# Patient Record
Sex: Female | Born: 1935
Health system: Southern US, Community
[De-identification: ages and names within clinical notes are randomized; demographics above are authoritative.]

## PROBLEM LIST (undated history)

## (undated) DIAGNOSIS — R011 Cardiac murmur, unspecified: Secondary | ICD-10-CM

## (undated) DIAGNOSIS — Z9981 Dependence on supplemental oxygen: Secondary | ICD-10-CM

## (undated) DIAGNOSIS — E119 Type 2 diabetes mellitus without complications: Secondary | ICD-10-CM

## (undated) DIAGNOSIS — C4490 Unspecified malignant neoplasm of skin, unspecified: Secondary | ICD-10-CM

## (undated) DIAGNOSIS — M109 Gout, unspecified: Secondary | ICD-10-CM

## (undated) DIAGNOSIS — M1990 Unspecified osteoarthritis, unspecified site: Secondary | ICD-10-CM

## (undated) DIAGNOSIS — E7800 Pure hypercholesterolemia, unspecified: Secondary | ICD-10-CM

## (undated) DIAGNOSIS — N184 Chronic kidney disease, stage 4 (severe): Secondary | ICD-10-CM

## (undated) DIAGNOSIS — D649 Anemia, unspecified: Secondary | ICD-10-CM

## (undated) DIAGNOSIS — E079 Disorder of thyroid, unspecified: Secondary | ICD-10-CM

## (undated) DIAGNOSIS — I509 Heart failure, unspecified: Secondary | ICD-10-CM

## (undated) DIAGNOSIS — I4891 Unspecified atrial fibrillation: Secondary | ICD-10-CM

## (undated) DIAGNOSIS — I38 Endocarditis, valve unspecified: Secondary | ICD-10-CM

## (undated) DIAGNOSIS — I10 Essential (primary) hypertension: Secondary | ICD-10-CM

## (undated) DIAGNOSIS — J189 Pneumonia, unspecified organism: Secondary | ICD-10-CM

## (undated) DIAGNOSIS — E039 Hypothyroidism, unspecified: Secondary | ICD-10-CM

## (undated) HISTORY — PX: ABDOMINAL HYSTERECTOMY: SHX81

## (undated) HISTORY — PX: TOTAL THYROIDECTOMY: SHX2547

---

## 2011-05-19 DIAGNOSIS — L570 Actinic keratosis: Secondary | ICD-10-CM | POA: Diagnosis not present

## 2011-05-19 DIAGNOSIS — I2510 Atherosclerotic heart disease of native coronary artery without angina pectoris: Secondary | ICD-10-CM | POA: Diagnosis not present

## 2011-06-30 DIAGNOSIS — I369 Nonrheumatic tricuspid valve disorder, unspecified: Secondary | ICD-10-CM | POA: Diagnosis not present

## 2011-06-30 DIAGNOSIS — J984 Other disorders of lung: Secondary | ICD-10-CM | POA: Diagnosis not present

## 2011-06-30 DIAGNOSIS — I059 Rheumatic mitral valve disease, unspecified: Secondary | ICD-10-CM | POA: Diagnosis not present

## 2011-06-30 DIAGNOSIS — I2510 Atherosclerotic heart disease of native coronary artery without angina pectoris: Secondary | ICD-10-CM | POA: Diagnosis not present

## 2011-07-01 DIAGNOSIS — E785 Hyperlipidemia, unspecified: Secondary | ICD-10-CM | POA: Diagnosis not present

## 2011-07-01 DIAGNOSIS — I2510 Atherosclerotic heart disease of native coronary artery without angina pectoris: Secondary | ICD-10-CM | POA: Diagnosis not present

## 2011-07-01 DIAGNOSIS — R0602 Shortness of breath: Secondary | ICD-10-CM | POA: Diagnosis not present

## 2011-07-01 DIAGNOSIS — M259 Joint disorder, unspecified: Secondary | ICD-10-CM | POA: Diagnosis not present

## 2011-07-01 DIAGNOSIS — R079 Chest pain, unspecified: Secondary | ICD-10-CM | POA: Diagnosis not present

## 2011-07-02 DIAGNOSIS — I4949 Other premature depolarization: Secondary | ICD-10-CM | POA: Diagnosis not present

## 2011-07-02 DIAGNOSIS — R002 Palpitations: Secondary | ICD-10-CM | POA: Diagnosis not present

## 2011-07-02 DIAGNOSIS — I491 Atrial premature depolarization: Secondary | ICD-10-CM | POA: Diagnosis not present

## 2011-07-06 DIAGNOSIS — E785 Hyperlipidemia, unspecified: Secondary | ICD-10-CM | POA: Diagnosis not present

## 2011-07-06 DIAGNOSIS — Z8249 Family history of ischemic heart disease and other diseases of the circulatory system: Secondary | ICD-10-CM | POA: Diagnosis not present

## 2011-07-06 DIAGNOSIS — I10 Essential (primary) hypertension: Secondary | ICD-10-CM | POA: Diagnosis not present

## 2011-07-06 DIAGNOSIS — I2510 Atherosclerotic heart disease of native coronary artery without angina pectoris: Secondary | ICD-10-CM | POA: Diagnosis not present

## 2011-07-06 DIAGNOSIS — J9819 Other pulmonary collapse: Secondary | ICD-10-CM | POA: Diagnosis not present

## 2011-07-06 DIAGNOSIS — I517 Cardiomegaly: Secondary | ICD-10-CM | POA: Diagnosis not present

## 2011-07-06 DIAGNOSIS — R079 Chest pain, unspecified: Secondary | ICD-10-CM | POA: Diagnosis not present

## 2011-07-11 DIAGNOSIS — N189 Chronic kidney disease, unspecified: Secondary | ICD-10-CM | POA: Diagnosis not present

## 2011-07-11 DIAGNOSIS — E785 Hyperlipidemia, unspecified: Secondary | ICD-10-CM | POA: Diagnosis not present

## 2011-07-11 DIAGNOSIS — E119 Type 2 diabetes mellitus without complications: Secondary | ICD-10-CM | POA: Diagnosis not present

## 2011-07-11 DIAGNOSIS — I129 Hypertensive chronic kidney disease with stage 1 through stage 4 chronic kidney disease, or unspecified chronic kidney disease: Secondary | ICD-10-CM | POA: Diagnosis not present

## 2011-07-18 DIAGNOSIS — E782 Mixed hyperlipidemia: Secondary | ICD-10-CM | POA: Diagnosis not present

## 2011-07-18 DIAGNOSIS — E119 Type 2 diabetes mellitus without complications: Secondary | ICD-10-CM | POA: Diagnosis not present

## 2011-07-18 DIAGNOSIS — E039 Hypothyroidism, unspecified: Secondary | ICD-10-CM | POA: Diagnosis not present

## 2011-07-18 DIAGNOSIS — I10 Essential (primary) hypertension: Secondary | ICD-10-CM | POA: Diagnosis not present

## 2011-08-01 DIAGNOSIS — I2510 Atherosclerotic heart disease of native coronary artery without angina pectoris: Secondary | ICD-10-CM | POA: Diagnosis not present

## 2011-08-01 DIAGNOSIS — E119 Type 2 diabetes mellitus without complications: Secondary | ICD-10-CM | POA: Diagnosis not present

## 2011-08-01 DIAGNOSIS — I10 Essential (primary) hypertension: Secondary | ICD-10-CM | POA: Diagnosis not present

## 2011-08-04 DIAGNOSIS — R079 Chest pain, unspecified: Secondary | ICD-10-CM | POA: Diagnosis not present

## 2011-08-22 DIAGNOSIS — J449 Chronic obstructive pulmonary disease, unspecified: Secondary | ICD-10-CM | POA: Diagnosis not present

## 2011-08-22 DIAGNOSIS — J189 Pneumonia, unspecified organism: Secondary | ICD-10-CM | POA: Diagnosis present

## 2011-08-22 DIAGNOSIS — J441 Chronic obstructive pulmonary disease with (acute) exacerbation: Secondary | ICD-10-CM | POA: Diagnosis not present

## 2011-08-22 DIAGNOSIS — I10 Essential (primary) hypertension: Secondary | ICD-10-CM | POA: Diagnosis not present

## 2011-08-22 DIAGNOSIS — E119 Type 2 diabetes mellitus without complications: Secondary | ICD-10-CM | POA: Diagnosis not present

## 2011-08-22 DIAGNOSIS — M1990 Unspecified osteoarthritis, unspecified site: Secondary | ICD-10-CM | POA: Diagnosis present

## 2011-08-22 DIAGNOSIS — K5900 Constipation, unspecified: Secondary | ICD-10-CM | POA: Diagnosis not present

## 2011-08-22 DIAGNOSIS — E039 Hypothyroidism, unspecified: Secondary | ICD-10-CM | POA: Diagnosis present

## 2011-08-27 DIAGNOSIS — N189 Chronic kidney disease, unspecified: Secondary | ICD-10-CM | POA: Diagnosis present

## 2011-08-27 DIAGNOSIS — I509 Heart failure, unspecified: Secondary | ICD-10-CM | POA: Diagnosis not present

## 2011-08-27 DIAGNOSIS — J441 Chronic obstructive pulmonary disease with (acute) exacerbation: Secondary | ICD-10-CM | POA: Diagnosis not present

## 2011-08-27 DIAGNOSIS — R05 Cough: Secondary | ICD-10-CM | POA: Diagnosis not present

## 2011-08-27 DIAGNOSIS — I519 Heart disease, unspecified: Secondary | ICD-10-CM | POA: Diagnosis not present

## 2011-08-27 DIAGNOSIS — J449 Chronic obstructive pulmonary disease, unspecified: Secondary | ICD-10-CM | POA: Diagnosis not present

## 2011-08-27 DIAGNOSIS — I2510 Atherosclerotic heart disease of native coronary artery without angina pectoris: Secondary | ICD-10-CM | POA: Diagnosis present

## 2011-08-27 DIAGNOSIS — I4891 Unspecified atrial fibrillation: Secondary | ICD-10-CM | POA: Diagnosis not present

## 2011-08-27 DIAGNOSIS — M1990 Unspecified osteoarthritis, unspecified site: Secondary | ICD-10-CM | POA: Diagnosis present

## 2011-08-27 DIAGNOSIS — I499 Cardiac arrhythmia, unspecified: Secondary | ICD-10-CM | POA: Diagnosis not present

## 2011-08-27 DIAGNOSIS — N289 Disorder of kidney and ureter, unspecified: Secondary | ICD-10-CM | POA: Diagnosis not present

## 2011-08-27 DIAGNOSIS — R9431 Abnormal electrocardiogram [ECG] [EKG]: Secondary | ICD-10-CM | POA: Diagnosis not present

## 2011-08-27 DIAGNOSIS — K5900 Constipation, unspecified: Secondary | ICD-10-CM | POA: Diagnosis not present

## 2011-08-27 DIAGNOSIS — R0609 Other forms of dyspnea: Secondary | ICD-10-CM | POA: Diagnosis not present

## 2011-08-27 DIAGNOSIS — R0602 Shortness of breath: Secondary | ICD-10-CM | POA: Diagnosis not present

## 2011-08-27 DIAGNOSIS — E119 Type 2 diabetes mellitus without complications: Secondary | ICD-10-CM | POA: Diagnosis present

## 2011-08-27 DIAGNOSIS — J4489 Other specified chronic obstructive pulmonary disease: Secondary | ICD-10-CM | POA: Diagnosis not present

## 2011-08-27 DIAGNOSIS — E785 Hyperlipidemia, unspecified: Secondary | ICD-10-CM | POA: Diagnosis present

## 2011-08-27 DIAGNOSIS — R5381 Other malaise: Secondary | ICD-10-CM | POA: Diagnosis not present

## 2011-08-27 DIAGNOSIS — K5909 Other constipation: Secondary | ICD-10-CM | POA: Diagnosis present

## 2011-08-27 DIAGNOSIS — E860 Dehydration: Secondary | ICD-10-CM | POA: Diagnosis present

## 2011-08-27 DIAGNOSIS — I129 Hypertensive chronic kidney disease with stage 1 through stage 4 chronic kidney disease, or unspecified chronic kidney disease: Secondary | ICD-10-CM | POA: Diagnosis present

## 2011-08-27 DIAGNOSIS — I498 Other specified cardiac arrhythmias: Secondary | ICD-10-CM | POA: Diagnosis not present

## 2011-08-27 DIAGNOSIS — Z87891 Personal history of nicotine dependence: Secondary | ICD-10-CM | POA: Diagnosis not present

## 2011-08-31 DIAGNOSIS — R5381 Other malaise: Secondary | ICD-10-CM | POA: Diagnosis not present

## 2011-08-31 DIAGNOSIS — I509 Heart failure, unspecified: Secondary | ICD-10-CM | POA: Diagnosis not present

## 2011-08-31 DIAGNOSIS — M1990 Unspecified osteoarthritis, unspecified site: Secondary | ICD-10-CM | POA: Diagnosis not present

## 2011-08-31 DIAGNOSIS — E119 Type 2 diabetes mellitus without complications: Secondary | ICD-10-CM | POA: Diagnosis not present

## 2011-08-31 DIAGNOSIS — R5383 Other fatigue: Secondary | ICD-10-CM | POA: Diagnosis not present

## 2011-08-31 DIAGNOSIS — E7800 Pure hypercholesterolemia, unspecified: Secondary | ICD-10-CM | POA: Diagnosis not present

## 2011-08-31 DIAGNOSIS — J441 Chronic obstructive pulmonary disease with (acute) exacerbation: Secondary | ICD-10-CM | POA: Diagnosis not present

## 2011-09-02 DIAGNOSIS — I4891 Unspecified atrial fibrillation: Secondary | ICD-10-CM | POA: Diagnosis not present

## 2011-09-02 DIAGNOSIS — Z7901 Long term (current) use of anticoagulants: Secondary | ICD-10-CM | POA: Diagnosis not present

## 2011-09-02 DIAGNOSIS — Z5181 Encounter for therapeutic drug level monitoring: Secondary | ICD-10-CM | POA: Diagnosis not present

## 2011-09-07 DIAGNOSIS — I498 Other specified cardiac arrhythmias: Secondary | ICD-10-CM | POA: Diagnosis not present

## 2011-09-07 DIAGNOSIS — I10 Essential (primary) hypertension: Secondary | ICD-10-CM | POA: Diagnosis not present

## 2011-09-07 DIAGNOSIS — I059 Rheumatic mitral valve disease, unspecified: Secondary | ICD-10-CM | POA: Diagnosis not present

## 2011-09-07 DIAGNOSIS — I4891 Unspecified atrial fibrillation: Secondary | ICD-10-CM | POA: Diagnosis not present

## 2011-09-07 DIAGNOSIS — I509 Heart failure, unspecified: Secondary | ICD-10-CM | POA: Diagnosis not present

## 2011-09-07 DIAGNOSIS — R609 Edema, unspecified: Secondary | ICD-10-CM | POA: Diagnosis not present

## 2011-09-09 DIAGNOSIS — R5383 Other fatigue: Secondary | ICD-10-CM | POA: Diagnosis not present

## 2011-09-09 DIAGNOSIS — Z23 Encounter for immunization: Secondary | ICD-10-CM | POA: Diagnosis not present

## 2011-09-09 DIAGNOSIS — N189 Chronic kidney disease, unspecified: Secondary | ICD-10-CM | POA: Diagnosis not present

## 2011-09-09 DIAGNOSIS — I10 Essential (primary) hypertension: Secondary | ICD-10-CM | POA: Diagnosis not present

## 2011-09-09 DIAGNOSIS — K219 Gastro-esophageal reflux disease without esophagitis: Secondary | ICD-10-CM | POA: Diagnosis not present

## 2011-09-09 DIAGNOSIS — D6489 Other specified anemias: Secondary | ICD-10-CM | POA: Diagnosis not present

## 2011-09-09 DIAGNOSIS — E039 Hypothyroidism, unspecified: Secondary | ICD-10-CM | POA: Diagnosis not present

## 2011-09-09 DIAGNOSIS — E119 Type 2 diabetes mellitus without complications: Secondary | ICD-10-CM | POA: Diagnosis not present

## 2011-09-09 DIAGNOSIS — E669 Obesity, unspecified: Secondary | ICD-10-CM | POA: Diagnosis not present

## 2011-09-09 DIAGNOSIS — Z7901 Long term (current) use of anticoagulants: Secondary | ICD-10-CM | POA: Diagnosis not present

## 2011-09-09 DIAGNOSIS — R5381 Other malaise: Secondary | ICD-10-CM | POA: Diagnosis not present

## 2011-09-09 DIAGNOSIS — Z79899 Other long term (current) drug therapy: Secondary | ICD-10-CM | POA: Diagnosis not present

## 2011-09-14 DIAGNOSIS — I498 Other specified cardiac arrhythmias: Secondary | ICD-10-CM | POA: Diagnosis not present

## 2011-09-14 DIAGNOSIS — I509 Heart failure, unspecified: Secondary | ICD-10-CM | POA: Diagnosis not present

## 2011-09-14 DIAGNOSIS — J449 Chronic obstructive pulmonary disease, unspecified: Secondary | ICD-10-CM | POA: Diagnosis not present

## 2011-09-14 DIAGNOSIS — I4891 Unspecified atrial fibrillation: Secondary | ICD-10-CM | POA: Diagnosis not present

## 2011-09-15 DIAGNOSIS — I4891 Unspecified atrial fibrillation: Secondary | ICD-10-CM | POA: Diagnosis not present

## 2011-09-16 DIAGNOSIS — H2589 Other age-related cataract: Secondary | ICD-10-CM | POA: Diagnosis not present

## 2011-09-16 DIAGNOSIS — E119 Type 2 diabetes mellitus without complications: Secondary | ICD-10-CM | POA: Diagnosis not present

## 2011-09-16 DIAGNOSIS — H04129 Dry eye syndrome of unspecified lacrimal gland: Secondary | ICD-10-CM | POA: Diagnosis not present

## 2011-09-22 DIAGNOSIS — I4891 Unspecified atrial fibrillation: Secondary | ICD-10-CM | POA: Diagnosis not present

## 2011-09-28 DIAGNOSIS — I509 Heart failure, unspecified: Secondary | ICD-10-CM | POA: Diagnosis not present

## 2011-09-28 DIAGNOSIS — I10 Essential (primary) hypertension: Secondary | ICD-10-CM | POA: Diagnosis not present

## 2011-09-28 DIAGNOSIS — I4891 Unspecified atrial fibrillation: Secondary | ICD-10-CM | POA: Diagnosis not present

## 2011-09-28 DIAGNOSIS — I059 Rheumatic mitral valve disease, unspecified: Secondary | ICD-10-CM | POA: Diagnosis not present

## 2011-09-29 DIAGNOSIS — N184 Chronic kidney disease, stage 4 (severe): Secondary | ICD-10-CM | POA: Diagnosis not present

## 2011-10-11 DIAGNOSIS — I4891 Unspecified atrial fibrillation: Secondary | ICD-10-CM | POA: Diagnosis not present

## 2011-10-14 DIAGNOSIS — I10 Essential (primary) hypertension: Secondary | ICD-10-CM | POA: Diagnosis not present

## 2011-10-14 DIAGNOSIS — Z79899 Other long term (current) drug therapy: Secondary | ICD-10-CM | POA: Diagnosis not present

## 2011-10-14 DIAGNOSIS — Z1331 Encounter for screening for depression: Secondary | ICD-10-CM | POA: Diagnosis not present

## 2011-10-14 DIAGNOSIS — Z1339 Encounter for screening examination for other mental health and behavioral disorders: Secondary | ICD-10-CM | POA: Diagnosis not present

## 2011-10-14 DIAGNOSIS — E119 Type 2 diabetes mellitus without complications: Secondary | ICD-10-CM | POA: Diagnosis not present

## 2011-10-14 DIAGNOSIS — Z136 Encounter for screening for cardiovascular disorders: Secondary | ICD-10-CM | POA: Diagnosis not present

## 2011-10-14 DIAGNOSIS — E785 Hyperlipidemia, unspecified: Secondary | ICD-10-CM | POA: Diagnosis not present

## 2011-10-14 DIAGNOSIS — D508 Other iron deficiency anemias: Secondary | ICD-10-CM | POA: Diagnosis not present

## 2011-10-15 DIAGNOSIS — I509 Heart failure, unspecified: Secondary | ICD-10-CM | POA: Diagnosis not present

## 2011-10-15 DIAGNOSIS — M1990 Unspecified osteoarthritis, unspecified site: Secondary | ICD-10-CM | POA: Diagnosis not present

## 2011-10-15 DIAGNOSIS — J441 Chronic obstructive pulmonary disease with (acute) exacerbation: Secondary | ICD-10-CM | POA: Diagnosis not present

## 2011-10-15 DIAGNOSIS — E119 Type 2 diabetes mellitus without complications: Secondary | ICD-10-CM | POA: Diagnosis not present

## 2011-10-25 DIAGNOSIS — I4891 Unspecified atrial fibrillation: Secondary | ICD-10-CM | POA: Diagnosis not present

## 2011-11-14 DIAGNOSIS — I517 Cardiomegaly: Secondary | ICD-10-CM | POA: Diagnosis not present

## 2011-11-14 DIAGNOSIS — I4891 Unspecified atrial fibrillation: Secondary | ICD-10-CM | POA: Diagnosis not present

## 2011-11-14 DIAGNOSIS — I059 Rheumatic mitral valve disease, unspecified: Secondary | ICD-10-CM | POA: Diagnosis not present

## 2011-11-14 DIAGNOSIS — I080 Rheumatic disorders of both mitral and aortic valves: Secondary | ICD-10-CM | POA: Diagnosis not present

## 2011-11-14 DIAGNOSIS — I079 Rheumatic tricuspid valve disease, unspecified: Secondary | ICD-10-CM | POA: Diagnosis not present

## 2011-11-14 DIAGNOSIS — R002 Palpitations: Secondary | ICD-10-CM | POA: Diagnosis not present

## 2011-11-21 DIAGNOSIS — R002 Palpitations: Secondary | ICD-10-CM | POA: Diagnosis not present

## 2011-11-23 DIAGNOSIS — I509 Heart failure, unspecified: Secondary | ICD-10-CM | POA: Diagnosis not present

## 2011-11-23 DIAGNOSIS — I059 Rheumatic mitral valve disease, unspecified: Secondary | ICD-10-CM | POA: Diagnosis not present

## 2011-11-23 DIAGNOSIS — I4891 Unspecified atrial fibrillation: Secondary | ICD-10-CM | POA: Diagnosis not present

## 2011-11-23 DIAGNOSIS — I10 Essential (primary) hypertension: Secondary | ICD-10-CM | POA: Diagnosis not present

## 2011-11-23 DIAGNOSIS — I2789 Other specified pulmonary heart diseases: Secondary | ICD-10-CM | POA: Diagnosis not present

## 2011-12-15 DIAGNOSIS — I4891 Unspecified atrial fibrillation: Secondary | ICD-10-CM | POA: Diagnosis not present

## 2011-12-19 DIAGNOSIS — N184 Chronic kidney disease, stage 4 (severe): Secondary | ICD-10-CM | POA: Diagnosis not present

## 2011-12-26 DIAGNOSIS — N2581 Secondary hyperparathyroidism of renal origin: Secondary | ICD-10-CM | POA: Diagnosis not present

## 2011-12-26 DIAGNOSIS — N185 Chronic kidney disease, stage 5: Secondary | ICD-10-CM | POA: Diagnosis not present

## 2011-12-26 DIAGNOSIS — N184 Chronic kidney disease, stage 4 (severe): Secondary | ICD-10-CM | POA: Diagnosis not present

## 2011-12-26 DIAGNOSIS — D649 Anemia, unspecified: Secondary | ICD-10-CM | POA: Diagnosis not present

## 2012-01-05 DIAGNOSIS — N184 Chronic kidney disease, stage 4 (severe): Secondary | ICD-10-CM | POA: Diagnosis not present

## 2012-01-06 DIAGNOSIS — I4891 Unspecified atrial fibrillation: Secondary | ICD-10-CM | POA: Diagnosis not present

## 2012-01-11 ENCOUNTER — Other Ambulatory Visit: Payer: Self-pay | Admitting: Nephrology

## 2012-01-11 DIAGNOSIS — N1330 Unspecified hydronephrosis: Secondary | ICD-10-CM

## 2012-01-12 ENCOUNTER — Ambulatory Visit
Admission: RE | Admit: 2012-01-12 | Discharge: 2012-01-12 | Disposition: A | Discharge status: Home / Self Care | Payer: Medicare Other | Source: Ambulatory Visit | Attending: Nephrology | Admitting: Nephrology

## 2012-01-12 DIAGNOSIS — N1330 Unspecified hydronephrosis: Secondary | ICD-10-CM | POA: Diagnosis not present

## 2012-01-16 DIAGNOSIS — N1330 Unspecified hydronephrosis: Secondary | ICD-10-CM | POA: Diagnosis not present

## 2012-01-16 DIAGNOSIS — N281 Cyst of kidney, acquired: Secondary | ICD-10-CM | POA: Diagnosis not present

## 2012-02-02 DIAGNOSIS — I4891 Unspecified atrial fibrillation: Secondary | ICD-10-CM | POA: Diagnosis not present

## 2012-02-04 ENCOUNTER — Emergency Department (HOSPITAL_BASED_OUTPATIENT_CLINIC_OR_DEPARTMENT_OTHER): Payer: Medicare Other

## 2012-02-04 ENCOUNTER — Encounter (HOSPITAL_BASED_OUTPATIENT_CLINIC_OR_DEPARTMENT_OTHER): Payer: Self-pay | Admitting: *Deleted

## 2012-02-04 ENCOUNTER — Emergency Department (HOSPITAL_BASED_OUTPATIENT_CLINIC_OR_DEPARTMENT_OTHER)
Admission: EM | Admit: 2012-02-04 | Discharge: 2012-02-04 | Disposition: A | Discharge status: Home / Self Care | Payer: Medicare Other | Attending: Emergency Medicine | Admitting: Emergency Medicine

## 2012-02-04 DIAGNOSIS — Z7901 Long term (current) use of anticoagulants: Secondary | ICD-10-CM | POA: Diagnosis not present

## 2012-02-04 DIAGNOSIS — Y9301 Activity, walking, marching and hiking: Secondary | ICD-10-CM | POA: Insufficient documentation

## 2012-02-04 DIAGNOSIS — I509 Heart failure, unspecified: Secondary | ICD-10-CM | POA: Diagnosis not present

## 2012-02-04 DIAGNOSIS — S5010XA Contusion of unspecified forearm, initial encounter: Secondary | ICD-10-CM | POA: Diagnosis not present

## 2012-02-04 DIAGNOSIS — M79609 Pain in unspecified limb: Secondary | ICD-10-CM | POA: Insufficient documentation

## 2012-02-04 DIAGNOSIS — E079 Disorder of thyroid, unspecified: Secondary | ICD-10-CM | POA: Insufficient documentation

## 2012-02-04 DIAGNOSIS — S0993XA Unspecified injury of face, initial encounter: Secondary | ICD-10-CM | POA: Diagnosis not present

## 2012-02-04 DIAGNOSIS — I10 Essential (primary) hypertension: Secondary | ICD-10-CM | POA: Insufficient documentation

## 2012-02-04 DIAGNOSIS — T148XXA Other injury of unspecified body region, initial encounter: Secondary | ICD-10-CM

## 2012-02-04 DIAGNOSIS — E119 Type 2 diabetes mellitus without complications: Secondary | ICD-10-CM | POA: Insufficient documentation

## 2012-02-04 DIAGNOSIS — S0003XA Contusion of scalp, initial encounter: Secondary | ICD-10-CM | POA: Diagnosis not present

## 2012-02-04 DIAGNOSIS — S199XXA Unspecified injury of neck, initial encounter: Secondary | ICD-10-CM | POA: Diagnosis not present

## 2012-02-04 DIAGNOSIS — W19XXXA Unspecified fall, initial encounter: Secondary | ICD-10-CM | POA: Insufficient documentation

## 2012-02-04 DIAGNOSIS — S1093XA Contusion of unspecified part of neck, initial encounter: Secondary | ICD-10-CM | POA: Diagnosis not present

## 2012-02-04 HISTORY — DX: Disorder of thyroid, unspecified: E07.9

## 2012-02-04 HISTORY — DX: Heart failure, unspecified: I50.9

## 2012-02-04 HISTORY — DX: Essential (primary) hypertension: I10

## 2012-02-04 LAB — PROTIME-INR: INR: 1.9 — ABNORMAL HIGH (ref 0.00–1.49)

## 2012-02-04 MED ORDER — ACETAMINOPHEN 325 MG PO TABS
650.0000 mg | ORAL_TABLET | Freq: Once | ORAL | Status: AC
  Administered 2012-02-04: 650 mg via ORAL
  Filled 2012-02-04: qty 2

## 2012-02-04 NOTE — ED Notes (Signed)
Report recd from [facility]

## 2012-02-04 NOTE — ED Provider Notes (Addendum)
History  This chart was scribed for Madeline Dessert, MD by Roe Coombs. The patient was seen in room MH12/MH12. Patient's care was started at 1758.     CSN: [id number]  Arrival date & time 02/04/12  1731   First MD Initiated Contact with Patient 02/04/12 1758      Chief Complaint  Patient presents with  . Fall   Patient is a 78 y.o. female presenting with fall. The history is provided by the patient. No language interpreter was used.  Fall The accident occurred 1 to 2 hours ago. The fall occurred while walking. She fell from a height of 1 to 2 ft. The point of impact was the head (and right arm). The pain is moderate. She was ambulatory at the scene. Pertinent negatives include no loss of consciousness. She has tried nothing for the symptoms.    HPI Comments Madeline [name] is a 78 y.o. female who presents to the Emergency Department complaining of moderate, constant right forearm and moderate, constant, frontal HA pain resulting from a fall that occurred 1-2 hours ago. Patient states that she was trying to get up from her chair when she had a misstep and fell to the floor. Patient denies any pain in her legs, knees or hips and denies LOC. She states that she was ambulatory after the fall. Patient is on Coumadin.    Past Medical History  Diagnosis Date  . Hypertension   . Diabetes mellitus   . CHF (congestive heart failure)   . Thyroid disease     Past Surgical History  Procedure Date  . Abdominal hysterectomy   . Thyroid surgery     History reviewed. No pertinent family history.  History  Substance Use Topics  . Smoking status: Never Smoker   . Smokeless tobacco: Not on file  . Alcohol Use: No    OB History    Grav Para Term Preterm Abortions TAB SAB Ect Mult Living                  Review of Systems  Constitutional: Negative.   HENT: Negative.   Eyes: Negative.   Respiratory: Negative.   Cardiovascular: Negative.   Gastrointestinal: Negative.     Musculoskeletal: Positive for myalgias.  Skin: Positive for color change and wound.  Neurological: Negative.  Negative for loss of consciousness.  Psychiatric/Behavioral: Negative.     Allergies  Review of patient's allergies indicates no known allergies.  Home Medications  No current outpatient prescriptions on file.  BP 130/56  Pulse 52  Temp 98.1 F (36.7 C) (Oral)  Resp 20  Ht 5\' 5"  (1.651 m)  Wt 209 lb (94.802 kg)  BMI 34.78 kg/m2  SpO2 95%  Physical Exam  Constitutional: She is oriented to person, place, and time. She appears well-developed and well-nourished.  HENT:  Head: Normocephalic.       Hematoma over the mid forehead.  Eyes: Pupils are equal, round, and reactive to light.  Cardiovascular: Normal rate and regular rhythm.   No murmur heard. Pulmonary/Chest: Effort normal and breath sounds normal. No respiratory distress.  Musculoskeletal: Normal range of motion.       Right forearm: She exhibits tenderness. She exhibits no deformity.       Hematoma over the right forearm. No deformity. Mild tenderness. Neurovascularly intact distally without wrist or elbow tenderness.  Neurological: She is alert and oriented to person, place, and time.  Skin: Skin is warm and dry.  Psychiatric: She has a normal mood  and affect. Her behavior is normal.    ED Course  Procedures (including critical care time) DIAGNOSTIC STUDIES: Oxygen Saturation is 95% on room air, adequate by my interpretation.    COORDINATION OF CARE: 6:13pm- Patient informed of current plan for treatment and evaluation and agrees with plan at this time.      Labs Reviewed  PROTIME-INR - Abnormal; Notable for the following:    Prothrombin Time 21.1 (*)     INR 1.90 (*)     All other components within normal limits   Ct Head Wo Contrast  02/04/2012  *RADIOLOGY REPORT*  Clinical Data: [place], pain in the head.  Anticoagulated.  CT HEAD WITHOUT CONTRAST  Technique:  Contiguous axial images were  obtained from the base of the skull through the vertex without contrast.  Comparison: None.  Findings: There is no evidence for acute infarction, intracranial hemorrhage, mass lesion, hydrocephalus, or extra-axial fluid. Moderate atrophy.  Chronic microvascular ischemic change.  Right frontal scalp hematoma.  No skull fracture.  No contrecoup injury. Clear sinuses and mastoids.  Negative orbits.  IMPRESSION: Right frontal scalp hematoma.  No underlying skull fracture or intracranial hemorrhage.   Original Report Authenticated By: Staci Righter, M.D.      1. Fall   2. Hematoma       MDM   Patient with a mechanical fall today hitting her head. Patient is taking Coumadin. Coumadin level was last checked on Thursday was 1.8 and her level was increased. Patient denies any LOC but does have a significant hematoma on her forehead. No C-spine tenderness and a small hematoma to the right arm. No concern for fracture of the forearm. CT of the head and INR pending. Patient was able to ambulate without difficulty.  7:10 PM InR is 1.9.  CT neg.  Pt d/ced home.    I personally performed the services described in this documentation, which was scribed in my presence.  The recorded information has been reviewed and considered.    Madeline Dessert, MD 02/04/12 [id number]  Madeline Dessert, MD 02/04/12 [id number]

## 2012-02-04 NOTE — ED Notes (Signed)
Pt states she fell between the couch and a chair. Hematoma to right forearm and head. Ice applied. No LOC. Pt is on Warfarin.

## 2012-02-23 DIAGNOSIS — I2510 Atherosclerotic heart disease of native coronary artery without angina pectoris: Secondary | ICD-10-CM | POA: Diagnosis not present

## 2012-02-23 DIAGNOSIS — L80 Vitiligo: Secondary | ICD-10-CM | POA: Diagnosis not present

## 2012-02-23 DIAGNOSIS — IMO0001 Reserved for inherently not codable concepts without codable children: Secondary | ICD-10-CM | POA: Diagnosis not present

## 2012-02-23 DIAGNOSIS — E890 Postprocedural hypothyroidism: Secondary | ICD-10-CM | POA: Diagnosis not present

## 2012-02-23 DIAGNOSIS — I509 Heart failure, unspecified: Secondary | ICD-10-CM | POA: Diagnosis not present

## 2012-02-23 DIAGNOSIS — Z1331 Encounter for screening for depression: Secondary | ICD-10-CM | POA: Diagnosis not present

## 2012-02-23 DIAGNOSIS — I10 Essential (primary) hypertension: Secondary | ICD-10-CM | POA: Diagnosis not present

## 2012-02-28 DIAGNOSIS — Z23 Encounter for immunization: Secondary | ICD-10-CM | POA: Diagnosis not present

## 2012-03-02 DIAGNOSIS — H01009 Unspecified blepharitis unspecified eye, unspecified eyelid: Secondary | ICD-10-CM | POA: Diagnosis not present

## 2012-03-05 DIAGNOSIS — H00019 Hordeolum externum unspecified eye, unspecified eyelid: Secondary | ICD-10-CM | POA: Diagnosis not present

## 2012-03-15 DIAGNOSIS — I4891 Unspecified atrial fibrillation: Secondary | ICD-10-CM | POA: Diagnosis not present

## 2012-03-30 DIAGNOSIS — I509 Heart failure, unspecified: Secondary | ICD-10-CM | POA: Diagnosis not present

## 2012-03-30 DIAGNOSIS — I2510 Atherosclerotic heart disease of native coronary artery without angina pectoris: Secondary | ICD-10-CM | POA: Diagnosis not present

## 2012-03-30 DIAGNOSIS — I4891 Unspecified atrial fibrillation: Secondary | ICD-10-CM | POA: Diagnosis not present

## 2012-03-30 DIAGNOSIS — I5032 Chronic diastolic (congestive) heart failure: Secondary | ICD-10-CM | POA: Diagnosis not present

## 2012-03-30 DIAGNOSIS — Z7901 Long term (current) use of anticoagulants: Secondary | ICD-10-CM | POA: Diagnosis not present

## 2012-04-04 DIAGNOSIS — I509 Heart failure, unspecified: Secondary | ICD-10-CM | POA: Diagnosis not present

## 2012-04-04 DIAGNOSIS — D649 Anemia, unspecified: Secondary | ICD-10-CM | POA: Diagnosis not present

## 2012-04-19 DIAGNOSIS — L299 Pruritus, unspecified: Secondary | ICD-10-CM | POA: Diagnosis not present

## 2012-04-19 DIAGNOSIS — L538 Other specified erythematous conditions: Secondary | ICD-10-CM | POA: Diagnosis not present

## 2012-04-19 DIAGNOSIS — IMO0001 Reserved for inherently not codable concepts without codable children: Secondary | ICD-10-CM | POA: Diagnosis not present

## 2012-05-23 DIAGNOSIS — Z7901 Long term (current) use of anticoagulants: Secondary | ICD-10-CM | POA: Diagnosis not present

## 2012-05-23 DIAGNOSIS — I4891 Unspecified atrial fibrillation: Secondary | ICD-10-CM | POA: Diagnosis not present

## 2012-06-04 DIAGNOSIS — I5032 Chronic diastolic (congestive) heart failure: Secondary | ICD-10-CM | POA: Diagnosis not present

## 2012-06-04 DIAGNOSIS — I2510 Atherosclerotic heart disease of native coronary artery without angina pectoris: Secondary | ICD-10-CM | POA: Diagnosis not present

## 2012-06-04 DIAGNOSIS — Z7901 Long term (current) use of anticoagulants: Secondary | ICD-10-CM | POA: Diagnosis not present

## 2012-06-04 DIAGNOSIS — I4891 Unspecified atrial fibrillation: Secondary | ICD-10-CM | POA: Diagnosis not present

## 2012-07-18 DIAGNOSIS — I4891 Unspecified atrial fibrillation: Secondary | ICD-10-CM | POA: Diagnosis not present

## 2012-07-18 DIAGNOSIS — Z7901 Long term (current) use of anticoagulants: Secondary | ICD-10-CM | POA: Diagnosis not present

## 2012-07-19 DIAGNOSIS — I10 Essential (primary) hypertension: Secondary | ICD-10-CM | POA: Diagnosis not present

## 2012-07-19 DIAGNOSIS — IMO0001 Reserved for inherently not codable concepts without codable children: Secondary | ICD-10-CM | POA: Diagnosis not present

## 2012-07-19 DIAGNOSIS — E890 Postprocedural hypothyroidism: Secondary | ICD-10-CM | POA: Diagnosis not present

## 2012-07-19 DIAGNOSIS — M1710 Unilateral primary osteoarthritis, unspecified knee: Secondary | ICD-10-CM | POA: Diagnosis not present

## 2012-08-25 DIAGNOSIS — J209 Acute bronchitis, unspecified: Secondary | ICD-10-CM | POA: Diagnosis not present

## 2012-08-30 DIAGNOSIS — J209 Acute bronchitis, unspecified: Secondary | ICD-10-CM | POA: Diagnosis not present

## 2012-08-30 DIAGNOSIS — R609 Edema, unspecified: Secondary | ICD-10-CM | POA: Diagnosis not present

## 2012-09-03 DIAGNOSIS — I4891 Unspecified atrial fibrillation: Secondary | ICD-10-CM | POA: Diagnosis not present

## 2012-09-03 DIAGNOSIS — Z7901 Long term (current) use of anticoagulants: Secondary | ICD-10-CM | POA: Diagnosis not present

## 2012-10-03 ENCOUNTER — Inpatient Hospital Stay (HOSPITAL_BASED_OUTPATIENT_CLINIC_OR_DEPARTMENT_OTHER)
Admission: EM | Admit: 2012-10-03 | Discharge: 2012-10-05 | DRG: 683 | Disposition: A | Discharge status: Home Health | Payer: Medicare Other | Attending: Internal Medicine | Admitting: Internal Medicine

## 2012-10-03 ENCOUNTER — Emergency Department (HOSPITAL_BASED_OUTPATIENT_CLINIC_OR_DEPARTMENT_OTHER): Payer: Medicare Other

## 2012-10-03 ENCOUNTER — Encounter (HOSPITAL_BASED_OUTPATIENT_CLINIC_OR_DEPARTMENT_OTHER): Payer: Self-pay | Admitting: *Deleted

## 2012-10-03 DIAGNOSIS — R5381 Other malaise: Secondary | ICD-10-CM | POA: Diagnosis not present

## 2012-10-03 DIAGNOSIS — Z7901 Long term (current) use of anticoagulants: Secondary | ICD-10-CM | POA: Diagnosis not present

## 2012-10-03 DIAGNOSIS — I4891 Unspecified atrial fibrillation: Secondary | ICD-10-CM | POA: Diagnosis present

## 2012-10-03 DIAGNOSIS — D649 Anemia, unspecified: Secondary | ICD-10-CM | POA: Diagnosis present

## 2012-10-03 DIAGNOSIS — N189 Chronic kidney disease, unspecified: Secondary | ICD-10-CM | POA: Diagnosis present

## 2012-10-03 DIAGNOSIS — R002 Palpitations: Secondary | ICD-10-CM | POA: Diagnosis not present

## 2012-10-03 DIAGNOSIS — I129 Hypertensive chronic kidney disease with stage 1 through stage 4 chronic kidney disease, or unspecified chronic kidney disease: Secondary | ICD-10-CM | POA: Diagnosis present

## 2012-10-03 DIAGNOSIS — N179 Acute kidney failure, unspecified: Secondary | ICD-10-CM

## 2012-10-03 DIAGNOSIS — E119 Type 2 diabetes mellitus without complications: Secondary | ICD-10-CM | POA: Diagnosis not present

## 2012-10-03 DIAGNOSIS — I509 Heart failure, unspecified: Secondary | ICD-10-CM | POA: Diagnosis present

## 2012-10-03 DIAGNOSIS — E039 Hypothyroidism, unspecified: Secondary | ICD-10-CM

## 2012-10-03 DIAGNOSIS — N289 Disorder of kidney and ureter, unspecified: Secondary | ICD-10-CM | POA: Diagnosis not present

## 2012-10-03 DIAGNOSIS — Z794 Long term (current) use of insulin: Secondary | ICD-10-CM

## 2012-10-03 DIAGNOSIS — N19 Unspecified kidney failure: Secondary | ICD-10-CM | POA: Diagnosis not present

## 2012-10-03 DIAGNOSIS — N390 Urinary tract infection, site not specified: Secondary | ICD-10-CM | POA: Diagnosis present

## 2012-10-03 DIAGNOSIS — R5383 Other fatigue: Secondary | ICD-10-CM | POA: Diagnosis present

## 2012-10-03 HISTORY — DX: Unspecified atrial fibrillation: I48.91

## 2012-10-03 LAB — COMPREHENSIVE METABOLIC PANEL
ALT: 19 U/L (ref 0–35)
AST: 24 U/L (ref 0–37)
Albumin: 3.6 g/dL (ref 3.5–5.2)
Calcium: 10 mg/dL (ref 8.4–10.5)
GFR calc Af Amer: 15 mL/min — ABNORMAL LOW (ref >90)
Sodium: 142 mEq/L (ref 135–145)
Total Protein: 7.4 g/dL (ref 6.0–8.3)

## 2012-10-03 LAB — URINALYSIS, ROUTINE W REFLEX MICROSCOPIC
Bilirubin Urine: NEGATIVE
Nitrite: NEGATIVE
Specific Gravity, Urine: 1.01 (ref 1.005–1.030)
Urobilinogen, UA: 0.2 mg/dL (ref 0.0–1.0)

## 2012-10-03 LAB — CBC WITH DIFFERENTIAL/PLATELET
Basophils Absolute: 0 10*3/uL (ref 0.0–0.1)
Basophils Relative: 0 % (ref 0–1)
Eosinophils Absolute: 0.2 10*3/uL (ref 0.0–0.7)
Eosinophils Relative: 3 % (ref 0–5)
MCH: 31.6 pg (ref 26.0–34.0)
MCV: 97.5 fL (ref 78.0–100.0)
Neutrophils Relative %: 67 % (ref 43–77)
Platelets: 136 10*3/uL — ABNORMAL LOW (ref 150–400)
RDW: 16.5 % — ABNORMAL HIGH (ref 11.5–15.5)

## 2012-10-03 LAB — PROTIME-INR
INR: 2.62 — ABNORMAL HIGH (ref 0.00–1.49)
Prothrombin Time: 26.7 seconds — ABNORMAL HIGH (ref 11.6–15.2)

## 2012-10-03 LAB — URINE MICROSCOPIC-ADD ON

## 2012-10-03 MED ORDER — ONDANSETRON HCL 4 MG/2ML IJ SOLN: 4.0000 mg | Freq: Three times a day (TID) | INTRAMUSCULAR | Status: AC | PRN

## 2012-10-03 MED ORDER — DEXTROSE 5 % IV SOLN
1.0000 g | Freq: Once | INTRAVENOUS | Status: AC
  Administered 2012-10-03: 1 g via INTRAVENOUS
  Filled 2012-10-03: qty 10

## 2012-10-03 MED ORDER — SODIUM CHLORIDE 0.9 % IV SOLN
Freq: Once | INTRAVENOUS | Status: AC
  Administered 2012-10-03: 21:00:00 via INTRAVENOUS

## 2012-10-03 MED ORDER — SODIUM CHLORIDE 0.9 % IV SOLN: INTRAVENOUS | Status: AC

## 2012-10-03 NOTE — ED Notes (Addendum)
Pt c/o fatigue and generalized weakness, SOB and palpitations  x 7 days

## 2012-10-03 NOTE — ED Notes (Signed)
Daughters report that patient's stool are very dark when she does have a bowel movment

## 2012-10-03 NOTE — ED Provider Notes (Signed)
History     CSN: [id number]  Arrival date & time 10/03/12  1932   First MD Initiated Contact with Patient 10/03/12 1950      Chief Complaint  Patient presents with  . Palpitations    (Consider location/radiation/quality/duration/timing/severity/associated sxs/prior treatment) HPI Comments:  Patient presents from home with one week history of generalized weakness, fatigue, palpitations and shortness of breath. Has a history of hypertension, diabetes, CHF, A. fib and Coumadin. Denies any chest pain, fever, chills or cough. She endorses generalized weakness and fatigue though she states she's been eating and drinking well. No abdominal pain, back pain, urinary symptoms. No cough. No increase in pain or  Increase in baseline swelling of her legs. He endorses some pain in her knees which she believes is due to arthritis. Increased DOE. Unable to lay flat at baseline.  The history is provided by the patient.    Past Medical History  Diagnosis Date  . Hypertension   . Diabetes mellitus   . CHF (congestive heart failure)   . Thyroid disease   . Atrial fibrillation     Past Surgical History  Procedure Laterality Date  . Abdominal hysterectomy    . Thyroid surgery      History reviewed. No pertinent family history.  History  Substance Use Topics  . Smoking status: Never Smoker   . Smokeless tobacco: Not on file  . Alcohol Use: No    OB History   Grav Para Term Preterm Abortions TAB SAB Ect Mult Living                  Review of Systems  Constitutional: Positive for activity change, appetite change and fatigue. Negative for fever.  HENT: Negative for nosebleeds and congestion.   Respiratory: Positive for cough and shortness of breath. Negative for chest tightness.   Cardiovascular: Positive for palpitations. Negative for chest pain.  Gastrointestinal: Negative for nausea and vomiting.  Genitourinary: Negative for dysuria.  Musculoskeletal: Negative for back pain.   Neurological: Positive for weakness. Negative for dizziness, light-headedness and numbness.  A complete 10 system review of systems was obtained and all systems are negative except as noted in the HPI and PMH.    Allergies  Review of patient's allergies indicates no known allergies.  Home Medications   Current Outpatient Rx  Name  Route  Sig  Dispense  Refill  . amLODipine (NORVASC) 10 MG tablet   Oral   Take 10 mg by mouth daily.         Marland Kitchen aspirin 81 MG tablet   Oral   Take 81 mg by mouth daily.         . carvedilol (COREG) 3.125 MG tablet   Oral   Take 3.125 mg by mouth 2 (two) times daily with a meal.         . Cholecalciferol (VITAMIN D3) 1000 UNITS CAPS   Oral   Take by mouth.         . Ferrous Sulfate (IRON) 325 (65 FE) MG TABS   Oral   Take by mouth.         Marland Kitchen glimepiride (AMARYL) 4 MG tablet   Oral   Take 4 mg by mouth 2 (two) times daily.         . insulin glargine (LANTUS) 100 UNIT/ML injection   Subcutaneous   Inject 7 Units into the skin at bedtime.         . isosorbide mononitrate (IMDUR) 30 MG 24  hr tablet   Oral   Take 30 mg by mouth daily.         Marland Kitchen levothyroxine (SYNTHROID, LEVOTHROID) 137 MCG tablet   Oral   Take 137 mcg by mouth daily.         Marland Kitchen lisinopril (PRINIVIL,ZESTRIL) 20 MG tablet   Oral   Take 20 mg by mouth daily.         . nitroGLYCERIN (NITROSTAT) 0.4 MG SL tablet   Sublingual   Place 0.4 mg under the tongue every 5 (five) minutes as needed.         . pantoprazole (PROTONIX) 40 MG tablet   Oral   Take 40 mg by mouth 2 (two) times daily.         . pravastatin (PRAVACHOL) 40 MG tablet   Oral   Take 40 mg by mouth daily.         . sitaGLIPtin (JANUVIA) 25 MG tablet   Oral   Take 25 mg by mouth daily.         Marland Kitchen warfarin (COUMADIN) 3 MG tablet   Oral   Take 3 mg by mouth every Monday.         . warfarin (COUMADIN) 6 MG tablet   Oral   Take 6 mg by mouth as directed. Every Tues, Wed,  Thurs, Sat, Sun         . WARFARIN SODIUM PO   Oral   Take 9 mg by mouth as directed. Every Friday           BP 126/39  Pulse 73  Temp(Src) 98.6 F (37 C)  Resp 17  Ht 5\' 2"  (1.575 m)  Wt 215 lb (97.523 kg)  BMI 39.31 kg/m2  SpO2 97%  Physical Exam  Constitutional: She is oriented to person, place, and time. She appears well-developed and well-nourished. No distress.  HENT:  Head: Normocephalic.  Mouth/Throat: Oropharynx is clear and moist.  Eyes: Conjunctivae and EOM are normal. Pupils are equal, round, and reactive to light.  Neck: Normal range of motion. Neck supple. No JVD present.  Cardiovascular: Normal rate, regular rhythm and normal heart sounds.   No murmur heard. Pulmonary/Chest: Effort normal and breath sounds normal. No respiratory distress. She has no wheezes.  Abdominal: Soft. There is no tenderness. There is no rebound and no guarding.  Genitourinary: Guaiac negative stool.  Musculoskeletal: Normal range of motion. She exhibits edema. She exhibits no tenderness.  Pitting edema to knees bilaterally  Neurological: She is alert and oriented to person, place, and time. No cranial nerve deficit. She exhibits normal muscle tone. Coordination normal.  Skin: Skin is warm.    ED Course  Procedures (including critical care time)  Labs Reviewed  CBC WITH DIFFERENTIAL - Abnormal; Notable for the following:    RBC 2.85 (*)    Hemoglobin 9.0 (*)    HCT 27.8 (*)    RDW 16.5 (*)    Platelets 136 (*)    All other components within normal limits  COMPREHENSIVE METABOLIC PANEL - Abnormal; Notable for the following:    Glucose, Bld 122 (*)    BUN 58 (*)    Creatinine, Ser 3.10 (*)    GFR calc non Af Amer 13 (*)    GFR calc Af Amer 15 (*)    All other components within normal limits  PRO B NATRIURETIC PEPTIDE - Abnormal; Notable for the following:    Pro B Natriuretic peptide (BNP) 5306.0 (*)  All other components within normal limits  PROTIME-INR - Abnormal;  Notable for the following:    Prothrombin Time 26.7 (*)    INR 2.62 (*)    All other components within normal limits  URINALYSIS, ROUTINE W REFLEX MICROSCOPIC - Abnormal; Notable for the following:    Leukocytes, UA MODERATE (*)    All other components within normal limits  URINE MICROSCOPIC-ADD ON - Abnormal; Notable for the following:    Bacteria, UA FEW (*)    All other components within normal limits  URINE CULTURE  TROPONIN I  OCCULT BLOOD X 1 CARD TO LAB, STOOL   Dg Chest 2 View  10/03/2012   *RADIOLOGY REPORT*  Clinical Data: Weakness and palpitations.  CHEST - 2 VIEW  Comparison: 08/30/2012  Findings: There is stable cardiomegaly.  No edema, infiltrate or pleural effusion is identified.  Mild degenerative changes are present in the thoracic spine.  IMPRESSION: No active disease.  Stable cardiomegaly.   Original Report Authenticated By: Aletta Edouard, M.D.     1. Anemia   2. Urinary tract infection   3. Renal failure       MDM  One week history nondescript generalized weakness with palpitations, nausea, shortness of breath. No chest pain. No focal neurological deficits. Worsening DOE and cough.  EKG with atrial fibrillation, no ST changes. INR therapeutic./ Doubt PE. Hemoglobin is 9 comparison. Guaiac-negative. Creatinine is 3. No comparison either. Family states she has a history of kidney problems but he did not know her numbers.  Urinalysis with questionable infection. We'll treat with Rocephin. Slight elevation in heart rate with standing. Blood pressure remained stable. No evidence of pulmonary edema or congestion on chest x-ray to BNP elevated at 5000. Patient has some edema in legs but nor worse than baseline by report.  Possible early CHF exacerbation. We'll admit for anemia which may be symptomatic as well as renal failure, and UTI. D/w Dr. Alcario Drought   Date: 10/03/2012  Rate: 71  Rhythm: atrial fibrillation  QRS Axis: normal  Intervals: normal  ST/T Wave  abnormalities: normal  Conduction Disutrbances:none  Narrative Interpretation:   Old EKG Reviewed: none available    Ezequiel Essex, MD 10/04/12 0130

## 2012-10-04 ENCOUNTER — Inpatient Hospital Stay (HOSPITAL_COMMUNITY): Payer: Medicare Other

## 2012-10-04 DIAGNOSIS — E039 Hypothyroidism, unspecified: Secondary | ICD-10-CM

## 2012-10-04 DIAGNOSIS — N179 Acute kidney failure, unspecified: Secondary | ICD-10-CM

## 2012-10-04 DIAGNOSIS — I509 Heart failure, unspecified: Secondary | ICD-10-CM

## 2012-10-04 DIAGNOSIS — R5381 Other malaise: Secondary | ICD-10-CM

## 2012-10-04 DIAGNOSIS — N189 Chronic kidney disease, unspecified: Secondary | ICD-10-CM

## 2012-10-04 LAB — BASIC METABOLIC PANEL
BUN: 55 mg/dL — ABNORMAL HIGH (ref 6–23)
CO2: 23 mEq/L (ref 19–32)
Calcium: 9.6 mg/dL (ref 8.4–10.5)
Creatinine, Ser: 2.55 mg/dL — ABNORMAL HIGH (ref 0.50–1.10)
Glucose, Bld: 135 mg/dL — ABNORMAL HIGH (ref 70–99)
Sodium: 137 mEq/L (ref 135–145)

## 2012-10-04 LAB — IRON AND TIBC
Iron: 30 ug/dL — ABNORMAL LOW (ref 42–135)
Saturation Ratios: 11 % — ABNORMAL LOW (ref 20–55)
TIBC: 271 ug/dL (ref 250–470)

## 2012-10-04 LAB — GLUCOSE, CAPILLARY: Glucose-Capillary: 99 mg/dL (ref 70–99)

## 2012-10-04 LAB — VITAMIN B12: Vitamin B-12: 940 pg/mL — ABNORMAL HIGH (ref 211–911)

## 2012-10-04 LAB — TROPONIN I
Troponin I: <0.3 ng/mL (ref <0.30)
Troponin I: <0.3 ng/mL (ref <0.30)
Troponin I: <0.3 ng/mL (ref <0.30)

## 2012-10-04 MED ORDER — ISOSORBIDE MONONITRATE ER 30 MG PO TB24
30.0000 mg | ORAL_TABLET | Freq: Every day | ORAL | Status: DC
  Administered 2012-10-04 – 2012-10-05 (×2): 30 mg via ORAL
  Filled 2012-10-04 (×2): qty 1

## 2012-10-04 MED ORDER — LEVOTHYROXINE SODIUM 137 MCG PO TABS
137.0000 ug | ORAL_TABLET | Freq: Every day | ORAL | Status: DC
  Administered 2012-10-04 – 2012-10-05 (×2): 137 ug via ORAL
  Filled 2012-10-04 (×3): qty 1

## 2012-10-04 MED ORDER — ASPIRIN 81 MG PO CHEW
81.0000 mg | CHEWABLE_TABLET | Freq: Every day | ORAL | Status: DC
  Filled 2012-10-04 (×2): qty 1

## 2012-10-04 MED ORDER — FUROSEMIDE 10 MG/ML IJ SOLN
40.0000 mg | Freq: Once | INTRAMUSCULAR | Status: AC
  Administered 2012-10-04: 40 mg via INTRAVENOUS
  Filled 2012-10-04: qty 4

## 2012-10-04 MED ORDER — INSULIN GLARGINE 100 UNIT/ML ~~LOC~~ SOLN
5.0000 [IU] | Freq: Every day | SUBCUTANEOUS | Status: DC
  Administered 2012-10-04 – 2012-10-05 (×2): 5 [IU] via SUBCUTANEOUS
  Filled 2012-10-04 (×2): qty 0.05

## 2012-10-04 MED ORDER — INSULIN ASPART 100 UNIT/ML ~~LOC~~ SOLN
0.0000 [IU] | SUBCUTANEOUS | Status: DC
  Administered 2012-10-04 (×3): 3 [IU] via SUBCUTANEOUS
  Administered 2012-10-04: 2 [IU] via SUBCUTANEOUS
  Administered 2012-10-04 – 2012-10-05 (×2): 3 [IU] via SUBCUTANEOUS
  Administered 2012-10-05 (×2): 2 [IU] via SUBCUTANEOUS

## 2012-10-04 MED ORDER — AMLODIPINE BESYLATE 10 MG PO TABS
10.0000 mg | ORAL_TABLET | Freq: Every day | ORAL | Status: DC
  Administered 2012-10-04 – 2012-10-05 (×2): 10 mg via ORAL
  Filled 2012-10-04 (×2): qty 1

## 2012-10-04 MED ORDER — WARFARIN - PHARMACIST DOSING INPATIENT: Freq: Every day | Status: DC

## 2012-10-04 MED ORDER — WARFARIN SODIUM 6 MG PO TABS
6.0000 mg | ORAL_TABLET | Freq: Once | ORAL | Status: AC
  Administered 2012-10-04: 6 mg via ORAL
  Filled 2012-10-04: qty 1

## 2012-10-04 MED ORDER — DEXTROSE 5 % IV SOLN
1.0000 g | INTRAVENOUS | Status: DC
  Filled 2012-10-04: qty 10

## 2012-10-04 MED ORDER — SIMVASTATIN 20 MG PO TABS
20.0000 mg | ORAL_TABLET | Freq: Every day | ORAL | Status: DC
  Administered 2012-10-04: 20 mg via ORAL
  Filled 2012-10-04 (×2): qty 1

## 2012-10-04 MED ORDER — CARVEDILOL 3.125 MG PO TABS
3.1250 mg | ORAL_TABLET | Freq: Two times a day (BID) | ORAL | Status: DC
  Administered 2012-10-04 – 2012-10-05 (×3): 3.125 mg via ORAL
  Filled 2012-10-04 (×5): qty 1

## 2012-10-04 MED ORDER — LISINOPRIL 20 MG PO TABS
20.0000 mg | ORAL_TABLET | Freq: Every day | ORAL | Status: DC
  Filled 2012-10-04: qty 1

## 2012-10-04 MED ORDER — NITROGLYCERIN 0.4 MG SL SUBL: 0.4000 mg | SUBLINGUAL_TABLET | SUBLINGUAL | Status: DC | PRN

## 2012-10-04 MED ORDER — PANTOPRAZOLE SODIUM 40 MG PO TBEC
40.0000 mg | DELAYED_RELEASE_TABLET | Freq: Two times a day (BID) | ORAL | Status: DC
  Administered 2012-10-04 – 2012-10-05 (×4): 40 mg via ORAL
  Filled 2012-10-04 (×2): qty 1

## 2012-10-04 NOTE — H&P (Signed)
Hospitalist Admission History and Physical  Patient name: Madeline [name] Medical record number: [id number] Date of birth: 04-01-1926 Age: 79 y.o. Gender: female  Primary Care Provider: [facility] Cardiologist: Dr. Etter Sjogren   Chief Complaint: weakness, CHF exacerbation   History of Present Illness:This is a 79 y.o. year old female with significant past medical history of HTN, Afib on coumadin, CHF, CKD (stage unknown)  presenting with weakness x 1 week. Pt lives at home with daughters. Family states that pt has been weak over the past week, feeling fatigued with walking short distance as well as increased cough. Pt denies any CP, dizziness, hemiparesis or confusion. Cough has been more of a "wet" cough per pt that she cannot clear. Appetite has been fairly stable. Family states that pt takes fluid pills but has noticed that has not been urinating like normal. Has had persistent LE swelling. No recent NSAID or increased salt intake. Pt cannot lay down flat at baseline. This has seemed to worsen this week.   On presentation to the ER pt Hgb 9 as well as Pro BNP of 5000. CXR with stable cardiomegaly. Pt also with Cr 3.1. Family unsure if this is pt baseline cr. Pt still makes urine, but it has been decreased over the last week.  UA was concerning for UTI. Pt started on rocephin.     There are no active problems to display for this patient.  Past Medical History: Past Medical History  Diagnosis Date  . Hypertension   . Diabetes mellitus   . CHF (congestive heart failure)   . Thyroid disease   . Atrial fibrillation     Past Surgical History: Past Surgical History  Procedure Laterality Date  . Abdominal hysterectomy    . Thyroid surgery      Social History: History   Social History  . Marital Status: Married    Spouse Name: N/A    Number of Children: N/A  . Years of Education: N/A   Social History Main Topics  . Smoking status: Never Smoker   . Smokeless tobacco: None   . Alcohol Use: No  . Drug Use: No  . Sexually Active: None   Other Topics Concern  . None   Social History Narrative  . None    Family History: History reviewed. No pertinent family history.  Allergies: No Known Allergies  Current Facility-Administered Medications  Medication Dose Route Frequency Provider Last Rate Last Dose  . 0.9 %  sodium chloride infusion   Intravenous STAT Ezequiel Essex, MD      . amLODipine (NORVASC) tablet 10 mg  10 mg Oral Daily Shanda Howells, MD      . aspirin chewable tablet 81 mg  81 mg Oral Daily Shanda Howells, MD      . carvedilol (COREG) tablet 3.125 mg  3.125 mg Oral BID WC Shanda Howells, MD      . isosorbide mononitrate (IMDUR) 24 hr tablet 30 mg  30 mg Oral Daily Shanda Howells, MD      . levothyroxine (SYNTHROID, LEVOTHROID) tablet 137 mcg  137 mcg Oral QAC breakfast Shanda Howells, MD      . lisinopril (PRINIVIL,ZESTRIL) tablet 20 mg  20 mg Oral Daily Shanda Howells, MD      . nitroGLYCERIN (NITROSTAT) SL tablet 0.4 mg  0.4 mg Sublingual Q5 min PRN Shanda Howells, MD      . ondansetron [facility]) injection 4 mg  4 mg Intravenous Q8H PRN Ezequiel Essex, MD      .  pantoprazole (PROTONIX) EC tablet 40 mg  40 mg Oral BID Shanda Howells, MD      . simvastatin (ZOCOR) tablet 20 mg  20 mg Oral q1800 Shanda Howells, MD       Review Of Systems: 12 point ROS negative except as noted above in HPI.  Physical Exam: Filed Vitals:   10/04/12 0012  BP: 103/52  Pulse: 62  Temp: 98 F (36.7 C)  Resp: 20    General: obese, NAD  HEENT: PERRLA and extra ocular movement intact Heart: S1, S2 normal, no murmur, rub or gallop, regular rate and rhythm, large neck girth, no visible JVD Lungs: good overall air movement. Faint rales in bases  Abdomen: obese abdomen, non tender Extremities: 2+ peripheral pulses, 2+ pitting edema bilaterally  Skin:no rashes Neurology: normal without focal findings  Labs and Imaging: Lab Results  Component Value Date/Time   NA  142 10/03/2012  8:20 PM   K 4.1 10/03/2012  8:20 PM   CL 99 10/03/2012  8:20 PM   CO2 29 10/03/2012  8:20 PM   BUN 58* 10/03/2012  8:20 PM   CREATININE 3.10* 10/03/2012  8:20 PM   GLUCOSE 122* 10/03/2012  8:20 PM   Lab Results  Component Value Date   WBC 7.3 10/03/2012   HGB 9.0* 10/03/2012   HCT 27.8* 10/03/2012   MCV 97.5 10/03/2012   PLT 136* 10/03/2012    Dg Chest 2 View  10/03/2012   *RADIOLOGY REPORT*  Clinical Data: Weakness and palpitations.  CHEST - 2 VIEW  Comparison: 08/30/2012  Findings: There is stable cardiomegaly.  No edema, infiltrate or pleural effusion is identified.  Mild degenerative changes are present in the thoracic spine.  IMPRESSION: No active disease.  Stable cardiomegaly.   Original Report Authenticated By: Aletta Edouard, M.D.     Assessment and Plan: [name] is a 79 y.o. year old female presenting with weakness  Weakness: Likely secondary to CHF exacerbation. Mildly volume overloaded on exam. Will gently diurese pt. Will check 2d ECHO to assess contractility. Strict Is and Os. Daily weight. Cycle CEs. Consider cards c/s if no clinical improvement.  Renal Failure: Unclear etiology. Unsure what is pt's baseline Cr. There may be component of poor renal perfusion in setting of CHF exacerbation. Will gently diurese and reassess. Will also consult renal. No metabolic acidosis which is somewhat reassuring.   Anemia: Hgb 9 today. Unclear if this is pt's baseline. On coumadin for afib. Higher risk of bleeding. No signs of blood loss clinically. FOBT negative. Will trend in am. Type and screen in interim.   UTI: Continue rocephin. Urine culture.   DM: SSI. A1c  Hypothyroidism: Check TSH. Continue synthroid.    FEN/GI: low sodium diet. PPI  Prophylaxis: on coumadin  Disposition: pending further evaluation.  Code Status:DNR        Shanda Howells MD  Pager: [phone]

## 2012-10-04 NOTE — Progress Notes (Signed)
  Echocardiogram 2D Echocardiogram has been performed.  Madeline [name], Datil 10/04/2012, 10:45 AM

## 2012-10-04 NOTE — Progress Notes (Signed)
ANTICOAGULATION CONSULT NOTE - Follow up [place] for warfarin Indication: atrial fibrillation  No Known Allergies  Patient Measurements: Height: 5\' 2"  (157.5 cm) Weight: 214 lb 4.8 oz (97.206 kg) IBW/kg (Calculated) : 50.1  Vital Signs: Temp: 97.9 F (36.6 C) (05/29 0927) Temp src: Oral (05/29 0927) BP: 141/57 mmHg (05/29 0927) Pulse Rate: 78 (05/29 0927)  Labs:  Recent Labs  10/03/12 2020 10/04/12 0109 10/04/12 0740  HGB 9.0*  --   --   HCT 27.8*  --   --   PLT 136*  --   --   LABPROT 26.7*  --   --   INR 2.62*  --   --   CREATININE 3.10*  --   --   TROPONINI <0.30 <0.30 <0.30    Estimated Creatinine Clearance: 14.4 ml/min (by C-G formula based on Cr of 3.1).   Medical History: Past Medical History  Diagnosis Date  . Hypertension   . Diabetes mellitus   . CHF (congestive heart failure)   . Thyroid disease   . Atrial fibrillation     Medications:  Scheduled:  . sodium chloride   Intravenous STAT  . amLODipine  10 mg Oral Daily  . aspirin  81 mg Oral Daily  . carvedilol  3.125 mg Oral BID WC  . insulin aspart  0-15 Units Subcutaneous Q4H  . insulin glargine  5 Units Subcutaneous Daily  . isosorbide mononitrate  30 mg Oral Daily  . levothyroxine  137 mcg Oral QAC breakfast  . pantoprazole  40 mg Oral BID  . simvastatin  20 mg Oral q1800    Assessment: 79 yo female with h/o atrial fibrillation who presented with weakness. Baseline labs include SrCr 3.1, Hgb 9, and platelets 136. No previous labs available to compare. INR (2.62) is at-goal. Pharmacy to manage warfarin. Home dose is Coumadin 6mg  daily except 3mg  on Monday and 9mg  on Friday.  Goal of Therapy:  INR 2-3 Monitor platelets by anticoagulation protocol: Yes   Plan:  Coumadin 6mg  today and f/u daily protime  Davonna Belling, PharmD, BCPS Pager [phone] 10/04/2012,11:12 AM

## 2012-10-04 NOTE — Progress Notes (Signed)
UR COMPLETED  

## 2012-10-04 NOTE — Progress Notes (Signed)
[facility] PROGRESS NOTE  Madeline [name] [id number] DOB: 1926-01-12 DOA: 10/03/2012 PCP: No primary provider on file.  Assessment/Plan: Acute on chronic renal failure -Partly due to patient's recent increase the furosemide -Hold furosemide -Liberalize fluid intake -Discontinue lisinopril -Renal ultrasound- -I spoke with the patient's primary care provider, Dr. Briscoe Deutscher, patient's serum creatinine 07/19/2012 was 2.7  -Patient normally follows [facility] for her CKD  Generalize weakness -Likely multifactorial including but not limited to deconditioning and acute on chronic renal failure -PT evaluation Atrial fibrillation -Appears to be permanent -Rate controlled -Continue carvedilol -Continue warfarin Chronic CHF -Await echocardiogram results -Does not appear to be decompensated -Continue Imdur lower and carvedilol Diabetes mellitus type 2 -Hemoglobin A1c 6.9 in March 2014 -Hold glimeperide and Januvia -Novology SSI -Lantus 5 and adjust as needed Hypothyroidism -TSH 1.96 -Patient's levothyroxine was increased approximately 2 months ago Normocytic anemia -Patient had hemoglobin 10.3 in March 2014 -Check iron studies, B12, RBC folate Bacteruria -Discontinue antibiotics -Patient is asymptomatic without any pyuria  Family Communication:   Daughters updated  at beside Disposition Plan:   Home when medically stable        Procedures/Studies: Dg Chest 2 View  10/03/2012   *RADIOLOGY REPORT*  Clinical Data: Weakness and palpitations.  CHEST - 2 VIEW  Comparison: 08/30/2012  Findings: There is stable cardiomegaly.  No edema, infiltrate or pleural effusion is identified.  Mild degenerative changes are present in the thoracic spine.  IMPRESSION: No active disease.  Stable cardiomegaly.   Original Report Authenticated By: Aletta Edouard, M.D.            Subjective: Patient denies any fevers, chills, chest discomfort, shortness of breath,  nausea, vomiting, diarrhea. No dysuria. She has some dyspnea on exertion, but states that this has been essentially unchanged for the past 2 years. Denies any dizziness or syncope.   Objective: Filed Vitals:   10/03/12 2221 10/04/12 0012 10/04/12 0420 10/04/12 0927  BP:  103/52 140/66 141/57  Pulse: 73 62 68 78  Temp:  98 F (36.7 C) 98.5 F (36.9 C) 97.9 F (36.6 C)  TempSrc:  Oral Oral Oral  Resp: 17 20 20 17   Height:  5\' 2"  (1.575 m)    Weight:  97.206 kg (214 lb 4.8 oz)    SpO2: 97% 94% 100% 100%    Intake/Output Summary (Last 24 hours) at 10/04/12 1325 Last data filed at 10/04/12 0800  Gross per 24 hour  Intake    480 ml  Output    100 ml  Net    380 ml   Weight change:  Exam:   General:  Pt is alert, follows commands appropriately, not in acute distress  HEENT: No icterus, No thrush, [place]/AT  Cardiovascular: IRRR, S1/S2, no rubs,   Respiratory: CTA bilaterally, no wheezing, no crackles, no rhonchi  Abdomen: Soft/+BS, non tender, non distended, no guarding  Extremities: 1+ edema, No lymphangitis, No petechiae, No rashes, no synovitis  Data Reviewed: Basic Metabolic Panel:  Recent Labs Lab 10/03/12 2020  NA 142  K 4.1  CL 99  CO2 29  GLUCOSE 122*  BUN 58*  CREATININE 3.10*  CALCIUM 10.0   Liver Function Tests:  Recent Labs Lab 10/03/12 2020  AST 24  ALT 19  ALKPHOS 99  BILITOT 0.4  PROT 7.4  ALBUMIN 3.6   No results found for this basename: LIPASE, AMYLASE,  in the last 168 hours No results found for this basename: AMMONIA,  in the last 168 hours CBC:  Recent Labs Lab 10/03/12 2020  WBC 7.3  NEUTROABS 4.9  HGB 9.0*  HCT 27.8*  MCV 97.5  PLT 136*   Cardiac Enzymes:  Recent Labs Lab 10/03/12 2020 10/04/12 0109 10/04/12 0740  TROPONINI <0.30 <0.30 <0.30   BNP: No components found with this basename: POCBNP,  CBG:  Recent Labs Lab 10/03/12 2352 10/04/12 0415 10/04/12 0742 10/04/12 1236  GLUCAP 184* 162* 99 140*     No results found for this or any previous visit (from the past 240 hour(s)).   Scheduled Meds: . sodium chloride   Intravenous STAT  . amLODipine  10 mg Oral Daily  . aspirin  81 mg Oral Daily  . carvedilol  3.125 mg Oral BID WC  . insulin aspart  0-15 Units Subcutaneous Q4H  . insulin glargine  5 Units Subcutaneous Daily  . isosorbide mononitrate  30 mg Oral Daily  . levothyroxine  137 mcg Oral QAC breakfast  . pantoprazole  40 mg Oral BID  . simvastatin  20 mg Oral q1800  . warfarin  6 mg Oral ONCE-1800  . Warfarin - Pharmacist Dosing Inpatient   Does not apply q1800   Continuous Infusions:    Jesscia Imm, DO  [facility] Pager [phone]  If 7PM-7AM, please contact night-coverage [email or web address] Password [email or web address] 10/04/2012, 1:25 PM   LOS: 1 day

## 2012-10-04 NOTE — Progress Notes (Signed)
ANTICOAGULATION CONSULT NOTE - Initial Consult  Pharmacy Consult for warfarin Indication: atrial fibrillation  No Known Allergies  Patient Measurements: Height: 5\' 2"  (157.5 cm) Weight: 214 lb 4.8 oz (97.206 kg) IBW/kg (Calculated) : 50.1  Vital Signs: Temp: 98 F (36.7 C) (05/29 0012) Temp src: Oral (05/29 0012) BP: 103/52 mmHg (05/29 0012) Pulse Rate: 62 (05/29 0012)  Labs:  Recent Labs  10/03/12 2020  HGB 9.0*  HCT 27.8*  PLT 136*  LABPROT 26.7*  INR 2.62*  CREATININE 3.10*  TROPONINI <0.30    Estimated Creatinine Clearance: 14.4 ml/min (by C-G formula based on Cr of 3.1).   Medical History: Past Medical History  Diagnosis Date  . Hypertension   . Diabetes mellitus   . CHF (congestive heart failure)   . Thyroid disease   . Atrial fibrillation     Medications:  Scheduled:  . sodium chloride   Intravenous STAT  . amLODipine  10 mg Oral Daily  . aspirin  81 mg Oral Daily  . carvedilol  3.125 mg Oral BID WC  . cefTRIAXone (ROCEPHIN)  IV  1 g Intravenous Q24H  . furosemide  40 mg Intravenous Once  . isosorbide mononitrate  30 mg Oral Daily  . levothyroxine  137 mcg Oral QAC breakfast  . lisinopril  20 mg Oral Daily  . pantoprazole  40 mg Oral BID  . simvastatin  20 mg Oral q1800    Assessment: 79 yo female with h/o atrial fibrillation who presented with weakness. Baseline labs include SrCr 3.1, Hgb 9, and platelets 136. No previous labs available to compare. INR (2.62) is at-goal. Pharmacy to manage warfarin. Patient is unable to report home Coumadin regimen, and says family member should be in AM.   Goal of Therapy:  INR 2-3 Monitor platelets by anticoagulation protocol: Yes   Plan:  1. Follow-up in AM for home Coumadin regimen.  2. Daily PT / INR 3. Coumadin education with pharmacist.  Doy Mince, Shana Chute 10/04/2012,12:59 AM

## 2012-10-05 DIAGNOSIS — E039 Hypothyroidism, unspecified: Secondary | ICD-10-CM

## 2012-10-05 DIAGNOSIS — N189 Chronic kidney disease, unspecified: Secondary | ICD-10-CM

## 2012-10-05 DIAGNOSIS — R5381 Other malaise: Secondary | ICD-10-CM

## 2012-10-05 DIAGNOSIS — I509 Heart failure, unspecified: Secondary | ICD-10-CM | POA: Diagnosis present

## 2012-10-05 DIAGNOSIS — N179 Acute kidney failure, unspecified: Principal | ICD-10-CM

## 2012-10-05 DIAGNOSIS — D649 Anemia, unspecified: Secondary | ICD-10-CM

## 2012-10-05 LAB — BASIC METABOLIC PANEL
BUN: 56 mg/dL — ABNORMAL HIGH (ref 6–23)
CO2: 26 mEq/L (ref 19–32)
Chloride: 103 mEq/L (ref 96–112)
GFR calc Af Amer: 18 mL/min — ABNORMAL LOW (ref >90)
Potassium: 4.3 mEq/L (ref 3.5–5.1)

## 2012-10-05 LAB — GLUCOSE, CAPILLARY
Glucose-Capillary: 130 mg/dL — ABNORMAL HIGH (ref 70–99)
Glucose-Capillary: 135 mg/dL — ABNORMAL HIGH (ref 70–99)
Glucose-Capillary: 154 mg/dL — ABNORMAL HIGH (ref 70–99)
Glucose-Capillary: 160 mg/dL — ABNORMAL HIGH (ref 70–99)

## 2012-10-05 LAB — URINE CULTURE

## 2012-10-05 LAB — PROTIME-INR: Prothrombin Time: 22.7 seconds — ABNORMAL HIGH (ref 11.6–15.2)

## 2012-10-05 LAB — FOLATE RBC: RBC Folate: 2999 ng/mL — ABNORMAL HIGH (ref >=366)

## 2012-10-05 MED ORDER — FERROUS SULFATE 325 (65 FE) MG PO TABS: 325.0000 mg | ORAL_TABLET | Freq: Two times a day (BID) | ORAL | Status: DC

## 2012-10-05 MED ORDER — TRAMADOL HCL 50 MG PO TABS
50.0000 mg | ORAL_TABLET | Freq: Four times a day (QID) | ORAL | Status: AC | PRN
Start: 2012-10-05 — End: ?

## 2012-10-05 MED ORDER — WARFARIN SODIUM 6 MG PO TABS
6.0000 mg | ORAL_TABLET | Freq: Once | ORAL | Status: DC
  Filled 2012-10-05: qty 1

## 2012-10-05 NOTE — Progress Notes (Signed)
Patient was discharged home with family. Patient was given discharge instructions and prescriptions. Patient was told to contact the doctor with questions and concerns. Patient was told to contact primary care doctor about getting another knee brace. Patient was stable upon discharge.

## 2012-10-05 NOTE — Progress Notes (Signed)
ANTICOAGULATION CONSULT NOTE - Follow up [place] for warfarin Indication: atrial fibrillation  No Known Allergies  Patient Measurements: Height: 5\' 2"  (157.5 cm) Weight: 216 lb 6.4 oz (98.158 kg) IBW/kg (Calculated) : 50.1  Vital Signs: Temp: 98.8 F (37.1 C) (05/30 0510) Temp src: Oral (05/30 0510) BP: 111/66 mmHg (05/30 0510) Pulse Rate: 63 (05/30 0510)  Labs:  Recent Labs  10/03/12 2020 10/04/12 0109 10/04/12 0740 10/04/12 1238 10/04/12 1249 10/05/12 0510  HGB 9.0*  --   --   --   --   --   HCT 27.8*  --   --   --   --   --   PLT 136*  --   --   --   --   --   LABPROT 26.7*  --   --   --   --  22.7*  INR 2.62*  --   --   --   --  2.10*  CREATININE 3.10*  --   --   --  2.55* 2.57*  TROPONINI <0.30 <0.30 <0.30 <0.30  --   --     Estimated Creatinine Clearance: 17.5 ml/min (by C-G formula based on Cr of 2.57).   Medical History: Past Medical History  Diagnosis Date  . Hypertension   . Diabetes mellitus   . CHF (congestive heart failure)   . Thyroid disease   . Atrial fibrillation     Medications:  Scheduled:  . amLODipine  10 mg Oral Daily  . aspirin  81 mg Oral Daily  . carvedilol  3.125 mg Oral BID WC  . insulin aspart  0-15 Units Subcutaneous Q4H  . insulin glargine  5 Units Subcutaneous Daily  . isosorbide mononitrate  30 mg Oral Daily  . levothyroxine  137 mcg Oral QAC breakfast  . pantoprazole  40 mg Oral BID  . simvastatin  20 mg Oral q1800  . Warfarin - Pharmacist Dosing Inpatient   Does not apply q1800    Assessment: 79 yo female with h/o atrial fibrillation who presented with weakness. Baseline labs include SrCr 3.1, Hgb 9, and platelets 136. INR remains therapeutic today with no reports of bleeding. Home dose is Coumadin 6mg  daily except 9mg  on Wednesday and Sunday.  Goal of Therapy:  INR 2-3 Monitor platelets by anticoagulation protocol: Yes   Plan:  Repeat Coumadin 6mg  today and f/u daily protime  Davonna Belling, PharmD, BCPS Pager [phone] 10/05/2012,8:30 AM

## 2012-10-05 NOTE — Evaluation (Signed)
Physical Therapy Evaluation Patient Details Name: Madeline [name] MRN: [id number] DOB: [birth year] Today's Date: 10/05/2012 Time: [id number] PT Time Calculation (min): 40 min  PT Assessment / Plan / Recommendation Clinical Impression  Mrs. Macklinig is an 79 y/o female Patient presents from home with one week history of generalized weakness, fatigue, palpitations and shortness of breath. Has a history of hypertension, diabetes, CHF, A. fib and Coumadin.  Pt doing well with most mobility but will benefit from HHPT consult for continued safety and endurance training.  Pt reports that her daughter will push her around the supermarket on her 4 wheeled walker.  Explained to pt and family that 4 wheeled walker was not designed for that and is unsafe. Suggested pt take her wheelchair instead.  Pt is safe for d/c to home when cleared by MD.      PT Assessment  Patient needs continued PT services    Follow Up Recommendations  Home health PT    Does the patient have the potential to tolerate intense rehabilitation      Barriers to Discharge None      Equipment Recommendations  None recommended by PT    Recommendations for Other Services     Frequency Min 2X/week    Precautions / Restrictions Precautions Precautions: Fall   Pertinent Vitals/Pain No pain. SpO2 >95% on room air throughout session.       Mobility  Bed Mobility Bed Mobility: Supine to Sit;Sit to Supine Supine to Sit: 5: Supervision;HOB flat Sit to Supine: 5: Supervision;HOB flat Details for Bed Mobility Assistance: Pt requires excessive effort to transition sit<>stand Transfers Transfers: Sit to Stand;Stand to Sit Sit to Stand: 5: Supervision;From bed;From chair/3-in-1 Stand to Sit: 5: Supervision;To bed;To chair/3-in-1;With upper extremity assist Details for Transfer Assistance: Verbal and visual cueing for safe technique including hand placement and body positioning.  Ambulation/Gait Ambulation/Gait Assistance: 5:  Supervision Ambulation Distance (Feet): 120 Feet (120 x 2 with 10 minute rest between. ) Assistive device: 4-wheeled walker Ambulation/Gait Assistance Details: Verbal and visual cues for upright trunk posture. Pt relying heavily on UEs and c/o UE fatigue.   Gait Pattern: Trunk flexed Gait velocity: a little slow but WFL.   General Gait Details: Respiratory rate increases significantly during gait trainning.  SpO2  >95 throughout session.   Stairs: No Wheelchair Mobility Wheelchair Mobility: No    Exercises     PT Diagnosis: Difficulty walking  PT Problem List: Decreased activity tolerance;Decreased mobility;Decreased knowledge of use of DME;Decreased safety awareness;Obesity PT Treatment Interventions: DME instruction;Patient/family education;Gait training;Stair training;Functional mobility training   PT Goals Acute Rehab PT Goals PT Goal Formulation: With patient Time For Goal Achievement: 10/12/12 Potential to Achieve Goals: Good Pt will go Supine/Side to Sit: Independently (Demonstrating energy saving technique. ) PT Goal: Supine/Side to Sit - Progress: Goal set today Pt will go Sit to Supine/Side: with modified independence (Demonstrating energy saving technique) PT Goal: Sit to Supine/Side - Progress: Goal set today Pt will Ambulate: >150 feet;with modified independence;with rolling walker PT Goal: Ambulate - Progress: Goal set today  Visit Information  Last PT Received On: 10/05/12 Assistance Needed: +1    Subjective Data  Subjective: Agree to PT eval Patient Stated Goal: return to home with spouse and daughters.    Prior Functioning  Home Living Lives With: Spouse;Daughter Available Help at Discharge: Family;Available 24 hours/day Type of Home: House Home Access: Stairs to enter [employer] of Steps: 3 Entrance Stairs-Rails: Left Home Layout: One level Home Adaptive Equipment: Shower chair without  back;Shower chair with back;Bedside  commode/3-in-1;Walker - four wheeled;Straight cane Prior Function Level of Independence: Independent with assistive device(s) Able to Take Stairs?: Yes Driving: No Communication Communication: No difficulties    Cognition  Cognition Arousal/Alertness: Awake/alert Behavior During Therapy: WFL for tasks assessed/performed Overall Cognitive Status: Within Functional Limits for tasks assessed    Extremity/Trunk Assessment Right Upper Extremity Assessment RUE ROM/Strength/Tone: Within functional levels Left Upper Extremity Assessment LUE ROM/Strength/Tone: Within functional levels Right Lower Extremity Assessment RLE ROM/Strength/Tone: Within functional levels Left Lower Extremity Assessment LLE ROM/Strength/Tone: Within functional levels   Balance Balance Balance Assessed: No  End of Session PT - End of Session Equipment Utilized During Treatment: Gait belt Activity Tolerance: Patient tolerated treatment well Patient left: in bed;with call bell/phone within reach;with family/visitor present Nurse Communication: Mobility status  GP     Alycia Cooperwood 10/05/2012, 10:24 AM Collier Salina L. Nyoka Cowden, [place]   Pager [phone]     Cell [phone]

## 2012-10-05 NOTE — Discharge Summary (Signed)
Physician Discharge Summary  Madeline [name] [id number] DOB: [birth year] DOA: 10/03/2012  PCP: No primary provider on file.  Admit date: 10/03/2012 Discharge date: 10/05/2012  Recommendations for Outpatient Follow-up:  1. Pt will need to follow up with PCP in 2 weeks post discharge 2. Please obtain BMP to evaluate electrolytes and kidney function 3. Please also check CBC to evaluate Hg and Hct levels   Discharge Diagnoses:  Active Problems:   Physical deconditioning   Unspecified hypothyroidism   Acute on chronic renal failure Acute on chronic renal failure  -Partly due to patient's recent increase the furosemide  -Hold furosemide during the hospitalization -Patient serum creatinine improved to 2.57 on the day of discharge which is better than her usual baseline of 2.7 -Patient was instructed to resume her previous dose of furosemide 40 mg , 2 tablets in the morning, one tablet in the afternoon -Patient had recently increased her furosemide 40 mg, 2 tablets in the morning and 2 tablets in the afternoon -Discontinue lisinopril until patient follows up with primary care physician -Renal ultrasound- negative for hydronephrosis -I spoke with the patient's primary care provider, Dr. Briscoe Deutscher, patient's serum creatinine 07/19/2012 was 2.7  -Patient normally follows [facility] for her CKD  Generalize weakness  -Likely multifactorial including but not limited to deconditioning and acute on chronic renal failure  -PT evaluation--home health physical therapy was set up after PT recommendations were provided Atrial fibrillation  -Appears to be permanent  -Rate controlled  -Continue carvedilol  -Continue warfarin  Chronic CHF  -Await echocardiogram results  -Does not appear to be decompensated  -Continue Imdur lower and carvedilol  -TSH 1.968 Diabetes mellitus type 2  -Hemoglobin A1c 6.9 in March 2014  -Hold glimeperide and Januvia during hospitalization, but these   will  be restarted at the time of discharge -She will go home on her previous dose of Lantus 7 units Hypothyroidism  -TSH 1.96  -Patient's levothyroxine was increased approximately 2 months ago  Normocytic anemia  -Patient had hemoglobin 10.3 in March 2014  -Check iron studies--iron saturation 11%, B12--940  -RBC folate--pending at the time of discharge Bacteruria  -Discontinue antibiotics  -Patient is asymptomatic without any pyuria  Family Communication: Daughters updated at beside     Discharge Condition: stable  Disposition: home  Diet:heart healthy Wt Readings from Last 3 Encounters:  10/04/12 98.158 kg (216 lb 6.4 oz)  02/04/12 94.802 kg (209 lb)    History of present illness:  79 year old female with a history of hypertension, atrial fibrillation, CHF, CKD presented with one week of weakness. Upon further questioning, the patient has been weak for many months, but it has worsened within the past week. She can only walks short distance because of dyspnea on exertion. There was no chest pain, dizziness, confusion, nausea, vomiting, diarrhea, abdominal pain, dysuria. The patient has a nonproductive cough. She states that her lower extremity edema was same as usual. At the time of admission, proBNP was 5000. Chest x-ray did not reveal pulmonary edema. After admission, the patient's furosemide was discontinued as it did not appear that the patient's clinical condition was suggestive of decompensated CHF. Fact, the patient appeared hypovolemic. The patient clinically improved without the furosemide. Her serum creatinine improved to 2.57. After talking to her primary care physician on the phone, it was determined that the patient's baseline creatinine was 2.7 on 07/19/2012. Her baseline hemoglobin was 10.3. The patient clinically improved during the hospitalization without the furosemide. She participated in physical therapy. Home health  physical therapy was set up. Renal ultrasound was negative  for hydronephrosis. Because of her renal failure, her lisinopril was also discontinued. The patient was instructed to followup with her primary care physician before she restarts her lisinopril. Labs revealed that the patient was deficient with iron saturation of 11% and ferritin 69. The patient was instructed to start taking ferrous sulfate 325 mg twice a day. Overall, it was thought that the patient's generalized weakness was partly due to her acute on chronic renal failure as well as deconditioning. Her respiratory status remained stable throughout the hospitalization with oxygen saturation 99-100% on room air. Her INR was therapeutic, and pharmacy assisted with management of her Coumadin dosing. She will go home on her usual dose of warfarin.    Discharge Exam: Filed Vitals:   10/05/12 0510  BP: 111/66  Pulse: 63  Temp: 98.8 F (37.1 C)  Resp: 18   Filed Vitals:   10/04/12 1720 10/04/12 2036 10/05/12 0410 10/05/12 0510  BP: 111/39 106/62 104/65 111/66  Pulse: 55 61 73 63  Temp: 98.3 F (36.8 C) 98.5 F (36.9 C) 98.3 F (36.8 C) 98.8 F (37.1 C)  TempSrc: Oral Oral Oral Oral  Resp: 17 18 18 18   Height:      Weight:  98.158 kg (216 lb 6.4 oz)    SpO2: 99% 93% 95% 98%   General: A&O x 3, NAD, pleasant, cooperative Cardiovascular: RRR, no rub, no gallop, no S3 Respiratory: CTAB, no wheeze, no rhonchi Abdomen:soft, nontender, nondistended, positive bowel sounds Extremities: 1+ edema, No lymphangitis, no petechiae  Discharge Instructions      Discharge Orders   Future Orders Complete By Expires     Diet - low sodium heart healthy  As directed     Discharge instructions  As directed     Comments:      Restart furosemide 40mg  tablet, 2 tablets in the morning, one tablet in the afternoon Stop taking lisinopril until you follow up with your primary care physician    Increase activity slowly  As directed         Medication List    STOP taking these medications        lisinopril 20 MG tablet  Commonly known as:  PRINIVIL,ZESTRIL      TAKE these medications       amLODipine 10 MG tablet  Commonly known as:  NORVASC  Take 10 mg by mouth daily.     carvedilol 3.125 MG tablet  Commonly known as:  COREG  Take 3.125 mg by mouth 2 (two) times daily with a meal.     cholecalciferol 1000 UNITS tablet  Commonly known as:  VITAMIN D  Take 1,000 Units by mouth daily.     ferrous sulfate 325 (65 FE) MG tablet  Take 1 tablet (325 mg total) by mouth 2 (two) times daily with breakfast and lunch.     ferrous sulfate 325 (65 FE) MG tablet  Take 325 mg by mouth daily with breakfast.     glimepiride 4 MG tablet  Commonly known as:  AMARYL  Take 4 mg by mouth 2 (two) times daily.     insulin glargine 100 UNIT/ML injection  Commonly known as:  LANTUS  Inject 7 Units into the skin at bedtime.     isosorbide mononitrate 30 MG 24 hr tablet  Commonly known as:  IMDUR  Take 30 mg by mouth daily.     levothyroxine 137 MCG tablet  Commonly known as:  SYNTHROID, LEVOTHROID  Take 137-274 mcg by mouth daily before breakfast. 2 tablets on Sunday, 1 tablet the rest of the week     nitroGLYCERIN 0.4 MG SL tablet  Commonly known as:  NITROSTAT  Place 0.4 mg under the tongue every 5 (five) minutes as needed for chest pain.     pantoprazole 40 MG tablet  Commonly known as:  PROTONIX  Take 40 mg by mouth 2 (two) times daily.     pravastatin 40 MG tablet  Commonly known as:  PRAVACHOL  Take 40 mg by mouth daily.     sitaGLIPtin 25 MG tablet  Commonly known as:  JANUVIA  Take 25 mg by mouth daily.     traMADol 50 MG tablet  Commonly known as:  ULTRAM  Take 1 tablet (50 mg total) by mouth every 6 (six) hours as needed for pain.     warfarin 3 MG tablet  Commonly known as:  COUMADIN  Take 6-9 mg by mouth daily. 6mg  on Monday, Tuesday, Thursday, Friday, Saturday; 9mg  Sunday, Wednesday         The results of significant diagnostics from this hospitalization  (including imaging, microbiology, ancillary and laboratory) are listed below for reference.    Significant Diagnostic Studies: Dg Chest 2 View  10/03/2012   *RADIOLOGY REPORT*  Clinical Data: Weakness and palpitations.  CHEST - 2 VIEW  Comparison: 08/30/2012  Findings: There is stable cardiomegaly.  No edema, infiltrate or pleural effusion is identified.  Mild degenerative changes are present in the thoracic spine.  IMPRESSION: No active disease.  Stable cardiomegaly.   Original Report Authenticated By: Aletta Edouard, M.D.   US Renal  10/04/2012   *RADIOLOGY REPORT*  Clinical Data: Renal failure.  RENAL/URINARY TRACT ULTRASOUND COMPLETE  Comparison:  CT 01/12/2012  Findings:  Right Kidney:  11.2 cm. Normal size and echotexture.  No focal abnormality.  No hydronephrosis.  Left Kidney:  10.2 cm.  Mild pelvicaliectasis, similar to prior CT. Mild cortical thinning within the left kidney.  The 2.4 cm mid pole cyst.  Bladder:  Normal appearance.  Normal bilateral ureteral jets visualized.  IMPRESSION: Mild pelvicaliectasis on the left, similar to prior CT.  Mild cortical thinning on the left.   Original Report Authenticated By: Rolm Baptise, M.D.     Microbiology: Recent Results (from the past 240 hour(s))  URINE CULTURE     Status: None   Collection Time    10/03/12  8:10 PM      Result Value Range Status   Specimen Description URINE, CLEAN CATCH   Final   Special Requests NONE   Final   Culture  Setup Time 10/04/2012 01:08   Final   Colony Count 20,OOO COLONIES/ML   Final   Culture ESCHERICHIA COLI   Final   Report Status PENDING   Incomplete     Labs: Basic Metabolic Panel:  Recent Labs Lab 10/03/12 2020 10/04/12 1249 10/05/12 0510  NA 142 137 141  K 4.1 4.4 4.3  CL 99 98 103  CO2 29 23 26   GLUCOSE 122* 135* 154*  BUN 58* 55* 56*  CREATININE 3.10* 2.55* 2.57*  CALCIUM 10.0 9.6 9.3   Liver Function Tests:  Recent Labs Lab 10/03/12 2020  AST 24  ALT 19  ALKPHOS 99   BILITOT 0.4  PROT 7.4  ALBUMIN 3.6   No results found for this basename: LIPASE, AMYLASE,  in the last 168 hours No results found for this basename: AMMONIA,  in the last  168 hours CBC:  Recent Labs Lab 10/03/12 2020  WBC 7.3  NEUTROABS 4.9  HGB 9.0*  HCT 27.8*  MCV 97.5  PLT 136*   Cardiac Enzymes:  Recent Labs Lab 10/03/12 2020 10/04/12 0109 10/04/12 0740 10/04/12 1238  TROPONINI <0.30 <0.30 <0.30 <0.30   BNP: No components found with this basename: POCBNP,  CBG:  Recent Labs Lab 10/04/12 2035 10/04/12 2359 10/05/12 0408 10/05/12 0729 10/05/12 1158  GLUCAP 160* 135* 154* 130* 127*    Time coordinating discharge:  Greater than 30 minutes  Signed:  Pawel Soules, DO [facility] Pager: [phone] 10/05/2012, 12:02 PM

## 2012-10-05 NOTE — Care Management Note (Signed)
   CARE MANAGEMENT NOTE 10/05/2012  Patient:  Madeline [name], Madeline [name]   Account Number:  [id number]  Date Initiated:  10/05/2012  Documentation initiated by:  Nicki Gracy  Subjective/Objective Assessment:   PT recommending HHPT.     Action/Plan:   Met with pt and family, they selected [facility] from list of area [facility] agencies. Pt has rolling walker, family requesting knees brace or support for lt knee. Dr Tat notified.   Anticipated DC Date:  10/05/2012   Anticipated DC Plan:  [place]         [facility] Choice  HOME HEALTH   Choice offered to / List presented to:  C-4 Adult Children        HH arranged  HH-2 PT      [place].   Status of service:  Completed, signed off Medicare Important Message given?   (If response is "NO", the following Medicare IM given date fields will be blank) Date Medicare IM given:   Date Additional Medicare IM given:    Discharge Disposition:  [place]  Per UR Regulation:    If discussed at Long Length of Stay Meetings, dates discussed:    Comments:  10/05/2012 Met with pt, husband and two daughters. Given list of [place] providers and they selected [facility]. Pt has rolling walker and has no other DME needs. [facility] notified and services will begin 48-72 hr post d/c. Jasmine Pang RN MPH Case Manager [phone]

## 2012-10-09 DIAGNOSIS — N189 Chronic kidney disease, unspecified: Secondary | ICD-10-CM | POA: Diagnosis not present

## 2012-10-09 DIAGNOSIS — N184 Chronic kidney disease, stage 4 (severe): Secondary | ICD-10-CM | POA: Diagnosis not present

## 2012-10-09 DIAGNOSIS — I509 Heart failure, unspecified: Secondary | ICD-10-CM | POA: Diagnosis not present

## 2012-10-09 DIAGNOSIS — I4891 Unspecified atrial fibrillation: Secondary | ICD-10-CM | POA: Diagnosis not present

## 2012-10-09 DIAGNOSIS — I129 Hypertensive chronic kidney disease with stage 1 through stage 4 chronic kidney disease, or unspecified chronic kidney disease: Secondary | ICD-10-CM | POA: Diagnosis not present

## 2012-10-09 DIAGNOSIS — Z794 Long term (current) use of insulin: Secondary | ICD-10-CM | POA: Diagnosis not present

## 2012-10-09 DIAGNOSIS — D631 Anemia in chronic kidney disease: Secondary | ICD-10-CM | POA: Diagnosis not present

## 2012-10-09 DIAGNOSIS — E119 Type 2 diabetes mellitus without complications: Secondary | ICD-10-CM | POA: Diagnosis not present

## 2012-10-09 DIAGNOSIS — N039 Chronic nephritic syndrome with unspecified morphologic changes: Secondary | ICD-10-CM | POA: Diagnosis not present

## 2012-10-10 DIAGNOSIS — I2510 Atherosclerotic heart disease of native coronary artery without angina pectoris: Secondary | ICD-10-CM | POA: Diagnosis not present

## 2012-10-10 DIAGNOSIS — N184 Chronic kidney disease, stage 4 (severe): Secondary | ICD-10-CM | POA: Diagnosis not present

## 2012-10-10 DIAGNOSIS — I4891 Unspecified atrial fibrillation: Secondary | ICD-10-CM | POA: Diagnosis not present

## 2012-10-10 DIAGNOSIS — Z7901 Long term (current) use of anticoagulants: Secondary | ICD-10-CM | POA: Diagnosis not present

## 2012-10-10 DIAGNOSIS — I5032 Chronic diastolic (congestive) heart failure: Secondary | ICD-10-CM | POA: Diagnosis not present

## 2012-10-11 DIAGNOSIS — Z794 Long term (current) use of insulin: Secondary | ICD-10-CM | POA: Diagnosis not present

## 2012-10-11 DIAGNOSIS — N189 Chronic kidney disease, unspecified: Secondary | ICD-10-CM | POA: Diagnosis not present

## 2012-10-11 DIAGNOSIS — E119 Type 2 diabetes mellitus without complications: Secondary | ICD-10-CM | POA: Diagnosis not present

## 2012-10-11 DIAGNOSIS — I129 Hypertensive chronic kidney disease with stage 1 through stage 4 chronic kidney disease, or unspecified chronic kidney disease: Secondary | ICD-10-CM | POA: Diagnosis not present

## 2012-10-11 DIAGNOSIS — I509 Heart failure, unspecified: Secondary | ICD-10-CM | POA: Diagnosis not present

## 2012-10-11 DIAGNOSIS — I4891 Unspecified atrial fibrillation: Secondary | ICD-10-CM | POA: Diagnosis not present

## 2012-10-12 DIAGNOSIS — I129 Hypertensive chronic kidney disease with stage 1 through stage 4 chronic kidney disease, or unspecified chronic kidney disease: Secondary | ICD-10-CM | POA: Diagnosis not present

## 2012-10-12 DIAGNOSIS — E119 Type 2 diabetes mellitus without complications: Secondary | ICD-10-CM | POA: Diagnosis not present

## 2012-10-12 DIAGNOSIS — Z794 Long term (current) use of insulin: Secondary | ICD-10-CM | POA: Diagnosis not present

## 2012-10-12 DIAGNOSIS — I4891 Unspecified atrial fibrillation: Secondary | ICD-10-CM | POA: Diagnosis not present

## 2012-10-12 DIAGNOSIS — I509 Heart failure, unspecified: Secondary | ICD-10-CM | POA: Diagnosis not present

## 2012-10-12 DIAGNOSIS — N189 Chronic kidney disease, unspecified: Secondary | ICD-10-CM | POA: Diagnosis not present

## 2012-10-16 DIAGNOSIS — N189 Chronic kidney disease, unspecified: Secondary | ICD-10-CM | POA: Diagnosis not present

## 2012-10-16 DIAGNOSIS — Z794 Long term (current) use of insulin: Secondary | ICD-10-CM | POA: Diagnosis not present

## 2012-10-16 DIAGNOSIS — I129 Hypertensive chronic kidney disease with stage 1 through stage 4 chronic kidney disease, or unspecified chronic kidney disease: Secondary | ICD-10-CM | POA: Diagnosis not present

## 2012-10-16 DIAGNOSIS — I4891 Unspecified atrial fibrillation: Secondary | ICD-10-CM | POA: Diagnosis not present

## 2012-10-16 DIAGNOSIS — I509 Heart failure, unspecified: Secondary | ICD-10-CM | POA: Diagnosis not present

## 2012-10-16 DIAGNOSIS — E119 Type 2 diabetes mellitus without complications: Secondary | ICD-10-CM | POA: Diagnosis not present

## 2012-10-18 ENCOUNTER — Encounter (HOSPITAL_COMMUNITY)
Admission: RE | Admit: 2012-10-18 | Discharge: 2012-10-18 | Disposition: A | Discharge status: Home / Self Care | Payer: Medicare Other | Source: Ambulatory Visit | Attending: Nephrology | Admitting: Nephrology

## 2012-10-18 DIAGNOSIS — N184 Chronic kidney disease, stage 4 (severe): Secondary | ICD-10-CM | POA: Insufficient documentation

## 2012-10-18 DIAGNOSIS — D649 Anemia, unspecified: Secondary | ICD-10-CM | POA: Insufficient documentation

## 2012-10-18 DIAGNOSIS — E039 Hypothyroidism, unspecified: Secondary | ICD-10-CM | POA: Insufficient documentation

## 2012-10-18 DIAGNOSIS — I4891 Unspecified atrial fibrillation: Secondary | ICD-10-CM | POA: Diagnosis not present

## 2012-10-18 DIAGNOSIS — I509 Heart failure, unspecified: Secondary | ICD-10-CM | POA: Insufficient documentation

## 2012-10-18 LAB — POCT HEMOGLOBIN-HEMACUE: Hemoglobin: 9.2 g/dL — ABNORMAL LOW (ref 12.0–15.0)

## 2012-10-18 MED ORDER — EPOETIN ALFA 10000 UNIT/ML IJ SOLN
INTRAMUSCULAR | Status: AC
  Administered 2012-10-18: 10000 [IU] via SUBCUTANEOUS
  Filled 2012-10-18: qty 1

## 2012-10-18 MED ORDER — EPOETIN ALFA 10000 UNIT/ML IJ SOLN: 10000.0000 [IU] | INTRAMUSCULAR | Status: DC

## 2012-10-19 DIAGNOSIS — I4891 Unspecified atrial fibrillation: Secondary | ICD-10-CM | POA: Diagnosis not present

## 2012-10-19 DIAGNOSIS — I129 Hypertensive chronic kidney disease with stage 1 through stage 4 chronic kidney disease, or unspecified chronic kidney disease: Secondary | ICD-10-CM | POA: Diagnosis not present

## 2012-10-19 DIAGNOSIS — I509 Heart failure, unspecified: Secondary | ICD-10-CM | POA: Diagnosis not present

## 2012-10-19 DIAGNOSIS — Z794 Long term (current) use of insulin: Secondary | ICD-10-CM | POA: Diagnosis not present

## 2012-10-19 DIAGNOSIS — N189 Chronic kidney disease, unspecified: Secondary | ICD-10-CM | POA: Diagnosis not present

## 2012-10-19 DIAGNOSIS — E119 Type 2 diabetes mellitus without complications: Secondary | ICD-10-CM | POA: Diagnosis not present

## 2012-10-19 LAB — IRON AND TIBC
Saturation Ratios: 13 % — ABNORMAL LOW (ref 20–55)
TIBC: 304 ug/dL (ref 250–470)
UIBC: 264 ug/dL (ref 125–400)

## 2012-10-19 LAB — FERRITIN: Ferritin: 73 ng/mL (ref 10–291)

## 2012-10-20 DIAGNOSIS — I129 Hypertensive chronic kidney disease with stage 1 through stage 4 chronic kidney disease, or unspecified chronic kidney disease: Secondary | ICD-10-CM | POA: Diagnosis not present

## 2012-10-20 DIAGNOSIS — I509 Heart failure, unspecified: Secondary | ICD-10-CM | POA: Diagnosis not present

## 2012-10-20 DIAGNOSIS — E119 Type 2 diabetes mellitus without complications: Secondary | ICD-10-CM | POA: Diagnosis not present

## 2012-10-20 DIAGNOSIS — I4891 Unspecified atrial fibrillation: Secondary | ICD-10-CM | POA: Diagnosis not present

## 2012-10-20 DIAGNOSIS — N189 Chronic kidney disease, unspecified: Secondary | ICD-10-CM | POA: Diagnosis not present

## 2012-10-20 DIAGNOSIS — Z794 Long term (current) use of insulin: Secondary | ICD-10-CM | POA: Diagnosis not present

## 2012-10-22 ENCOUNTER — Emergency Department (HOSPITAL_COMMUNITY)
Admission: EM | Admit: 2012-10-22 | Discharge: 2012-10-22 | Disposition: A | Discharge status: Home / Self Care | Payer: Medicare Other | Attending: Cardiology | Admitting: Cardiology

## 2012-10-22 ENCOUNTER — Emergency Department (HOSPITAL_COMMUNITY): Payer: Medicare Other

## 2012-10-22 ENCOUNTER — Encounter (HOSPITAL_COMMUNITY): Payer: Self-pay | Admitting: Emergency Medicine

## 2012-10-22 DIAGNOSIS — I4891 Unspecified atrial fibrillation: Secondary | ICD-10-CM | POA: Diagnosis not present

## 2012-10-22 DIAGNOSIS — R0602 Shortness of breath: Secondary | ICD-10-CM | POA: Diagnosis not present

## 2012-10-22 DIAGNOSIS — Z794 Long term (current) use of insulin: Secondary | ICD-10-CM | POA: Insufficient documentation

## 2012-10-22 DIAGNOSIS — I509 Heart failure, unspecified: Secondary | ICD-10-CM | POA: Diagnosis not present

## 2012-10-22 DIAGNOSIS — I129 Hypertensive chronic kidney disease with stage 1 through stage 4 chronic kidney disease, or unspecified chronic kidney disease: Secondary | ICD-10-CM | POA: Insufficient documentation

## 2012-10-22 DIAGNOSIS — Z79899 Other long term (current) drug therapy: Secondary | ICD-10-CM | POA: Insufficient documentation

## 2012-10-22 DIAGNOSIS — N189 Chronic kidney disease, unspecified: Secondary | ICD-10-CM | POA: Diagnosis not present

## 2012-10-22 DIAGNOSIS — R5381 Other malaise: Secondary | ICD-10-CM | POA: Insufficient documentation

## 2012-10-22 DIAGNOSIS — E119 Type 2 diabetes mellitus without complications: Secondary | ICD-10-CM | POA: Diagnosis not present

## 2012-10-22 DIAGNOSIS — M7989 Other specified soft tissue disorders: Secondary | ICD-10-CM | POA: Diagnosis not present

## 2012-10-22 DIAGNOSIS — Z7901 Long term (current) use of anticoagulants: Secondary | ICD-10-CM | POA: Insufficient documentation

## 2012-10-22 DIAGNOSIS — N179 Acute kidney failure, unspecified: Secondary | ICD-10-CM | POA: Diagnosis not present

## 2012-10-22 LAB — URINALYSIS, ROUTINE W REFLEX MICROSCOPIC
Glucose, UA: NEGATIVE mg/dL
Hgb urine dipstick: NEGATIVE
Ketones, ur: NEGATIVE mg/dL
Protein, ur: NEGATIVE mg/dL
Urobilinogen, UA: 0.2 mg/dL (ref 0.0–1.0)

## 2012-10-22 LAB — CBC WITH DIFFERENTIAL/PLATELET
Eosinophils Absolute: 0.2 10*3/uL (ref 0.0–0.7)
Eosinophils Relative: 3 % (ref 0–5)
HCT: 28.9 % — ABNORMAL LOW (ref 36.0–46.0)
Hemoglobin: 9.3 g/dL — ABNORMAL LOW (ref 12.0–15.0)
Lymphocytes Relative: 19 % (ref 12–46)
Lymphs Abs: 1.3 10*3/uL (ref 0.7–4.0)
MCH: 30.1 pg (ref 26.0–34.0)
MCV: 93.5 fL (ref 78.0–100.0)
Monocytes Absolute: 0.5 10*3/uL (ref 0.1–1.0)
Monocytes Relative: 7 % (ref 3–12)
RBC: 3.09 MIL/uL — ABNORMAL LOW (ref 3.87–5.11)

## 2012-10-22 LAB — URINE MICROSCOPIC-ADD ON

## 2012-10-22 LAB — COMPREHENSIVE METABOLIC PANEL
BUN: 27 mg/dL — ABNORMAL HIGH (ref 6–23)
CO2: 31 mEq/L (ref 19–32)
Calcium: 9.5 mg/dL (ref 8.4–10.5)
GFR calc Af Amer: 23 mL/min — ABNORMAL LOW (ref >90)
GFR calc non Af Amer: 20 mL/min — ABNORMAL LOW (ref >90)
Glucose, Bld: 175 mg/dL — ABNORMAL HIGH (ref 70–99)
Total Protein: 7.2 g/dL (ref 6.0–8.3)

## 2012-10-22 LAB — PRO B NATRIURETIC PEPTIDE: Pro B Natriuretic peptide (BNP): 4388 pg/mL — ABNORMAL HIGH (ref 0–450)

## 2012-10-22 LAB — POCT I-STAT TROPONIN I: Troponin i, poc: 0.01 ng/mL (ref 0.00–0.08)

## 2012-10-22 MED ORDER — POTASSIUM CHLORIDE CRYS ER 20 MEQ PO TBCR
40.0000 meq | EXTENDED_RELEASE_TABLET | Freq: Two times a day (BID) | ORAL | Status: AC
  Administered 2012-10-22: 40 meq via ORAL
  Filled 2012-10-22: qty 2

## 2012-10-22 MED ORDER — FUROSEMIDE 10 MG/ML IJ SOLN
60.0000 mg | Freq: Once | INTRAMUSCULAR | Status: AC
  Administered 2012-10-22: 60 mg via INTRAVENOUS
  Filled 2012-10-22: qty 6

## 2012-10-22 NOTE — ED Notes (Signed)
Admit date: 10/22/2012 Referring Physician  ED Primary Physician  Georga Bora, MD Primary Cardiologist  Candee Furbish, MD Reason for Consultation  Four pound weight gain  ASSESSMENT: 1. Chronic diastolic heart failure with 4 pound weight gain  2. Chronic kidney disease, stage III-IV  3. Diabetes mellitus  4. Obesity  5. Anemia, unchanged from May. The patient is getting outpatient iron.  PLAN:  1. The patient received 80 mg of IV Lasix in the emergency room with a good diuresis.  2. Will increase her diuretic regimen to 120 mg each a.m. and 80 mg each p.m.  3. She will need to followup with Dr. Marlou Porch or Dr. Maceo Pro within the next 72 hours for Bmet to rule out progressive renal insufficiency   HPI: Pleasant 79 year old with diastolic heart failure who called [facility] cardiology office today complaining of a 4 pound weight gain. She was told to come to the emergency room. She is not acutely ill. She has chronic dyspnea. Lower extremity swelling is actually better now than it was earlier in the week. She denies chest pain. Dr. Marlou Porch is on vacation and instructions were given to seek the ER attention by one of the nurses in the office.   PMH:   Past Medical History  Diagnosis Date  . Hypertension   . Diabetes mellitus   . CHF (congestive heart failure)   . Thyroid disease   . Atrial fibrillation      PSH:   Past Surgical History  Procedure Laterality Date  . Abdominal hysterectomy    . Thyroid surgery      Allergies:  Review of patient's allergies indicates no known allergies. Prior to Admit Meds:   (Not in a hospital admission) Fam HX:   No family history on file. Social HX:    History   Social History  . Marital Status: Married    Spouse Name: N/A    Number of Children: N/A  . Years of Education: N/A   Occupational History  . Not on file.   Social History Main Topics  . Smoking status: Never Smoker   . Smokeless tobacco: Not on file  . Alcohol Use: No  . Drug  Use: No  . Sexually Active: Not on file   Other Topics Concern  . Not on file   Social History Narrative  . No narrative on file     Review of Systems: No acute complaints  Physical Exam: Blood pressure 136/56, pulse 77, temperature 98.9 F (37.2 C), resp. rate 20, height 5\' 3"  (1.6 m), weight 100.699 kg (222 lb), SpO2 95.00%. Weight change:    Pale. No acute distress.  CV wave noted with the patient sitting at 90.  Decreased breath sounds at the bases but generally clear. No rales are heard. No wheezing.  Cardiac exam reveals a regular rhythm. No murmur or rub is heard.  Abdomen is distended but soft. Bowel sounds are normal.  Extremities reveal 2+ edema ankles to mid shin.  Neurological exam is unremarkable. Labs:   Lab Results  Component Value Date   WBC 6.6 10/22/2012   HGB 9.3* 10/22/2012   HCT 28.9* 10/22/2012   MCV 93.5 10/22/2012   PLT 165 10/22/2012    Recent Labs Lab 10/22/12 1317  NA 140  K 3.5  CL 97  CO2 31  BUN 27*  CREATININE 2.12*  CALCIUM 9.5  PROT 7.2  BILITOT 0.4  ALKPHOS 105  ALT 12  AST 21  GLUCOSE 175*  No results found for this basename: PTT   Lab Results  Component Value Date   INR 2.16* 10/22/2012   INR 2.10* 10/05/2012   INR 2.62* 10/03/2012   Lab Results  Component Value Date   TROPONINI <0.30 10/04/2012     BNP (last 3 results)  Recent Labs  10/03/12 2020 10/22/12 1317  PROBNP 5306.0* 4388.0*    Radiology:  Dg Chest 2 View  10/22/2012   *RADIOLOGY REPORT*  Clinical Data: Shortness of breath.  CHEST - 2 VIEW  Comparison: 10/03/2012 and 08/30/2012 radiographs.  Abdominal CT 01/12/2012.  Findings: Cardiomegaly and aortic atherosclerosis are stable. There is chronic central airway thickening which appears mildly progressive.  There is new mild blunting of the right costophrenic angle which could reflect a small right pleural effusion.  There is no focal airspace disease or pneumothorax.  Mild degenerative changes in the  spine are noted.  IMPRESSION: Increased central airway thickening suspicious for bronchitis. Possible small right pleural effusion.  Stable cardiomegaly.   Original Report Authenticated By: Richardean Sale, M.D.   EKG:  Atrial fibrillation with nonspecific T-wave flattening    Sinclair Grooms 10/22/2012 6:27 PM

## 2012-10-22 NOTE — ED Notes (Addendum)
Sob and swelling  Started this week saw her dr last Thursday and gaining weight  States has been coughing has to lay on a lot of pillow

## 2012-10-22 NOTE — ED Provider Notes (Signed)
History     CSN: [id number]  Arrival date & time 10/22/12  1250   First MD Initiated Contact with Patient 10/22/12 1507      Chief Complaint  Patient presents with  . Shortness of Breath    (Consider location/radiation/quality/duration/timing/severity/associated sxs/prior treatment) HPI Pt with history of CHF managed by Dr Tamala Julian present with persistent gradual increase in SOB especially with any exertion and lying flat. +increased lower ext bl. No CP, fever, chills, abd pain, N/V/D or urinary symptoms. Pt daughters states she has been increasing her lasix but continues to gain weight.  Past Medical History  Diagnosis Date  . Hypertension   . Diabetes mellitus   . CHF (congestive heart failure)   . Thyroid disease   . Atrial fibrillation     Past Surgical History  Procedure Laterality Date  . Abdominal hysterectomy    . Thyroid surgery      No family history on file.  History  Substance Use Topics  . Smoking status: Never Smoker   . Smokeless tobacco: Not on file  . Alcohol Use: No    OB History   Grav Para Term Preterm Abortions TAB SAB Ect Mult Living                  Review of Systems  Constitutional: Positive for fatigue. Negative for fever and chills.  Respiratory: Positive for shortness of breath. Negative for wheezing.   Cardiovascular: Positive for leg swelling. Negative for chest pain and palpitations.  Gastrointestinal: Negative for nausea, vomiting, abdominal pain and diarrhea.  Genitourinary: Negative for dysuria and frequency.  Musculoskeletal: Negative for myalgias and back pain.  Skin: Negative for rash and wound.  Neurological: Negative for dizziness, weakness, light-headedness, numbness and headaches.  All other systems reviewed and are negative.    Allergies  Review of patient's allergies indicates no known allergies.  Home Medications   Current Outpatient Rx  Name  Route  Sig  Dispense  Refill  . amLODipine (NORVASC) 10 MG tablet    Oral   Take 10 mg by mouth daily.         . carvedilol (COREG) 6.25 MG tablet   Oral   Take 6.25 mg by mouth 2 (two) times daily with a meal.         . cholecalciferol (VITAMIN D) 1000 UNITS tablet   Oral   Take 1,000 Units by mouth daily.         . ferrous sulfate 325 (65 FE) MG tablet   Oral   Take 325 mg by mouth daily with breakfast.         . furosemide (LASIX) 40 MG tablet   Oral   Take 80 mg by mouth 2 (two) times daily.         Marland Kitchen glimepiride (AMARYL) 4 MG tablet   Oral   Take 4 mg by mouth 2 (two) times daily.         . insulin glargine (LANTUS) 100 UNIT/ML injection   Subcutaneous   Inject 25 Units into the skin at bedtime.          . isosorbide mononitrate (IMDUR) 30 MG 24 hr tablet   Oral   Take 30 mg by mouth daily.         Marland Kitchen levothyroxine (SYNTHROID, LEVOTHROID) 137 MCG tablet   Oral   Take 137-274 mcg by mouth daily before breakfast. Takes 2 tablets on Sunday, takes 1 tablet on all remaining days.         Marland Kitchen  nystatin-triamcinolone (MYCOLOG II) cream   Topical   Apply 1 application topically 2 (two) times daily.         . pantoprazole (PROTONIX) 40 MG tablet   Oral   Take 40 mg by mouth 2 (two) times daily.         . pravastatin (PRAVACHOL) 40 MG tablet   Oral   Take 40 mg by mouth daily.         . silver sulfADIAZINE (SILVADENE) 1 % cream   Topical   Apply 1 application topically daily.         . sitaGLIPtin (JANUVIA) 50 MG tablet   Oral   Take 25 mg by mouth daily.         . traMADol (ULTRAM) 50 MG tablet   Oral   Take 1 tablet (50 mg total) by mouth every 6 (six) hours as needed for pain.   30 tablet   0   . warfarin (COUMADIN) 3 MG tablet   Oral   Take 6-9 mg by mouth daily. Takes 2 tablets daily, except takes 3 tablets on Wednesday and Saturday.         . nitroGLYCERIN (NITROSTAT) 0.4 MG SL tablet   Sublingual   Place 0.4 mg under the tongue every 5 (five) minutes as needed for chest pain.            BP 134/54  Pulse 77  Temp(Src) 98.9 F (37.2 C)  Resp 23  Ht 5\' 3"  (1.6 m)  Wt 222 lb (100.699 kg)  BMI 39.34 kg/m2  SpO2 94%  Physical Exam  Nursing note and vitals reviewed. Constitutional: She is oriented to person, place, and time. She appears well-developed and well-nourished. No distress.  HENT:  Head: Normocephalic and atraumatic.  Mouth/Throat: Oropharynx is clear and moist.  Eyes: EOM are normal. Pupils are equal, round, and reactive to light.  Neck: Normal range of motion. Neck supple.  Cardiovascular: Normal rate and regular rhythm.   Pulmonary/Chest: Effort normal. No respiratory distress. She has no wheezes. She has rales (bl crackles).  Abdominal: Soft. Bowel sounds are normal. She exhibits no distension. There is no tenderness. There is no rebound and no guarding.  Musculoskeletal: Normal range of motion. She exhibits edema (2+ pitting edema bilaterally up to knees. ). She exhibits no tenderness.  Neurological: She is alert and oriented to person, place, and time.  Moves all ext without deficit  Skin: Skin is warm and dry. No rash noted. No erythema.  Psychiatric: She has a normal mood and affect. Her behavior is normal.    ED Course  Procedures (including critical care time)  Labs Reviewed  PRO B NATRIURETIC PEPTIDE - Abnormal; Notable for the following:    Pro B Natriuretic peptide (BNP) 4388.0 (*)    All other components within normal limits  PROTIME-INR - Abnormal; Notable for the following:    Prothrombin Time 23.2 (*)    INR 2.16 (*)    All other components within normal limits  CBC WITH DIFFERENTIAL - Abnormal; Notable for the following:    RBC 3.09 (*)    Hemoglobin 9.3 (*)    HCT 28.9 (*)    RDW 17.0 (*)    All other components within normal limits  COMPREHENSIVE METABOLIC PANEL - Abnormal; Notable for the following:    Glucose, Bld 175 (*)    BUN 27 (*)    Creatinine, Ser 2.12 (*)    Albumin 3.4 (*)    GFR calc  non Af Amer 20 (*)     GFR calc Af Amer 23 (*)    All other components within normal limits  URINALYSIS, ROUTINE W REFLEX MICROSCOPIC - Abnormal; Notable for the following:    Leukocytes, UA TRACE (*)    All other components within normal limits  URINE MICROSCOPIC-ADD ON  POCT I-STAT TROPONIN I   Dg Chest 2 View  10/22/2012   *RADIOLOGY REPORT*  Clinical Data: Shortness of breath.  CHEST - 2 VIEW  Comparison: 10/03/2012 and 08/30/2012 radiographs.  Abdominal CT 01/12/2012.  Findings: Cardiomegaly and aortic atherosclerosis are stable. There is chronic central airway thickening which appears mildly progressive.  There is new mild blunting of the right costophrenic angle which could reflect a small right pleural effusion.  There is no focal airspace disease or pneumothorax.  Mild degenerative changes in the spine are noted.  IMPRESSION: Increased central airway thickening suspicious for bronchitis. Possible small right pleural effusion.  Stable cardiomegaly.   Original Report Authenticated By: Richardean Sale, M.D.     1. Congestive heart failure, unspecified   2. Acute on chronic renal failure      Date: 10/22/2012  Rate: 61  Rhythm: atrial fibrillation  QRS Axis: normal  Intervals: normal  ST/T Wave abnormalities: normal  Conduction Disutrbances:none  Narrative Interpretation:   Old EKG Reviewed: none available    MDM   Seen by Dr Tamala Julian. Recommended d/c home with out pt followup. Recommends increasing lasix. Return precautions given.       Julianne Rice, MD 10/22/12 1919

## 2012-10-23 DIAGNOSIS — I509 Heart failure, unspecified: Secondary | ICD-10-CM | POA: Diagnosis not present

## 2012-10-23 DIAGNOSIS — E119 Type 2 diabetes mellitus without complications: Secondary | ICD-10-CM | POA: Diagnosis not present

## 2012-10-23 DIAGNOSIS — N189 Chronic kidney disease, unspecified: Secondary | ICD-10-CM | POA: Diagnosis not present

## 2012-10-23 DIAGNOSIS — Z794 Long term (current) use of insulin: Secondary | ICD-10-CM | POA: Diagnosis not present

## 2012-10-23 DIAGNOSIS — I129 Hypertensive chronic kidney disease with stage 1 through stage 4 chronic kidney disease, or unspecified chronic kidney disease: Secondary | ICD-10-CM | POA: Diagnosis not present

## 2012-10-23 DIAGNOSIS — I4891 Unspecified atrial fibrillation: Secondary | ICD-10-CM | POA: Diagnosis not present

## 2012-10-24 DIAGNOSIS — I129 Hypertensive chronic kidney disease with stage 1 through stage 4 chronic kidney disease, or unspecified chronic kidney disease: Secondary | ICD-10-CM | POA: Diagnosis not present

## 2012-10-24 DIAGNOSIS — I4891 Unspecified atrial fibrillation: Secondary | ICD-10-CM | POA: Diagnosis not present

## 2012-10-24 DIAGNOSIS — I509 Heart failure, unspecified: Secondary | ICD-10-CM | POA: Diagnosis not present

## 2012-10-24 DIAGNOSIS — E119 Type 2 diabetes mellitus without complications: Secondary | ICD-10-CM | POA: Diagnosis not present

## 2012-10-24 DIAGNOSIS — N189 Chronic kidney disease, unspecified: Secondary | ICD-10-CM | POA: Diagnosis not present

## 2012-10-24 DIAGNOSIS — Z794 Long term (current) use of insulin: Secondary | ICD-10-CM | POA: Diagnosis not present

## 2012-10-25 ENCOUNTER — Encounter (HOSPITAL_COMMUNITY)
Admission: RE | Admit: 2012-10-25 | Discharge: 2012-10-25 | Disposition: A | Discharge status: Home / Self Care | Payer: Medicare Other | Source: Ambulatory Visit | Attending: Nephrology | Admitting: Nephrology

## 2012-10-25 DIAGNOSIS — I4891 Unspecified atrial fibrillation: Secondary | ICD-10-CM | POA: Diagnosis not present

## 2012-10-25 DIAGNOSIS — I5032 Chronic diastolic (congestive) heart failure: Secondary | ICD-10-CM | POA: Diagnosis not present

## 2012-10-25 DIAGNOSIS — N184 Chronic kidney disease, stage 4 (severe): Secondary | ICD-10-CM | POA: Diagnosis not present

## 2012-10-25 DIAGNOSIS — D649 Anemia, unspecified: Secondary | ICD-10-CM | POA: Diagnosis not present

## 2012-10-25 DIAGNOSIS — E039 Hypothyroidism, unspecified: Secondary | ICD-10-CM | POA: Diagnosis not present

## 2012-10-25 DIAGNOSIS — I509 Heart failure, unspecified: Secondary | ICD-10-CM | POA: Diagnosis not present

## 2012-10-25 LAB — CBC
HCT: 27.2 % — ABNORMAL LOW (ref 36.0–46.0)
MCH: 30.7 pg (ref 26.0–34.0)
MCV: 91.9 fL (ref 78.0–100.0)
Platelets: 152 10*3/uL (ref 150–400)
RBC: 2.96 MIL/uL — ABNORMAL LOW (ref 3.87–5.11)
WBC: 6.7 10*3/uL (ref 4.0–10.5)

## 2012-10-25 MED ORDER — EPOETIN ALFA 10000 UNIT/ML IJ SOLN
INTRAMUSCULAR | Status: DC
Start: 2012-10-25 — End: 2012-10-26
  Filled 2012-10-25: qty 1

## 2012-10-25 MED ORDER — EPOETIN ALFA 10000 UNIT/ML IJ SOLN
10000.0000 [IU] | INTRAMUSCULAR | Status: DC
  Administered 2012-10-25: 10000 [IU] via SUBCUTANEOUS

## 2012-10-26 DIAGNOSIS — N189 Chronic kidney disease, unspecified: Secondary | ICD-10-CM | POA: Diagnosis not present

## 2012-10-26 DIAGNOSIS — Z794 Long term (current) use of insulin: Secondary | ICD-10-CM | POA: Diagnosis not present

## 2012-10-26 DIAGNOSIS — E119 Type 2 diabetes mellitus without complications: Secondary | ICD-10-CM | POA: Diagnosis not present

## 2012-10-26 DIAGNOSIS — I509 Heart failure, unspecified: Secondary | ICD-10-CM | POA: Diagnosis not present

## 2012-10-26 DIAGNOSIS — Z7901 Long term (current) use of anticoagulants: Secondary | ICD-10-CM | POA: Diagnosis not present

## 2012-10-26 DIAGNOSIS — I4891 Unspecified atrial fibrillation: Secondary | ICD-10-CM | POA: Diagnosis not present

## 2012-10-26 DIAGNOSIS — I129 Hypertensive chronic kidney disease with stage 1 through stage 4 chronic kidney disease, or unspecified chronic kidney disease: Secondary | ICD-10-CM | POA: Diagnosis not present

## 2012-10-31 ENCOUNTER — Other Ambulatory Visit (HOSPITAL_COMMUNITY): Payer: Self-pay | Admitting: *Deleted

## 2012-10-31 DIAGNOSIS — I129 Hypertensive chronic kidney disease with stage 1 through stage 4 chronic kidney disease, or unspecified chronic kidney disease: Secondary | ICD-10-CM | POA: Diagnosis not present

## 2012-10-31 DIAGNOSIS — E119 Type 2 diabetes mellitus without complications: Secondary | ICD-10-CM | POA: Diagnosis not present

## 2012-10-31 DIAGNOSIS — I509 Heart failure, unspecified: Secondary | ICD-10-CM | POA: Diagnosis not present

## 2012-10-31 DIAGNOSIS — N189 Chronic kidney disease, unspecified: Secondary | ICD-10-CM | POA: Diagnosis not present

## 2012-10-31 DIAGNOSIS — Z794 Long term (current) use of insulin: Secondary | ICD-10-CM | POA: Diagnosis not present

## 2012-10-31 DIAGNOSIS — I4891 Unspecified atrial fibrillation: Secondary | ICD-10-CM | POA: Diagnosis not present

## 2012-11-01 ENCOUNTER — Encounter (HOSPITAL_COMMUNITY)
Admission: RE | Admit: 2012-11-01 | Discharge: 2012-11-01 | Disposition: A | Discharge status: Home / Self Care | Payer: Medicare Other | Source: Ambulatory Visit | Attending: Nephrology | Admitting: Nephrology

## 2012-11-01 DIAGNOSIS — E119 Type 2 diabetes mellitus without complications: Secondary | ICD-10-CM | POA: Diagnosis not present

## 2012-11-01 DIAGNOSIS — I509 Heart failure, unspecified: Secondary | ICD-10-CM | POA: Diagnosis not present

## 2012-11-01 DIAGNOSIS — I4891 Unspecified atrial fibrillation: Secondary | ICD-10-CM | POA: Diagnosis not present

## 2012-11-01 DIAGNOSIS — Z794 Long term (current) use of insulin: Secondary | ICD-10-CM | POA: Diagnosis not present

## 2012-11-01 DIAGNOSIS — I129 Hypertensive chronic kidney disease with stage 1 through stage 4 chronic kidney disease, or unspecified chronic kidney disease: Secondary | ICD-10-CM | POA: Diagnosis not present

## 2012-11-01 DIAGNOSIS — N189 Chronic kidney disease, unspecified: Secondary | ICD-10-CM | POA: Diagnosis not present

## 2012-11-01 MED ORDER — FERUMOXYTOL INJECTION 510 MG/17 ML
INTRAVENOUS | Status: AC
  Administered 2012-11-01: 510 mg via INTRAVENOUS
  Filled 2012-11-01: qty 17

## 2012-11-01 MED ORDER — EPOETIN ALFA 10000 UNIT/ML IJ SOLN
INTRAMUSCULAR | Status: AC
  Filled 2012-11-01: qty 1

## 2012-11-01 MED ORDER — SODIUM CHLORIDE 0.9 % IV SOLN
INTRAVENOUS | Status: DC
Start: 2012-11-01 — End: 2012-11-02
  Administered 2012-11-01: 09:00:00 via INTRAVENOUS

## 2012-11-01 MED ORDER — EPOETIN ALFA 10000 UNIT/ML IJ SOLN
10000.0000 [IU] | INTRAMUSCULAR | Status: DC
  Administered 2012-11-01: 10000 [IU] via SUBCUTANEOUS

## 2012-11-01 MED ORDER — FERUMOXYTOL INJECTION 510 MG/17 ML: 510.0000 mg | INTRAVENOUS | Status: DC

## 2012-11-03 ENCOUNTER — Inpatient Hospital Stay (HOSPITAL_BASED_OUTPATIENT_CLINIC_OR_DEPARTMENT_OTHER)
Admission: EM | Admit: 2012-11-03 | Discharge: 2012-11-13 | DRG: 292 | Disposition: A | Discharge status: Home Health | Payer: Medicare Other | Attending: Internal Medicine | Admitting: Internal Medicine

## 2012-11-03 ENCOUNTER — Emergency Department (HOSPITAL_BASED_OUTPATIENT_CLINIC_OR_DEPARTMENT_OTHER): Payer: Medicare Other

## 2012-11-03 ENCOUNTER — Encounter (HOSPITAL_BASED_OUTPATIENT_CLINIC_OR_DEPARTMENT_OTHER): Payer: Self-pay | Admitting: *Deleted

## 2012-11-03 DIAGNOSIS — N184 Chronic kidney disease, stage 4 (severe): Secondary | ICD-10-CM | POA: Diagnosis present

## 2012-11-03 DIAGNOSIS — E119 Type 2 diabetes mellitus without complications: Secondary | ICD-10-CM

## 2012-11-03 DIAGNOSIS — J811 Chronic pulmonary edema: Secondary | ICD-10-CM

## 2012-11-03 DIAGNOSIS — Z794 Long term (current) use of insulin: Secondary | ICD-10-CM | POA: Diagnosis not present

## 2012-11-03 DIAGNOSIS — K5900 Constipation, unspecified: Secondary | ICD-10-CM | POA: Diagnosis present

## 2012-11-03 DIAGNOSIS — I129 Hypertensive chronic kidney disease with stage 1 through stage 4 chronic kidney disease, or unspecified chronic kidney disease: Secondary | ICD-10-CM | POA: Diagnosis present

## 2012-11-03 DIAGNOSIS — E1129 Type 2 diabetes mellitus with other diabetic kidney complication: Secondary | ICD-10-CM | POA: Diagnosis present

## 2012-11-03 DIAGNOSIS — I509 Heart failure, unspecified: Secondary | ICD-10-CM | POA: Diagnosis not present

## 2012-11-03 DIAGNOSIS — I5043 Acute on chronic combined systolic (congestive) and diastolic (congestive) heart failure: Secondary | ICD-10-CM | POA: Diagnosis present

## 2012-11-03 DIAGNOSIS — I5031 Acute diastolic (congestive) heart failure: Secondary | ICD-10-CM | POA: Diagnosis present

## 2012-11-03 DIAGNOSIS — E873 Alkalosis: Secondary | ICD-10-CM | POA: Diagnosis not present

## 2012-11-03 DIAGNOSIS — E669 Obesity, unspecified: Secondary | ICD-10-CM | POA: Diagnosis present

## 2012-11-03 DIAGNOSIS — J984 Other disorders of lung: Secondary | ICD-10-CM | POA: Diagnosis not present

## 2012-11-03 DIAGNOSIS — M109 Gout, unspecified: Secondary | ICD-10-CM | POA: Diagnosis not present

## 2012-11-03 DIAGNOSIS — I4891 Unspecified atrial fibrillation: Secondary | ICD-10-CM | POA: Diagnosis present

## 2012-11-03 DIAGNOSIS — R0602 Shortness of breath: Secondary | ICD-10-CM | POA: Diagnosis not present

## 2012-11-03 DIAGNOSIS — D649 Anemia, unspecified: Secondary | ICD-10-CM | POA: Diagnosis present

## 2012-11-03 DIAGNOSIS — E039 Hypothyroidism, unspecified: Secondary | ICD-10-CM | POA: Diagnosis present

## 2012-11-03 DIAGNOSIS — R05 Cough: Secondary | ICD-10-CM | POA: Diagnosis not present

## 2012-11-03 DIAGNOSIS — I5033 Acute on chronic diastolic (congestive) heart failure: Secondary | ICD-10-CM | POA: Diagnosis present

## 2012-11-03 DIAGNOSIS — R5381 Other malaise: Secondary | ICD-10-CM | POA: Diagnosis not present

## 2012-11-03 DIAGNOSIS — Z79899 Other long term (current) drug therapy: Secondary | ICD-10-CM

## 2012-11-03 DIAGNOSIS — N390 Urinary tract infection, site not specified: Secondary | ICD-10-CM | POA: Diagnosis not present

## 2012-11-03 DIAGNOSIS — Z6837 Body mass index (BMI) 37.0-37.9, adult: Secondary | ICD-10-CM

## 2012-11-03 DIAGNOSIS — E876 Hypokalemia: Secondary | ICD-10-CM | POA: Diagnosis not present

## 2012-11-03 HISTORY — DX: Anemia, unspecified: D64.9

## 2012-11-03 LAB — CBC
HCT: 28.4 % — ABNORMAL LOW (ref 36.0–46.0)
MCHC: 32.4 g/dL (ref 30.0–36.0)
MCV: 95.6 fL (ref 78.0–100.0)
RDW: 17.4 % — ABNORMAL HIGH (ref 11.5–15.5)

## 2012-11-03 LAB — BASIC METABOLIC PANEL
BUN: 26 mg/dL — ABNORMAL HIGH (ref 6–23)
Chloride: 98 mEq/L (ref 96–112)
Creatinine, Ser: 2 mg/dL — ABNORMAL HIGH (ref 0.50–1.10)
GFR calc Af Amer: 25 mL/min — ABNORMAL LOW (ref >90)
GFR calc non Af Amer: 22 mL/min — ABNORMAL LOW (ref >90)
Glucose, Bld: 230 mg/dL — ABNORMAL HIGH (ref 70–99)

## 2012-11-03 MED ORDER — FUROSEMIDE 10 MG/ML IJ SOLN
40.0000 mg | Freq: Once | INTRAMUSCULAR | Status: AC
  Administered 2012-11-04: 40 mg via INTRAVENOUS
  Filled 2012-11-03: qty 4

## 2012-11-03 NOTE — ED Notes (Signed)
Pt's family reports pt w/ hx of CHF - states pt has had increased lower extremity edema and now to include lower abd edema - pt noted to have 3+ pitting edema to bilat lower extremities. Pt's family called on call cardiologist whom recommended pt take additional dose of of her diuretic, pt did w/o relief. Pt is now short of breath, fine crackles noted to rt lung fields and diminished throughout. Pt is A&Ox4, admits to weakness and "head pressure." Cardiac monitor placed on pt, w/ cont pulse ox and 2L/min of O2 applied via .

## 2012-11-03 NOTE — ED Provider Notes (Signed)
History    This chart was scribed for Madeline Mckenny Alfonso Patten, MD by Roxan Diesel, ED scribe.  This patient was seen in room MH10/MH10 and the patient's care was started at 11:12 PM.   CSN: [id number]  Arrival date & time 11/03/12  2221    Chief Complaint  Patient presents with  . Leg Swelling  . Shortness of Breath    Patient is a 79 y.o. female presenting with shortness of breath. The history is provided by the patient and a relative. No language interpreter was used.  Shortness of Breath Severity:  Moderate Onset quality:  Gradual Duration:  6 days Timing:  Constant Progression:  Worsening Chronicity:  Recurrent Relieved by:  Nothing Worsened by:  Nothing tried Ineffective treatments:  Diuretics (changed for lasix to demedex) Associated symptoms: no chest pain and no fever   Associated symptoms comment:  Leg swelling, weight gain Risk factors comment:  History of CHF   HPI Comments: Madeline [name] is a 79 y.o. female with h/o CHF, DM, thyroid disease, kidney failure and HTN who presents to the Emergency Department complaining of leg swelling and SOB.   Pt's family report that for the past 6 days she has developed sudden weight gain (8 pounds), progressively-worsening SOB, and progressively-worsening swelling to the legs.  Pt had been medicating regularly with Lasix, which was not providing relief for her symptoms.  2 days ago she received a Lasix injection in the office, which also was not effective.  Yesterday pt's fluid retention medication was changed from Lasix to torsemide.  Pt denies CP.  In the past pt has been [facility] to [facility] for [facility]F.    Past Medical History  Diagnosis Date  . Hypertension   . Diabetes mellitus   . CHF (congestive heart failure)   . Thyroid disease   . Atrial fibrillation   . Kidney failure     Past Surgical History  Procedure Laterality Date  . Abdominal hysterectomy    . Thyroid surgery      No family history on  file.   History  Substance Use Topics  . Smoking status: Never Smoker   . Smokeless tobacco: Never Used  . Alcohol Use: No    OB History   Grav Para Term Preterm Abortions TAB SAB Ect Mult Living                   Review of Systems  Constitutional: Negative for fever.  Respiratory: Positive for shortness of breath.   Cardiovascular: Positive for leg swelling. Negative for chest pain.  All other systems reviewed and are negative.      Allergies  Review of patient's allergies indicates no known allergies.  Home Medications   Current Outpatient Rx  Name  Route  Sig  Dispense  Refill  . amLODipine (NORVASC) 10 MG tablet   Oral   Take 10 mg by mouth daily.         . carvedilol (COREG) 6.25 MG tablet   Oral   Take 6.25 mg by mouth 2 (two) times daily with a meal.         . cholecalciferol (VITAMIN D) 1000 UNITS tablet   Oral   Take 1,000 Units by mouth daily.         . ferrous sulfate 325 (65 FE) MG tablet   Oral   Take 325 mg by mouth daily with breakfast.         . furosemide (LASIX) 40 MG tablet  Oral   Take 120 mg by mouth every morning.          . furosemide (LASIX) 80 MG tablet   Oral   Take 80 mg by mouth every evening.         Marland Kitchen glimepiride (AMARYL) 4 MG tablet   Oral   Take 4 mg by mouth 2 (two) times daily.         . insulin glargine (LANTUS) 100 UNIT/ML injection   Subcutaneous   Inject 25 Units into the skin at bedtime.          . isosorbide mononitrate (IMDUR) 30 MG 24 hr tablet   Oral   Take 30 mg by mouth daily.         Marland Kitchen levothyroxine (SYNTHROID, LEVOTHROID) 137 MCG tablet   Oral   Take 137-274 mcg by mouth daily before breakfast. Takes 2 tablets on Sunday, takes 1 tablet on all remaining days.         . nitroGLYCERIN (NITROSTAT) 0.4 MG SL tablet   Sublingual   Place 0.4 mg under the tongue every 5 (five) minutes as needed for chest pain.         Marland Kitchen nystatin-triamcinolone (MYCOLOG II) cream   Topical    Apply 1 application topically 2 (two) times daily.         . pantoprazole (PROTONIX) 40 MG tablet   Oral   Take 40 mg by mouth 2 (two) times daily.         . pravastatin (PRAVACHOL) 40 MG tablet   Oral   Take 40 mg by mouth daily.         . silver sulfADIAZINE (SILVADENE) 1 % cream   Topical   Apply 1 application topically daily.         . sitaGLIPtin (JANUVIA) 50 MG tablet   Oral   Take 25 mg by mouth daily.         . traMADol (ULTRAM) 50 MG tablet   Oral   Take 1 tablet (50 mg total) by mouth every 6 (six) hours as needed for pain.   30 tablet   0   . warfarin (COUMADIN) 3 MG tablet   Oral   Take 6-9 mg by mouth daily. Takes 2 tablets daily, except takes 3 tablets on Wednesday and Saturday.          BP 137/73  Pulse 74  Temp(Src) 98.2 F (36.8 C) (Oral)  Resp 23  SpO2 95%  Physical Exam  Nursing note and vitals reviewed. Constitutional: She appears well-developed and well-nourished. No distress.  HENT:  Head: Normocephalic and atraumatic.  Mouth/Throat: Oropharynx is clear and moist.  Eyes: EOM are normal. Pupils are equal, round, and reactive to light.  Neck: Normal range of motion. Neck supple. No tracheal deviation present.  Cardiovascular: Normal rate.   Irregular rhythm  Pulmonary/Chest: Effort normal. No respiratory distress. She has decreased breath sounds.  Abdominal: Soft. Bowel sounds are normal. There is no tenderness. There is no rebound and no guarding.  Musculoskeletal: Normal range of motion. She exhibits edema (Pitting edema bilateral legs up to thighs).  Neurological: She is alert.  Skin: Skin is warm and dry.  Candidal infection to lower abdomen skin  Psychiatric: She has a normal mood and affect. Her behavior is normal.    ED Course  Procedures (including critical care time)  DIAGNOSTIC STUDIES: Oxygen Saturation is 95% on room air, adequate by my interpretation.    COORDINATION OF CARE:  11:18 PM-Discussed treatment plan  which includes CXR, EKG and labs with pt and family at bedside and they agreed to plan.     Labs Reviewed  GLUCOSE, CAPILLARY - Abnormal; Notable for the following:    Glucose-Capillary 250 (*)    All other components within normal limits  CBC - Abnormal; Notable for the following:    RBC 2.97 (*)    Hemoglobin 9.2 (*)    HCT 28.4 (*)    RDW 17.4 (*)    All other components within normal limits  BASIC METABOLIC PANEL - Abnormal; Notable for the following:    Glucose, Bld 230 (*)    BUN 26 (*)    Creatinine, Ser 2.00 (*)    GFR calc non Af Amer 22 (*)    GFR calc Af Amer 25 (*)    All other components within normal limits  PRO B NATRIURETIC PEPTIDE - Abnormal; Notable for the following:    Pro B Natriuretic peptide (BNP) 2029.0 (*)    All other components within normal limits  PROTIME-INR - Abnormal; Notable for the following:    Prothrombin Time 24.2 (*)    INR 2.26 (*)    All other components within normal limits  TROPONIN I     Dg Chest 2 View  11/03/2012   *RADIOLOGY REPORT*  Clinical Data: Leg swelling, shortness of breath, history of CHF and atrial fibrillation  CHEST - 2 VIEW  Comparison: 10/22/2012  Findings: Grossly unchanged enlarged cardiac silhouette and mediastinal contours with atherosclerotic calcification within the thoracic aorta.  The pulmonary vasculature is indistinct with cephalization of flow.  Evaluation of retrosternal clear space obscured secondary to overlying soft tissues. Minimal bibasilar opacities, right greater than left, favored to represent atelectasis.  Suspected trace right-sided pleural effusion.  No pneumothorax.  Unchanged bones.  IMPRESSION: Mild pulmonary edema with suspected trace right-sided pleural effusion.   Original Report Authenticated By: Jake Seats, MD    No diagnosis found.  MDM   Date: 11/04/2012  Rate: 72  Rhythm: atrial fibrillation  QRS Axis: normal  Intervals: normal  ST/T Wave abnormalities: nonspecific ST changes   Conduction Disutrbances:none  Narrative Interpretation:   Old EKG Reviewed: unchanged   I personally performed the services described in this documentation, which was scribed in my presence. The recorded information has been reviewed and is accurate.     Carlisle Beers, MD 11/04/12 [phone]

## 2012-11-03 NOTE — ED Notes (Signed)
Pt has hx of CHF. Has had increased swelling and SOB. Recent change to her fluid pill

## 2012-11-04 ENCOUNTER — Encounter (HOSPITAL_COMMUNITY): Payer: Self-pay | Admitting: Nurse Practitioner

## 2012-11-04 DIAGNOSIS — E1129 Type 2 diabetes mellitus with other diabetic kidney complication: Secondary | ICD-10-CM | POA: Diagnosis present

## 2012-11-04 DIAGNOSIS — E119 Type 2 diabetes mellitus without complications: Secondary | ICD-10-CM

## 2012-11-04 DIAGNOSIS — I5031 Acute diastolic (congestive) heart failure: Secondary | ICD-10-CM

## 2012-11-04 DIAGNOSIS — N184 Chronic kidney disease, stage 4 (severe): Secondary | ICD-10-CM | POA: Diagnosis present

## 2012-11-04 DIAGNOSIS — I5043 Acute on chronic combined systolic (congestive) and diastolic (congestive) heart failure: Secondary | ICD-10-CM | POA: Diagnosis present

## 2012-11-04 DIAGNOSIS — I509 Heart failure, unspecified: Secondary | ICD-10-CM

## 2012-11-04 DIAGNOSIS — J811 Chronic pulmonary edema: Secondary | ICD-10-CM

## 2012-11-04 LAB — GLUCOSE, CAPILLARY: Glucose-Capillary: 223 mg/dL — ABNORMAL HIGH (ref 70–99)

## 2012-11-04 LAB — PROTIME-INR: INR: 2.26 — ABNORMAL HIGH (ref 0.00–1.49)

## 2012-11-04 MED ORDER — INSULIN ASPART 100 UNIT/ML ~~LOC~~ SOLN
0.0000 [IU] | Freq: Three times a day (TID) | SUBCUTANEOUS | Status: DC
  Administered 2012-11-04: 2 [IU] via SUBCUTANEOUS
  Administered 2012-11-04: 3 [IU] via SUBCUTANEOUS
  Administered 2012-11-04 – 2012-11-05 (×2): 2 [IU] via SUBCUTANEOUS
  Administered 2012-11-05: 3 [IU] via SUBCUTANEOUS
  Administered 2012-11-05 – 2012-11-06 (×2): 2 [IU] via SUBCUTANEOUS
  Administered 2012-11-06: 3 [IU] via SUBCUTANEOUS

## 2012-11-04 MED ORDER — SIMVASTATIN 20 MG PO TABS
20.0000 mg | ORAL_TABLET | Freq: Every day | ORAL | Status: DC
  Administered 2012-11-04 – 2012-11-12 (×9): 20 mg via ORAL
  Filled 2012-11-04 (×10): qty 1

## 2012-11-04 MED ORDER — WARFARIN - PHARMACIST DOSING INPATIENT
Freq: Every day | Status: DC
  Administered 2012-11-05 – 2012-11-11 (×2)

## 2012-11-04 MED ORDER — CARVEDILOL 6.25 MG PO TABS
6.2500 mg | ORAL_TABLET | Freq: Two times a day (BID) | ORAL | Status: DC
  Administered 2012-11-04 – 2012-11-13 (×19): 6.25 mg via ORAL
  Filled 2012-11-04 (×21): qty 1

## 2012-11-04 MED ORDER — SODIUM CHLORIDE 0.9 % IJ SOLN
3.0000 mL | Freq: Two times a day (BID) | INTRAMUSCULAR | Status: DC
  Administered 2012-11-04 – 2012-11-13 (×20): 3 mL via INTRAVENOUS

## 2012-11-04 MED ORDER — LEVOTHYROXINE SODIUM 137 MCG PO TABS: 137.0000 ug | ORAL_TABLET | Freq: Every day | ORAL | Status: DC

## 2012-11-04 MED ORDER — NYSTATIN-TRIAMCINOLONE 100000-0.1 UNIT/GM-% EX CREA
1.0000 "application " | TOPICAL_CREAM | Freq: Two times a day (BID) | CUTANEOUS | Status: DC
  Administered 2012-11-04 – 2012-11-13 (×17): 1 via TOPICAL
  Filled 2012-11-04: qty 15

## 2012-11-04 MED ORDER — AMLODIPINE BESYLATE 10 MG PO TABS
10.0000 mg | ORAL_TABLET | Freq: Every day | ORAL | Status: DC
  Administered 2012-11-04 – 2012-11-13 (×10): 10 mg via ORAL
  Filled 2012-11-04 (×10): qty 1

## 2012-11-04 MED ORDER — SILVER SULFADIAZINE 1 % EX CREA
1.0000 "application " | TOPICAL_CREAM | Freq: Every day | CUTANEOUS | Status: DC
  Administered 2012-11-04 – 2012-11-13 (×9): 1 via TOPICAL
  Filled 2012-11-04: qty 85

## 2012-11-04 MED ORDER — TRAMADOL HCL 50 MG PO TABS
50.0000 mg | ORAL_TABLET | Freq: Four times a day (QID) | ORAL | Status: DC | PRN
  Administered 2012-11-04 – 2012-11-12 (×9): 50 mg via ORAL
  Filled 2012-11-04 (×8): qty 1

## 2012-11-04 MED ORDER — LEVOTHYROXINE SODIUM 137 MCG PO TABS
137.0000 ug | ORAL_TABLET | ORAL | Status: DC
  Administered 2012-11-05 – 2012-11-13 (×8): 137 ug via ORAL
  Filled 2012-11-04 (×10): qty 1

## 2012-11-04 MED ORDER — FERROUS SULFATE 325 (65 FE) MG PO TABS
325.0000 mg | ORAL_TABLET | Freq: Every day | ORAL | Status: DC
  Administered 2012-11-04: 325 mg via ORAL
  Filled 2012-11-04 (×2): qty 1

## 2012-11-04 MED ORDER — NITROGLYCERIN 0.4 MG SL SUBL: 0.4000 mg | SUBLINGUAL_TABLET | SUBLINGUAL | Status: DC | PRN

## 2012-11-04 MED ORDER — INSULIN GLARGINE 100 UNIT/ML ~~LOC~~ SOLN
12.0000 [IU] | Freq: Every day | SUBCUTANEOUS | Status: DC
  Administered 2012-11-04: 12 [IU] via SUBCUTANEOUS
  Filled 2012-11-04 (×2): qty 0.12

## 2012-11-04 MED ORDER — LEVOTHYROXINE SODIUM 137 MCG PO TABS
274.0000 ug | ORAL_TABLET | ORAL | Status: DC
  Administered 2012-11-04 – 2012-11-11 (×2): 274 ug via ORAL
  Filled 2012-11-04 (×2): qty 2

## 2012-11-04 MED ORDER — ACETAMINOPHEN 325 MG PO TABS
650.0000 mg | ORAL_TABLET | ORAL | Status: DC | PRN
  Administered 2012-11-05 – 2012-11-11 (×3): 650 mg via ORAL
  Filled 2012-11-04 (×3): qty 2

## 2012-11-04 MED ORDER — FUROSEMIDE 10 MG/ML IJ SOLN
60.0000 mg | Freq: Two times a day (BID) | INTRAMUSCULAR | Status: DC
  Administered 2012-11-04: 60 mg via INTRAVENOUS
  Filled 2012-11-04 (×2): qty 6

## 2012-11-04 MED ORDER — FUROSEMIDE 10 MG/ML IJ SOLN
60.0000 mg | Freq: Three times a day (TID) | INTRAMUSCULAR | Status: DC
  Administered 2012-11-04 – 2012-11-05 (×4): 60 mg via INTRAVENOUS
  Filled 2012-11-04 (×4): qty 6

## 2012-11-04 MED ORDER — WARFARIN SODIUM 6 MG PO TABS
9.0000 mg | ORAL_TABLET | ORAL | Status: DC
  Administered 2012-11-07 – 2012-11-10 (×2): 9 mg via ORAL
  Filled 2012-11-04 (×2): qty 1

## 2012-11-04 MED ORDER — SODIUM CHLORIDE 0.9 % IJ SOLN: 3.0000 mL | INTRAMUSCULAR | Status: DC | PRN

## 2012-11-04 MED ORDER — FERROUS SULFATE 325 (65 FE) MG PO TABS
325.0000 mg | ORAL_TABLET | Freq: Two times a day (BID) | ORAL | Status: DC
  Administered 2012-11-04 – 2012-11-13 (×18): 325 mg via ORAL
  Filled 2012-11-04 (×20): qty 1

## 2012-11-04 MED ORDER — ONDANSETRON HCL 4 MG/2ML IJ SOLN: 4.0000 mg | Freq: Four times a day (QID) | INTRAMUSCULAR | Status: DC | PRN

## 2012-11-04 MED ORDER — SODIUM CHLORIDE 0.9 % IV SOLN: 250.0000 mL | INTRAVENOUS | Status: DC | PRN

## 2012-11-04 MED ORDER — FUROSEMIDE 10 MG/ML IJ SOLN
INTRAMUSCULAR | Status: AC
  Administered 2012-11-04: 60 mg
  Filled 2012-11-04: qty 8

## 2012-11-04 MED ORDER — ISOSORBIDE MONONITRATE ER 30 MG PO TB24
30.0000 mg | ORAL_TABLET | Freq: Every day | ORAL | Status: DC
  Administered 2012-11-04 – 2012-11-13 (×10): 30 mg via ORAL
  Filled 2012-11-04 (×10): qty 1

## 2012-11-04 MED ORDER — PANTOPRAZOLE SODIUM 40 MG PO TBEC
40.0000 mg | DELAYED_RELEASE_TABLET | Freq: Two times a day (BID) | ORAL | Status: DC
  Administered 2012-11-04 – 2012-11-13 (×20): 40 mg via ORAL
  Filled 2012-11-04 (×9): qty 1
  Filled 2012-11-04: qty 2
  Filled 2012-11-04 (×9): qty 1

## 2012-11-04 MED ORDER — WARFARIN SODIUM 6 MG PO TABS
6.0000 mg | ORAL_TABLET | ORAL | Status: DC
  Administered 2012-11-04 – 2012-11-11 (×6): 6 mg via ORAL
  Filled 2012-11-04 (×7): qty 1

## 2012-11-04 NOTE — Consult Note (Signed)
WOC consult Note Reason for Consult:Pustule on right lateral foot, draining.  Long dermatological history that includes "cryo" treatment of actinic keratoses on feet and hands while living in [place]. Now, with recent HF exacerbations, LEs retain fluid and the previously treated areas crack and fluid leaks from them. Wound type:infectious vs venous insufficiency Pressure Ulcer POA: No Measurement:0.5cm round with no measurable depth.  Dried serum over lesions at this time. Wound bed:Not able to see. Drainage (amount, consistency, odor) None at this time Periwound:intact with LE edema (patient and family report that this is decreasing). Dressing procedure/placement/frequency:I will conservatively implement a twice daily saline dressing to the left lateral foot lesion in hopes that we can draw out any accumulated bacteria.  This in conjunction with the fluid management medical interventions should return this area to its previous state.  Patient education regarding the wearing of protective footwear provided; patient goes barefoot most of the time.  Taught that injury would be very difficult to heal and that shoe wear is the primary prevention method. [facility] Team will not follow.  Please re-consult if needed. Thanks, Maudie Flakes, MSN, RN, [facility], [place] [phone])

## 2012-11-04 NOTE — Progress Notes (Signed)
Pt Neuro O4x, Pt states breathing has improved since admit.

## 2012-11-04 NOTE — Care Management Utilization Note (Signed)
Utilization review completed.  

## 2012-11-04 NOTE — Progress Notes (Signed)
Pt put on continues O2 monitor

## 2012-11-04 NOTE — Progress Notes (Signed)
ANTICOAGULATION CONSULT NOTE - Initial Consult  Pharmacy Consult for Coumadin Indication: atrial fibrillation  No Known Allergies  Patient Measurements: Height: 5\' 2"  (157.5 cm) Weight: 228 lb 13.4 oz (103.8 kg) (bed) IBW/kg (Calculated) : 50.1  Vital Signs: Temp: 97.7 F (36.5 C) (06/29 0223) Temp src: Oral (06/29 0223) BP: 145/42 mmHg (06/29 0223) Pulse Rate: 61 (06/29 0223)  Labs:  Recent Labs  11/01/12 0857 11/03/12 2300  HGB 9.5* 9.2*  HCT  --  28.4*  PLT  --  175  LABPROT  --  24.2*  INR  --  2.26*  CREATININE  --  2.00*  TROPONINI  --  <0.30    Estimated Creatinine Clearance: 23.2 ml/min (by C-G formula based on Cr of 2).   Medical History: Past Medical History  Diagnosis Date  . Hypertension   . Diabetes mellitus   . CHF (congestive heart failure)   . Thyroid disease   . Atrial fibrillation   . Kidney failure     Medications:  Prescriptions prior to admission  Medication Sig Dispense Refill  . amLODipine (NORVASC) 10 MG tablet Take 10 mg by mouth daily.      . carvedilol (COREG) 6.25 MG tablet Take 6.25 mg by mouth 2 (two) times daily with a meal.      . cholecalciferol (VITAMIN D) 1000 UNITS tablet Take 1,000 Units by mouth daily.      . ferrous sulfate 325 (65 FE) MG tablet Take 325 mg by mouth daily with breakfast.      . furosemide (LASIX) 40 MG tablet Take 120 mg by mouth every morning.       . furosemide (LASIX) 80 MG tablet Take 80 mg by mouth every evening.      Marland Kitchen glimepiride (AMARYL) 4 MG tablet Take 4 mg by mouth 2 (two) times daily.      . insulin glargine (LANTUS) 100 UNIT/ML injection Inject 25 Units into the skin at bedtime.       . isosorbide mononitrate (IMDUR) 30 MG 24 hr tablet Take 30 mg by mouth daily.      Marland Kitchen levothyroxine (SYNTHROID, LEVOTHROID) 137 MCG tablet Take 137-274 mcg by mouth daily before breakfast. Takes 2 tablets on Sunday, takes 1 tablet on all remaining days.      . nitroGLYCERIN (NITROSTAT) 0.4 MG SL tablet Place  0.4 mg under the tongue every 5 (five) minutes as needed for chest pain.      Marland Kitchen nystatin-triamcinolone (MYCOLOG II) cream Apply 1 application topically 2 (two) times daily.      . pantoprazole (PROTONIX) 40 MG tablet Take 40 mg by mouth 2 (two) times daily.      . pravastatin (PRAVACHOL) 40 MG tablet Take 40 mg by mouth daily.      . silver sulfADIAZINE (SILVADENE) 1 % cream Apply 1 application topically daily.      . sitaGLIPtin (JANUVIA) 50 MG tablet Take 25 mg by mouth daily.      . traMADol (ULTRAM) 50 MG tablet Take 1 tablet (50 mg total) by mouth every 6 (six) hours as needed for pain.  30 tablet  0  . warfarin (COUMADIN) 3 MG tablet Take 6-9 mg by mouth daily. Takes 2 tablets daily, except takes 3 tablets on Wednesday and Saturday.        Assessment: 79 yo female admitted with SOB, h/o Afib, to continue Coumadin  Goal of Therapy:  INR 2-3 Monitor platelets by anticoagulation protocol: Yes   Plan:  Continue  home regimen Daily INR  Caryl Pina 11/04/2012,3:21 AM

## 2012-11-04 NOTE — ED Notes (Signed)
Assigned to bed 4740 @ [facility], RN aware, [facility] called for transport.

## 2012-11-04 NOTE — H&P (Addendum)
[facility] History and Physical  Madeline [name] [id number] DOB: 09-Dec-1925 DOA: 11/03/2012  Referring physician: ED PCP: No primary provider on file.  Chief Complaint: SOB  HPI: Madeline [name] is a 79 y.o. female recently admitted 1 month ago with CHF who presents to [facility] with 6 day history of SOB, increasing pedal edema, and 8 lb weight gain.  Yesterday her PCP tried switching her po diureitc from lasix to demedex without improvement.  2 days ago she received a lasix injection in the office which was not effective.  Regarding her CHF: patient has known history of grade 3 diastolic CHF with normal EF on 2d ECHO last month.  This is complicated by CKD stage 4.  In the ED she was confirmed to have worsening peripheral and pulmonary edema, this improved with IV lasix, she has been sent to [facility] for admission for CHF.  Review of Systems: 12 systems reviewed and otherwise negative.  Past Medical History  Diagnosis Date  . Hypertension   . Diabetes mellitus   . CHF (congestive heart failure)   . Thyroid disease   . Atrial fibrillation   . Kidney failure    Past Surgical History  Procedure Laterality Date  . Abdominal hysterectomy    . Thyroid surgery     Social History:  reports that she has never smoked. She has never used smokeless tobacco. She reports that she does not drink alcohol or use illicit drugs.   No Known Allergies  History reviewed. No pertinent family history.  Prior to Admission medications   Medication Sig Start Date End Date Taking? Authorizing Provider  amLODipine (NORVASC) 10 MG tablet Take 10 mg by mouth daily.    Historical Provider, MD  carvedilol (COREG) 6.25 MG tablet Take 6.25 mg by mouth 2 (two) times daily with a meal.    Historical Provider, MD  cholecalciferol (VITAMIN D) 1000 UNITS tablet Take 1,000 Units by mouth daily.    Historical Provider, MD  ferrous sulfate 325 (65 FE) MG tablet Take 325 mg by mouth daily with breakfast.    Historical  Provider, MD  furosemide (LASIX) 40 MG tablet Take 120 mg by mouth every morning.     Historical Provider, MD  furosemide (LASIX) 80 MG tablet Take 80 mg by mouth every evening.    Historical Provider, MD  glimepiride (AMARYL) 4 MG tablet Take 4 mg by mouth 2 (two) times daily.    Historical Provider, MD  insulin glargine (LANTUS) 100 UNIT/ML injection Inject 25 Units into the skin at bedtime.     Historical Provider, MD  isosorbide mononitrate (IMDUR) 30 MG 24 hr tablet Take 30 mg by mouth daily.    Historical Provider, MD  levothyroxine (SYNTHROID, LEVOTHROID) 137 MCG tablet Take 137-274 mcg by mouth daily before breakfast. Takes 2 tablets on Sunday, takes 1 tablet on all remaining days.    Historical Provider, MD  nitroGLYCERIN (NITROSTAT) 0.4 MG SL tablet Place 0.4 mg under the tongue every 5 (five) minutes as needed for chest pain.    Historical Provider, MD  nystatin-triamcinolone (MYCOLOG II) cream Apply 1 application topically 2 (two) times daily.    Historical Provider, MD  pantoprazole (PROTONIX) 40 MG tablet Take 40 mg by mouth 2 (two) times daily.    Historical Provider, MD  pravastatin (PRAVACHOL) 40 MG tablet Take 40 mg by mouth daily.    Historical Provider, MD  silver sulfADIAZINE (SILVADENE) 1 % cream Apply 1 application topically daily.    Historical Provider, MD  sitaGLIPtin (JANUVIA) 50 MG tablet Take 25 mg by mouth daily.    Historical Provider, MD  traMADol (ULTRAM) 50 MG tablet Take 1 tablet (50 mg total) by mouth every 6 (six) hours as needed for pain. 10/05/12   Orson Eva, MD  warfarin (COUMADIN) 3 MG tablet Take 6-9 mg by mouth daily. Takes 2 tablets daily, except takes 3 tablets on Wednesday and Saturday.    Historical Provider, MD   Physical Exam: Filed Vitals:   11/04/12 0030 11/04/12 0033 11/04/12 0100 11/04/12 0223  BP: 142/62  135/44 145/42  Pulse: 60 65 56 61  Temp:    97.7 F (36.5 C)  TempSrc:    Oral  Resp: 25 23 21 18   Height:    5\' 2"  (1.575 m)  Weight:     103.8 kg (228 lb 13.4 oz)  SpO2: 97% 96% 96% 94%    General:  NAD, resting comfortably in bed Eyes: PEERLA EOMI ENT: mucous membranes moist Neck: supple w/o JVD Cardiovascular: RRR w/o MRG Respiratory: rhonchi at bases Abdomen: soft, nt, obese, edematous, bs+ Skin: skin breakdown and lesion on RLE foot, no obvious signs of infection, has 4+ pitting edema BLE Musculoskeletal: MAE, full ROM all 4 extremities Psychiatric: normal tone and affect Neurologic: AAOx3, grossly non-focal  Labs on Admission:  Basic Metabolic Panel:  Recent Labs Lab 11/03/12 2300  NA 140  K 3.9  CL 98  CO2 30  GLUCOSE 230*  BUN 26*  CREATININE 2.00*  CALCIUM 9.6   Liver Function Tests: No results found for this basename: AST, ALT, ALKPHOS, BILITOT, PROT, ALBUMIN,  in the last 168 hours No results found for this basename: LIPASE, AMYLASE,  in the last 168 hours No results found for this basename: AMMONIA,  in the last 168 hours CBC:  Recent Labs Lab 11/01/12 0857 11/03/12 2300  WBC  --  7.4  HGB 9.5* 9.2*  HCT  --  28.4*  MCV  --  95.6  PLT  --  175   Cardiac Enzymes:  Recent Labs Lab 11/03/12 2300  TROPONINI <0.30    BNP (last 3 results)  Recent Labs  10/03/12 2020 10/22/12 1317 11/03/12 2300  PROBNP 5306.0* 4388.0* 2029.0*   CBG:  Recent Labs Lab 11/03/12 2233  GLUCAP 250*    Radiological Exams on Admission: Dg Chest 2 View  11/03/2012   *RADIOLOGY REPORT*  Clinical Data: Leg swelling, shortness of breath, history of CHF and atrial fibrillation  CHEST - 2 VIEW  Comparison: 10/22/2012  Findings: Grossly unchanged enlarged cardiac silhouette and mediastinal contours with atherosclerotic calcification within the thoracic aorta.  The pulmonary vasculature is indistinct with cephalization of flow.  Evaluation of retrosternal clear space obscured secondary to overlying soft tissues. Minimal bibasilar opacities, right greater than left, favored to represent atelectasis.   Suspected trace right-sided pleural effusion.  No pneumothorax.  Unchanged bones.  IMPRESSION: Mild pulmonary edema with suspected trace right-sided pleural effusion.   Original Report Authenticated By: Jake Seats, MD    EKG: Independently reviewed.  Assessment/Plan Principal Problem:   Acute diastolic CHF (congestive heart failure) Active Problems:   CKD (chronic kidney disease) stage 4, GFR 15-29 ml/min   DM2 (diabetes mellitus, type 2)   1. Acute on chronic diastolic CHF - with preserved EF by echo 10/04/12, admitting to inpatient, putting on IV lasix 60mg  q12h, this may need to be increased based on UOP, measuring Is and Os, suspect that reason PO meds were not working is due  to lack of absorption secondary to GI edema (since everything else including her abdomen is edematous).  Already on beta blocker, cannot put on ACEi secondary to renal insufficiency. 2. CKD stage 4 - creatinine is actually the best it has been in this past month (from when we have records), continue to monitor this and UOP closely as it will undoubtedly rise some with increase in diuretics. 3. DM2 - have patient on med dose SSI while here instead of lantus and POs that she takes at home.  Code Status: Full Code (must indicate code status--if unknown or must be presumed, indicate so) Family Communication: Spoke with daughters at bedside (indicate person spoken with, if applicable, with phone number if by telephone) Disposition Plan: Admit to obs (indicate anticipated LOS)  Time spent: 70 min  Landyn Buckalew M. [facility] Pager [phone]  If 7PM-7AM, please contact night-coverage [email or web address] Password [facility] 11/04/2012, 3:17 AM

## 2012-11-04 NOTE — Progress Notes (Signed)
[facility] PROGRESS NOTE  Madeline [name] [id number] DOB: 04-04-1926 DOA: 11/03/2012 PCP: No primary provider on file.  Assessment/Plan: Acute on chronic diastolic CHF  -99991111 echo--EF 0000000, grade 3 diastolic dysfunction -Continue Imdur and carvedilol  -Increase furosemide to 60 mg IV Q8 hours -fluid restrict, daily weights -accurate I/Os CKD stage IV -Previous serum creatinine 2.57 on 10/05/2012 and 2.12 on 10/22/2012  -Renal ultrasound- negative for hydronephrosis  -Patient normally follows [place] [facility] for her CKD  Generalize weakness  -Likely multifactorial  -PT evaluation  Permanent Atrial fibrillation  -Rate controlled  -Continue carvedilol  -Continue warfarin  Diabetes mellitus type 2  -Hemoglobin A1c 6.0 in 10/04/2012 -Hold glimeperide and Januvia during hospitalization -Will give half home dose of Lantus, 12 units Hypothyroidism  -TSH 1.96 on 10/04/2012 -Patient's levothyroxine was increased approximately 3 months ago  Normocytic anemia  -Patient had hemoglobin 10.3 in March 2014  -Check iron studies--iron saturation 11%, B12--940  -RBC folate--2999 -Increase ferrous sulfate to twice a day  Family Communication:   Daughter at beside Disposition Plan:   Home when medically stable            Procedures/Studies: Dg Chest 2 View  11/03/2012   *RADIOLOGY REPORT*  Clinical Data: Leg swelling, shortness of breath, history of CHF and atrial fibrillation  CHEST - 2 VIEW  Comparison: 10/22/2012  Findings: Grossly unchanged enlarged cardiac silhouette and mediastinal contours with atherosclerotic calcification within the thoracic aorta.  The pulmonary vasculature is indistinct with cephalization of flow.  Evaluation of retrosternal clear space obscured secondary to overlying soft tissues. Minimal bibasilar opacities, right greater than left, favored to represent atelectasis.  Suspected trace right-sided pleural effusion.  No pneumothorax.   Unchanged bones.  IMPRESSION: Mild pulmonary edema with suspected trace right-sided pleural effusion.   Original Report Authenticated By: Jake Seats, MD   Dg Chest 2 View  10/22/2012   *RADIOLOGY REPORT*  Clinical Data: Shortness of breath.  CHEST - 2 VIEW  Comparison: 10/03/2012 and 08/30/2012 radiographs.  Abdominal CT 01/12/2012.  Findings: Cardiomegaly and aortic atherosclerosis are stable. There is chronic central airway thickening which appears mildly progressive.  There is new mild blunting of the right costophrenic angle which could reflect a small right pleural effusion.  There is no focal airspace disease or pneumothorax.  Mild degenerative changes in the spine are noted.  IMPRESSION: Increased central airway thickening suspicious for bronchitis. Possible small right pleural effusion.  Stable cardiomegaly.   Original Report Authenticated By: Richardean Sale, M.D.         Subjective: Patient is breathing better. She denies any fevers, chills, chest pain, shortness breath, nausea, vomiting, diarrhea, abdominal pain, dysuria.  Objective: Filed Vitals:   11/04/12 0100 11/04/12 0223 11/04/12 0514 11/04/12 0954  BP: 135/44 145/42 125/51 142/53  Pulse: 56 61 65 66  Temp:  97.7 F (36.5 C) 97.6 F (36.4 C)   TempSrc:  Oral Oral   Resp: 21 18 18 21   Height:  5\' 2"  (1.575 m)    Weight:  103.8 kg (228 lb 13.4 oz)    SpO2: 96% 94% 96% 97%    Intake/Output Summary (Last 24 hours) at 11/04/12 1325 Last data filed at 11/04/12 1000  Gross per 24 hour  Intake    460 ml  Output    875 ml  Net   -415 ml   Weight change:  Exam:   General:  Pt is alert, follows commands appropriately, not in acute distress  HEENT: No icterus, No  thrush,  [place]/AT  Cardiovascular: IRRR, no rub  Respiratory: Bibasilar crackles right greater than left. No wheezes. Good air movement. Diminished breath sounds right base.  Abdomen: Soft/+BS, non tender, non distended, no guarding  Extremities: 2+edema,  No lymphangitis, No petechiae, No rashes, no synovitis  Data Reviewed: Basic Metabolic Panel:  Recent Labs Lab 11/03/12 2300  NA 140  K 3.9  CL 98  CO2 30  GLUCOSE 230*  BUN 26*  CREATININE 2.00*  CALCIUM 9.6   Liver Function Tests: No results found for this basename: AST, ALT, ALKPHOS, BILITOT, PROT, ALBUMIN,  in the last 168 hours No results found for this basename: LIPASE, AMYLASE,  in the last 168 hours No results found for this basename: AMMONIA,  in the last 168 hours CBC:  Recent Labs Lab 11/01/12 0857 11/03/12 2300  WBC  --  7.4  HGB 9.5* 9.2*  HCT  --  28.4*  MCV  --  95.6  PLT  --  175   Cardiac Enzymes:  Recent Labs Lab 11/03/12 2300  TROPONINI <0.30   BNP: No components found with this basename: POCBNP,  CBG:  Recent Labs Lab 11/03/12 2233 11/04/12 0622 11/04/12 1220  GLUCAP 250* 141* 138*    No results found for this or any previous visit (from the past 240 hour(s)).   Scheduled Meds: . amLODipine  10 mg Oral Daily  . carvedilol  6.25 mg Oral BID WC  . ferrous sulfate  325 mg Oral BID WC  . furosemide  60 mg Intravenous Q8H  . insulin aspart  0-15 Units Subcutaneous TID WC  . insulin glargine  12 Units Subcutaneous QHS  . isosorbide mononitrate  30 mg Oral Daily  . [START ON 11/05/2012] levothyroxine  137 mcg Oral Custom  . levothyroxine  274 mcg Oral Q7 days  . nystatin-triamcinolone  1 application Topical BID  . pantoprazole  40 mg Oral BID  . silver sulfADIAZINE  1 application Topical Daily  . simvastatin  20 mg Oral q1800  . sodium chloride  3 mL Intravenous Q12H  . warfarin  6 mg Oral Custom   And  . [START ON 11/07/2012] warfarin  9 mg Oral Custom  . Warfarin - Pharmacist Dosing Inpatient   Does not apply q1800   Continuous Infusions:    Tyrica Afzal, DO  [facility] Pager [phone]  If 7PM-7AM, please contact night-coverage [email or web address] Password [email or web address] 11/04/2012, 1:25 PM   LOS: 1 day

## 2012-11-04 NOTE — Progress Notes (Signed)
Pt O2 stat w/ activity 94%

## 2012-11-05 DIAGNOSIS — R5381 Other malaise: Secondary | ICD-10-CM

## 2012-11-05 LAB — PROTIME-INR: Prothrombin Time: 27.2 seconds — ABNORMAL HIGH (ref 11.6–15.2)

## 2012-11-05 LAB — BASIC METABOLIC PANEL
BUN: 27 mg/dL — ABNORMAL HIGH (ref 6–23)
CO2: 31 mEq/L (ref 19–32)
Chloride: 98 mEq/L (ref 96–112)
Creatinine, Ser: 2.03 mg/dL — ABNORMAL HIGH (ref 0.50–1.10)
Potassium: 3.3 mEq/L — ABNORMAL LOW (ref 3.5–5.1)

## 2012-11-05 LAB — GLUCOSE, CAPILLARY: Glucose-Capillary: 180 mg/dL — ABNORMAL HIGH (ref 70–99)

## 2012-11-05 MED ORDER — FUROSEMIDE 10 MG/ML IJ SOLN
40.0000 mg | Freq: Once | INTRAMUSCULAR | Status: AC
  Administered 2012-11-05: 40 mg via INTRAVENOUS

## 2012-11-05 MED ORDER — FUROSEMIDE 10 MG/ML IJ SOLN
100.0000 mg | Freq: Three times a day (TID) | INTRAVENOUS | Status: DC
  Administered 2012-11-05 – 2012-11-06 (×3): 100 mg via INTRAVENOUS
  Filled 2012-11-05 (×5): qty 10

## 2012-11-05 MED ORDER — INSULIN GLARGINE 100 UNIT/ML ~~LOC~~ SOLN
15.0000 [IU] | Freq: Every day | SUBCUTANEOUS | Status: DC
  Administered 2012-11-05 – 2012-11-09 (×5): 15 [IU] via SUBCUTANEOUS
  Filled 2012-11-05 (×6): qty 0.15

## 2012-11-05 MED ORDER — FUROSEMIDE 10 MG/ML IJ SOLN: 80.0000 mg | Freq: Three times a day (TID) | INTRAMUSCULAR | Status: DC

## 2012-11-05 MED ORDER — FUROSEMIDE 10 MG/ML IJ SOLN: 20.0000 mg | Freq: Once | INTRAMUSCULAR | Status: DC

## 2012-11-05 MED ORDER — POTASSIUM CHLORIDE CRYS ER 20 MEQ PO TBCR
20.0000 meq | EXTENDED_RELEASE_TABLET | Freq: Every day | ORAL | Status: DC
  Administered 2012-11-05: 20 meq via ORAL
  Filled 2012-11-05 (×2): qty 1

## 2012-11-05 NOTE — Progress Notes (Signed)
Pt seating at edge of bed. No complaint of pain or SOB. Pt still w/ dyspnea  W/ exertion. Labored  Breathing at rest improving. Pt states she is feeling better.

## 2012-11-05 NOTE — Progress Notes (Signed)
ANTICOAGULATION CONSULT NOTE - Follow Up Consult  Pharmacy Consult for coumadin Indication: atrial fibrillation  No Known Allergies  Patient Measurements: Height: 5\' 2"  (157.5 cm) Weight: 227 lb (102.967 kg) (b scale) IBW/kg (Calculated) : 50.1  Vital Signs: Temp: 98.5 F (36.9 C) (06/30 0913) Temp src: Oral (06/30 0913) BP: 116/68 mmHg (06/30 0913) Pulse Rate: 57 (06/30 0913)  Labs:  Recent Labs  11/03/12 2300 11/05/12 0710  HGB 9.2*  --   HCT 28.4*  --   PLT 175  --   LABPROT 24.2* 27.2*  INR 2.26* 2.63*  CREATININE 2.00* 2.03*  TROPONINI <0.30  --     Estimated Creatinine Clearance: 22.8 ml/min (by C-G formula based on Cr of 2.03).   Medications:  Scheduled:  . amLODipine  10 mg Oral Daily  . carvedilol  6.25 mg Oral BID WC  . ferrous sulfate  325 mg Oral BID WC  . furosemide  60 mg Intravenous Q8H  . insulin aspart  0-15 Units Subcutaneous TID WC  . insulin glargine  12 Units Subcutaneous QHS  . isosorbide mononitrate  30 mg Oral Daily  . levothyroxine  137 mcg Oral Custom  . levothyroxine  274 mcg Oral Q7 days  . nystatin-triamcinolone  1 application Topical BID  . pantoprazole  40 mg Oral BID  . potassium chloride  20 mEq Oral Daily  . silver sulfADIAZINE  1 application Topical Daily  . simvastatin  20 mg Oral q1800  . sodium chloride  3 mL Intravenous Q12H  . warfarin  6 mg Oral Custom   And  . [START ON 11/07/2012] warfarin  9 mg Oral Custom  . Warfarin - Pharmacist Dosing Inpatient   Does not apply q1800    Assessment: 79 yo female here with HF on coumadin for history of afib. INR today is 2.63 and patient is on home regimen.  Goal of Therapy:  INR 2-3 Monitor platelets by anticoagulation protocol: Yes   Plan:  -No coumadin changes  -Will follow PT/INR daily for now  Hildred Laser, Pharm D 11/05/2012 10:18 AM

## 2012-11-05 NOTE — Progress Notes (Signed)
PT Cancellation Note  Patient Details Name: Madeline [name] MRN: [id number] DOB: 15-Mar-1926   Cancelled Treatment:    Reason Eval/Treat Not Completed: Fatigue/lethargy limiting ability to participate  Pt politely declined today due to just getting back into bed after using BSC and very fatigued.  Would like therapy to return tomorrow.   Mikyle Sox,KATHrine E 11/05/2012, 3:32 PM Carmelia Bake, PT, DPT 11/05/2012 Pager: [phone]

## 2012-11-05 NOTE — Progress Notes (Signed)
[facility] PROGRESS NOTE  Madeline [name] [id number] DOB: 1926/04/09 DOA: 11/03/2012 PCP: No primary provider on file.  Assessment/Plan: Acute on chronic diastolic CHF  -99991111 echo--EF 0000000, grade 3 diastolic dysfunction  -Continue Imdur and carvedilol  -Increase furosemide to 100 mg IV Q8 hours  -fluid restrict, daily weights  -accurate I/Os  CKD stage IV  -Previous serum creatinine 2.57 on 10/05/2012 and 2.12 on 10/22/2012  -Renal ultrasound- negative for hydronephrosis  -Patient normally follows [facility] for her CKD  Generalize weakness  -Likely multifactorial  -PT evaluation  Permanent Atrial fibrillation  -Rate controlled  -Continue carvedilol  -Continue warfarin  Diabetes mellitus type 2  -Hemoglobin A1c 6.0 in 10/04/2012  -Hold glimeperide and Januvia during hospitalization  -increase Lantus to 15units q hs  Hypothyroidism  -TSH 1.96 on 10/04/2012  -Patient's levothyroxine was increased approximately 3 months ago  Normocytic anemia  -Patient had hemoglobin 10.3 in March 2014  -Check iron studies--iron saturation 11%, B12--940  -RBC folate--2999  -Increase ferrous sulfate to twice a day  Family Communication:   Pt at beside Disposition Plan:   Home when medically stable         Procedures/Studies: Dg Chest 2 View  11/03/2012   *RADIOLOGY REPORT*  Clinical Data: Leg swelling, shortness of breath, history of CHF and atrial fibrillation  CHEST - 2 VIEW  Comparison: 10/22/2012  Findings: Grossly unchanged enlarged cardiac silhouette and mediastinal contours with atherosclerotic calcification within the thoracic aorta.  The pulmonary vasculature is indistinct with cephalization of flow.  Evaluation of retrosternal clear space obscured secondary to overlying soft tissues. Minimal bibasilar opacities, right greater than left, favored to represent atelectasis.  Suspected trace right-sided pleural effusion.  No pneumothorax.  Unchanged bones.   IMPRESSION: Mild pulmonary edema with suspected trace right-sided pleural effusion.   Original Report Authenticated By: Jake Seats, MD   Dg Chest 2 View  10/22/2012   *RADIOLOGY REPORT*  Clinical Data: Shortness of breath.  CHEST - 2 VIEW  Comparison: 10/03/2012 and 08/30/2012 radiographs.  Abdominal CT 01/12/2012.  Findings: Cardiomegaly and aortic atherosclerosis are stable. There is chronic central airway thickening which appears mildly progressive.  There is new mild blunting of the right costophrenic angle which could reflect a small right pleural effusion.  There is no focal airspace disease or pneumothorax.  Mild degenerative changes in the spine are noted.  IMPRESSION: Increased central airway thickening suspicious for bronchitis. Possible small right pleural effusion.  Stable cardiomegaly.   Original Report Authenticated By: Richardean Sale, M.D.         Subjective: Patient is feeling slightly better today. She is breathing better. She still has some dyspnea on exertion. Denies fevers, chills, chest pain, nausea, vomiting, diarrhea, dizziness. She has a nonproductive cough.  Objective: Filed Vitals:   11/05/12 0619 11/05/12 0913 11/05/12 1100 11/05/12 1407  BP: 133/51 116/68 124/69   Pulse: 74 57 59   Temp: 98 F (36.7 C) 98.5 F (36.9 C)    TempSrc: Oral Oral    Resp: 20 16 20 21   Height:      Weight: 102.967 kg (227 lb)     SpO2: 97% 99% 97% 92%    Intake/Output Summary (Last 24 hours) at 11/05/12 1428 Last data filed at 11/05/12 1200  Gross per 24 hour  Intake    860 ml  Output   2625 ml  Net  -1765 ml   Weight change: -0.454 kg (-1 lb) Exam:   General:  Pt is alert,  follows commands appropriately, not in acute distress  HEENT: No icterus, No thrush, [place]/AT  Cardiovascular: RRR, S1/S2, no rubs, no gallops  Respiratory: Bibasilar crackles, right greater than left. No wheezes. Good air movement.  Abdomen: Soft/+BS, non tender, non distended, no  guarding  Extremities: 2+ edema, No lymphangitis, No petechiae, No rashes, no synovitis  Data Reviewed: Basic Metabolic Panel:  Recent Labs Lab 11/03/12 2300 11/05/12 0710  NA 140 140  K 3.9 3.3*  CL 98 98  CO2 30 31  GLUCOSE 230* 135*  BUN 26* 27*  CREATININE 2.00* 2.03*  CALCIUM 9.6 9.1   Liver Function Tests: No results found for this basename: AST, ALT, ALKPHOS, BILITOT, PROT, ALBUMIN,  in the last 168 hours No results found for this basename: LIPASE, AMYLASE,  in the last 168 hours No results found for this basename: AMMONIA,  in the last 168 hours CBC:  Recent Labs Lab 11/01/12 0857 11/03/12 2300  WBC  --  7.4  HGB 9.5* 9.2*  HCT  --  28.4*  MCV  --  95.6  PLT  --  175   Cardiac Enzymes:  Recent Labs Lab 11/03/12 2300  TROPONINI <0.30   BNP: No components found with this basename: POCBNP,  CBG:  Recent Labs Lab 11/04/12 1220 11/04/12 1612 11/04/12 2128 11/05/12 0639 11/05/12 1053  GLUCAP 138* 170* 223* 139* 145*    No results found for this or any previous visit (from the past 240 hour(s)).   Scheduled Meds: . amLODipine  10 mg Oral Daily  . carvedilol  6.25 mg Oral BID WC  . ferrous sulfate  325 mg Oral BID WC  . furosemide  100 mg Intravenous Q8H  . furosemide  40 mg Intravenous Once  . insulin aspart  0-15 Units Subcutaneous TID WC  . insulin glargine  15 Units Subcutaneous QHS  . isosorbide mononitrate  30 mg Oral Daily  . levothyroxine  137 mcg Oral Custom  . levothyroxine  274 mcg Oral Q7 days  . nystatin-triamcinolone  1 application Topical BID  . pantoprazole  40 mg Oral BID  . potassium chloride  20 mEq Oral Daily  . silver sulfADIAZINE  1 application Topical Daily  . simvastatin  20 mg Oral q1800  . sodium chloride  3 mL Intravenous Q12H  . warfarin  6 mg Oral Custom   And  . [START ON 11/07/2012] warfarin  9 mg Oral Custom  . Warfarin - Pharmacist Dosing Inpatient   Does not apply q1800   Continuous Infusions:    Madeline [name],  Madeline Lafond, DO  [facility] Pager [phone]  If 7PM-7AM, please contact night-coverage [email or web address] Password [email or web address] 11/05/2012, 2:28 PM   LOS: 2 days

## 2012-11-05 NOTE — Progress Notes (Signed)
K3.3, will inform MD. md started 23meq daily

## 2012-11-05 NOTE — Progress Notes (Signed)
Patient evaluated for community based chronic disease management services with [facility] as a benefit of patient's [employer]. Spoke with patient's daughter and spouse at bedside to explain [place] [facility] services.  Daughter indicated that she has a twin sister that the patient and her spouse live with in [place].  Patient deferred conversation to her daughter.  They feel that they can self manage and formally decline at this time.  Left contact information and [facility] literature at bedside. Made inpatient Case Manager aware that [facility] following. Of note, [facility] services does not replace or interfere with any services that are arranged by inpatient case management or social work.  For additional questions or referrals please contact Corliss Blacker BSN RN [facility] Liaison at [phone].

## 2012-11-05 NOTE — Progress Notes (Signed)
Put Pt on RA stat 92% at rest. Put Pt back on 2L

## 2012-11-06 ENCOUNTER — Inpatient Hospital Stay (HOSPITAL_COMMUNITY): Payer: Medicare Other

## 2012-11-06 DIAGNOSIS — I5033 Acute on chronic diastolic (congestive) heart failure: Secondary | ICD-10-CM

## 2012-11-06 LAB — CBC
MCV: 94.3 fL (ref 78.0–100.0)
Platelets: 151 10*3/uL (ref 150–400)
RDW: 18.1 % — ABNORMAL HIGH (ref 11.5–15.5)
WBC: 6.9 10*3/uL (ref 4.0–10.5)

## 2012-11-06 LAB — PROTIME-INR: INR: 2.54 — ABNORMAL HIGH (ref 0.00–1.49)

## 2012-11-06 LAB — BASIC METABOLIC PANEL
GFR calc Af Amer: 25 mL/min — ABNORMAL LOW (ref >90)
GFR calc non Af Amer: 21 mL/min — ABNORMAL LOW (ref >90)
Potassium: 3.2 mEq/L — ABNORMAL LOW (ref 3.5–5.1)
Sodium: 139 mEq/L (ref 135–145)

## 2012-11-06 LAB — GLUCOSE, CAPILLARY: Glucose-Capillary: 159 mg/dL — ABNORMAL HIGH (ref 70–99)

## 2012-11-06 LAB — MAGNESIUM: Magnesium: 2.1 mg/dL (ref 1.5–2.5)

## 2012-11-06 MED ORDER — FUROSEMIDE 10 MG/ML IJ SOLN
120.0000 mg | Freq: Three times a day (TID) | INTRAVENOUS | Status: DC
  Filled 2012-11-06 (×2): qty 12

## 2012-11-06 MED ORDER — INSULIN ASPART 100 UNIT/ML ~~LOC~~ SOLN: 0.0000 [IU] | Freq: Every day | SUBCUTANEOUS | Status: DC

## 2012-11-06 MED ORDER — METOLAZONE 5 MG PO TABS
5.0000 mg | ORAL_TABLET | Freq: Every day | ORAL | Status: DC
  Administered 2012-11-06 – 2012-11-10 (×5): 5 mg via ORAL
  Filled 2012-11-06 (×6): qty 1

## 2012-11-06 MED ORDER — DEXTROSE 5 % IV SOLN
120.0000 mg | Freq: Four times a day (QID) | INTRAVENOUS | Status: DC
  Administered 2012-11-06 – 2012-11-07 (×6): 120 mg via INTRAVENOUS
  Filled 2012-11-06 (×9): qty 12

## 2012-11-06 MED ORDER — SENNA 8.6 MG PO TABS
2.0000 | ORAL_TABLET | Freq: Every day | ORAL | Status: DC
  Administered 2012-11-06 – 2012-11-13 (×8): 17.2 mg via ORAL
  Filled 2012-11-06 (×8): qty 2

## 2012-11-06 MED ORDER — POTASSIUM CHLORIDE CRYS ER 20 MEQ PO TBCR
40.0000 meq | EXTENDED_RELEASE_TABLET | Freq: Every day | ORAL | Status: DC
  Administered 2012-11-06 – 2012-11-07 (×2): 40 meq via ORAL
  Filled 2012-11-06: qty 1
  Filled 2012-11-06 (×2): qty 2

## 2012-11-06 MED ORDER — INSULIN ASPART 100 UNIT/ML ~~LOC~~ SOLN
0.0000 [IU] | Freq: Every day | SUBCUTANEOUS | Status: DC
  Administered 2012-11-07 – 2012-11-10 (×4): 2 [IU] via SUBCUTANEOUS
  Administered 2012-11-11: 5 [IU] via SUBCUTANEOUS
  Administered 2012-11-12: 3 [IU] via SUBCUTANEOUS

## 2012-11-06 MED ORDER — INSULIN ASPART 100 UNIT/ML ~~LOC~~ SOLN
0.0000 [IU] | Freq: Three times a day (TID) | SUBCUTANEOUS | Status: DC
  Administered 2012-11-06: 3 [IU] via SUBCUTANEOUS
  Administered 2012-11-07 (×2): 2 [IU] via SUBCUTANEOUS
  Administered 2012-11-07 – 2012-11-08 (×2): 3 [IU] via SUBCUTANEOUS
  Administered 2012-11-08: 5 [IU] via SUBCUTANEOUS
  Administered 2012-11-08 – 2012-11-09 (×2): 3 [IU] via SUBCUTANEOUS
  Administered 2012-11-09: 5 [IU] via SUBCUTANEOUS
  Administered 2012-11-09 – 2012-11-10 (×2): 3 [IU] via SUBCUTANEOUS
  Administered 2012-11-10 (×2): 5 [IU] via SUBCUTANEOUS
  Administered 2012-11-11: 3 [IU] via SUBCUTANEOUS
  Administered 2012-11-11 – 2012-11-12 (×3): 5 [IU] via SUBCUTANEOUS
  Administered 2012-11-12: 8 [IU] via SUBCUTANEOUS
  Administered 2012-11-12: 15 [IU] via SUBCUTANEOUS
  Administered 2012-11-13: 5 [IU] via SUBCUTANEOUS
  Administered 2012-11-13: 8 [IU] via SUBCUTANEOUS

## 2012-11-06 MED ORDER — POLYETHYLENE GLYCOL 3350 17 G PO PACK
17.0000 g | PACK | Freq: Every day | ORAL | Status: DC | PRN
  Administered 2012-11-06: 17 g via ORAL
  Filled 2012-11-06 (×2): qty 1

## 2012-11-06 MED ORDER — FUROSEMIDE 10 MG/ML IJ SOLN: 40.0000 mg | Freq: Once | INTRAMUSCULAR | Status: DC

## 2012-11-06 MED ORDER — INSULIN ASPART 100 UNIT/ML ~~LOC~~ SOLN: 0.0000 [IU] | Freq: Three times a day (TID) | SUBCUTANEOUS | Status: DC

## 2012-11-06 MED ORDER — DOCUSATE SODIUM 100 MG PO CAPS
100.0000 mg | ORAL_CAPSULE | Freq: Two times a day (BID) | ORAL | Status: DC
  Administered 2012-11-06 – 2012-11-13 (×14): 100 mg via ORAL
  Filled 2012-11-06 (×15): qty 1

## 2012-11-06 NOTE — Evaluation (Addendum)
Physical Therapy Evaluation Patient Details Name: Madeline [name] MRN: [id number] DOB: [birth year] Today's Date: 11/06/2012 Time: [id number] PT Time Calculation (min): 26 min  PT Assessment / Plan / Recommendation History of Present Illness  Pt admitted for SOB and LE swelling. Pt with 2 episodes of V-tach.  Clinical Impression  Pt with noted SOB with activity limiting ambulation/activity tolerance. SpO2 stayed at >94% on 2LO2 via [place]. Pt to require 24/7 assist/supervision for safe d/c home. Pt reports she is never home alone but a few hours a day her and her husband who has dementia are alone. Awaiting to speak with daughters to verify that 24/7 assist is available. Otherwise pt with good home set up and all recommended DME.    PT Assessment  Patient needs continued PT services    Follow Up Recommendations  No PT follow up;Supervision/Assistance - 24 hour    Does the patient have the potential to tolerate intense rehabilitation      Barriers to Discharge Decreased caregiver support (questionable 24/7 supervision/assist)      Equipment Recommendations  None recommended by PT (pt has all recommended equip)    Recommendations for Other Services     Frequency Min 3X/week    Precautions / Restrictions Precautions Precautions: Fall Restrictions Weight Bearing Restrictions: No   Pertinent Vitals/Pain Denies pain      Mobility  Bed Mobility Bed Mobility: Not assessed (pt received sitting EOB eating breakfast) Transfers Transfers: Sit to Stand;Stand to Sit Sit to Stand: 4: Min guard;With upper extremity assist;From bed Stand to Sit: 5: Supervision;With upper extremity assist;With armrests;To chair/3-in-1 Details for Transfer Assistance: v/c's for hand placement ie push up from bed not pull up on walker Ambulation/Gait Ambulation/Gait Assistance: 4: Min guard Ambulation Distance (Feet): 10 Feet (x2, to/from bathroom) Assistive device: Rolling walker Ambulation/Gait Assistance  Details: + SOB, short, shuffled steps, labored effort, SpO2 >95% despite +SOB Gait Pattern: Step-to pattern;Decreased stride length;Shuffle Gait velocity: decr General Gait Details: pt requires use of RW for safe ambulation Stairs: No    Exercises     PT Diagnosis: Difficulty walking;Generalized weakness  PT Problem List: Decreased activity tolerance;Decreased balance;Decreased mobility PT Treatment Interventions: Gait training;Stair training;Functional mobility training;Therapeutic activities;Therapeutic exercise     PT Goals(Current goals can be found in the care plan section) Acute Rehab PT Goals Patient Stated Goal: home PT Goal Formulation: With patient Time For Goal Achievement: 11/20/12 Potential to Achieve Goals: Good  Visit Information  Last PT Received On: 11/06/12 Assistance Needed: +1 History of Present Illness: Pt admitted for SOB and LE swelling. Pt found to have afib upon admit.       Prior [place] expects to be discharged to:: Private residence Living Arrangements: Children Available Help at Discharge: Family;Available PRN/intermittently (potentially 24/7) Type of Home: House Home Access: Stairs to enter [employer] of Steps: 3 Entrance Stairs-Rails: Right Home Layout: One level Home Equipment: Shower seat;Cane - single point;Walker - 4 wheels;Bedside commode;Grab bars - tub/shower;Hand held shower head Prior Function Level of Independence: Independent with assistive device(s) Comments: dtr assist with bathing, cooking, cleaning Communication Communication: No difficulties Dominant Hand: Right    Cognition  Cognition Arousal/Alertness: Awake/alert Behavior During Therapy: WFL for tasks assessed/performed Overall Cognitive Status: Within Functional Limits for tasks assessed    Extremity/Trunk Assessment Upper Extremity Assessment Upper Extremity Assessment: Overall WFL for tasks assessed Lower Extremity  Assessment Lower Extremity Assessment: Overall WFL for tasks assessed (bilat LE edema noted) Cervical / Trunk Assessment Cervical / Trunk Assessment:  Normal   Balance    End of Session PT - End of Session Equipment Utilized During Treatment: Gait belt;Oxygen (2Lo2 via [place]) Activity Tolerance: Patient limited by fatigue Patient left: in chair;with call bell/phone within reach Nurse Communication: Mobility status  GP     Kingsley Callander 11/06/2012, 8:57 AM  Kittie Plater, PT, DPT Pager #: [phone] Office #: [phone]

## 2012-11-06 NOTE — Evaluation (Signed)
Occupational Therapy Evaluation Patient Details Name: Madeline [name] MRN: [id number] DOB: 06-20-1925 Today's Date: 11/06/2012 Time: [id number] OT Time Calculation (min): 44 min  OT Assessment / Plan / Recommendation History of present illness Pt admitted for SOB and LE swelling. Pt found to have afib and with 2 episodes of V-tach.   Clinical Impression   Pt with noted SOB with activity limiting ambulation/activity tolerance. SpO2 stayed at >94% on room air. Pt to require 24/7 assist/supervision for safe d/c home. Pt reports she is never home alone and her daughter verifies this today. Patient's husband has dementia and they both live with patient's daughter.  Pt with good home set up and has all recommended DME.  Patient has 16 children which includes 3 sets of twins!    OT Assessment  Patient needs continued OT Services    Follow Up Recommendations  Home health OT    Frequency  Min 2X/week    Precautions / Restrictions Precautions Precautions: Fall Restrictions Weight Bearing Restrictions: No   Pertinent Vitals/Pain Denies pain    ADL  Grooming: Performed;Wash/dry hands;Wash/dry face;Teeth care Where Assessed - Grooming: Supported standing Upper Body Bathing: Simulated;Set up Where Assessed - Upper Body Bathing: Unsupported sitting Lower Body Bathing: Simulated;Minimal assistance Where Assessed - Lower Body Bathing: Unsupported sitting;Supported standing;Supported sit to stand Upper Body Dressing: Simulated;Set up Where Assessed - Upper Body Dressing: Supported standing;Unsupported sitting Lower Body Dressing: Simulated;Performed Where Assessed - Lower Body Dressing: Supported standing;Unsupported sitting;Supported sit to stand Transfers/Ambulation Related to ADLs: Daughter reports that patient uses a walker at home for all functional mobility.  During evaluation, patient ambulated to sink and around in room with HHA ADL Comments: Patient reports that she gave herself a sponge  bath yesterday sitting EOB and washed everything except below her knees.  Patient's SPO2 below 90% while seated and bent over to donn gripper socks and required 3 rest breaks and vcs for perform PLB techniques.  After BADL tasks, SPO2 94-97% on room air.    OT Diagnosis: Generalized weakness  OT Problem List: Decreased strength;Decreased activity tolerance;Impaired balance (sitting and/or standing);Obesity OT Treatment Interventions: Self-care/ADL training;Therapeutic exercise;Energy conservation;DME and/or AE instruction;Therapeutic activities;Patient/family education;Balance training   OT Goals(Current goals can be found in the care plan section) Acute Rehab OT Goals Patient Stated Goal: I want to go back to doing things for myself OT Goal Formulation: With patient/family Time For Goal Achievement: 11/20/12 Potential to Achieve Goals: Good ADL Goals Pt Will Perform Upper Body Bathing: with modified independence;standing Pt Will Perform Lower Body Bathing: with min assist;sit to/from stand Pt Will Perform Upper Body Dressing: with modified independence;sitting;standing Pt Will Perform Lower Body Dressing: with modified independence;sit to/from stand Pt Will Transfer to Toilet: with modified independence Pt Will Perform Toileting - Clothing Manipulation and hygiene: with modified independence;sit to/from stand Pt Will Perform Tub/Shower Transfer: with min assist;ambulating;shower seat;grab bars  Visit Information  Last OT Received On: 11/06/12 Assistance Needed: +1 History of Present Illness: Pt admitted for SOB and LE swelling. Pt found to have afib and with 2 episodes of V-tach.       Prior [place] expects to be discharged to:: Private residence (daughter's home) Living Arrangements: Children Available Help at Discharge: Family;Available 24 hours/day (potentially 24/7) Type of Home: House Home Access: Stairs to enter [employer] of  Steps: 3 Entrance Stairs-Rails: Left Home Layout: One level Home Equipment: Shower seat;Cane - single point;Walker - 4 wheels;Bedside commode;Grab bars - tub/shower;Hand held shower head  Prior Function Level of Independence: Needs assistance Gait / Transfers Assistance Needed: uses walker all of the time. ADL's / Homemaking Assistance Needed: assist with LB bath, ~1 week before admission, patient cooked breakfast for her and her husband Comments: Patient had 3 set of twins and a total of 16 children! Communication Communication: No difficulties Dominant Hand: Right    Vision/Perception Vision - History Baseline Vision: Wears glasses all the time Visual History: Cataracts Patient Visual Report: No change from baseline   Cognition  Cognition Arousal/Alertness: Awake/alert Behavior During Therapy: WFL for tasks assessed/performed Overall Cognitive Status: Within Functional Limits for tasks assessed    Extremity/Trunk Assessment Upper Extremity Assessment Upper Extremity Assessment: Overall WFL for tasks assessed Lower Extremity Assessment Lower Extremity Assessment: Overall WFL for tasks assessed Cervical / Trunk Assessment Cervical / Trunk Assessment: Normal     Mobility Sit to Stand: 4: Min guard;With upper extremity assist;From bed Stand to Sit: 5: Supervision;With upper extremity assist;With armrests;To chair/3-in-1 Details for Transfer Assistance: v/c's for hand placement ie push up from bed not pull up on walker     End of Session OT - End of Session Activity Tolerance: Patient limited by fatigue Patient left: in chair;with family/visitor present;with call bell/phone within reach  [place], [place] 11/06/2012, 1:51 PM

## 2012-11-06 NOTE — Progress Notes (Signed)
[facility] PROGRESS NOTE  Madeline [name] [id number] DOB: 1925-10-06 DOA: 11/03/2012 PCP: Abigail Miyamoto, MD  Assessment/Plan: Acute on chronic diastolic CHF  -99991111 echo--EF 0000000, grade 3 diastolic dysfunction  -Continue Imdur and carvedilol  -Increase furosemide to 120mg  IV q 6hrs -add metolazone -fluid restrict, daily weights  -pt is quite diuretic resistant-->add metolazone -gained 0.2 kg since yesterday (100mg  IV tid lasix) -home dose of furosemide is 120mg  po bid -clinically remains fluid overloaded -accurate I/Os  CKD stage IV  -Previous serum creatinine 2.57 on 10/05/2012 and 2.12 on 10/22/2012  -serum creatinine remains stable on IV furosemide -Renal ultrasound- negative for hydronephrosis  -Patient normally follows [place] [facility] for her CKD  Generalize weakness  -Likely multifactorial  -PT evaluation  Permanent Atrial fibrillation  -Rate controlled  -Continue carvedilol  -Continue warfarin  Diabetes mellitus type 2  -Hemoglobin A1c 6.0 in 10/04/2012  -Hold glimeperide and Januvia during hospitalization  -increase Lantus to 15units q hs  -At bedtime NovoLog -Continue NovoLog sliding scale Hypothyroidism  -TSH 1.96 on 10/04/2012  -Patient's levothyroxine was increased approximately 3 months ago  Normocytic anemia  -Patient had hemoglobin 10.3 in March 2014  -Check iron studies--iron saturation 11%, B12--940  -RBC folate--2999  -Increase ferrous sulfate to twice a day  Constipation -Add Colace and Senokot Family Communication: daughter at beside  Disposition Plan: Home when medically stable          Procedures/Studies: Dg Chest 2 View  11/03/2012   *RADIOLOGY REPORT*  Clinical Data: Leg swelling, shortness of breath, history of CHF and atrial fibrillation  CHEST - 2 VIEW  Comparison: 10/22/2012  Findings: Grossly unchanged enlarged cardiac silhouette and mediastinal contours with atherosclerotic calcification within the thoracic  aorta.  The pulmonary vasculature is indistinct with cephalization of flow.  Evaluation of retrosternal clear space obscured secondary to overlying soft tissues. Minimal bibasilar opacities, right greater than left, favored to represent atelectasis.  Suspected trace right-sided pleural effusion.  No pneumothorax.  Unchanged bones.  IMPRESSION: Mild pulmonary edema with suspected trace right-sided pleural effusion.   Original Report Authenticated By: Jake Seats, MD   Dg Chest 2 View  10/22/2012   *RADIOLOGY REPORT*  Clinical Data: Shortness of breath.  CHEST - 2 VIEW  Comparison: 10/03/2012 and 08/30/2012 radiographs.  Abdominal CT 01/12/2012.  Findings: Cardiomegaly and aortic atherosclerosis are stable. There is chronic central airway thickening which appears mildly progressive.  There is new mild blunting of the right costophrenic angle which could reflect a small right pleural effusion.  There is no focal airspace disease or pneumothorax.  Mild degenerative changes in the spine are noted.  IMPRESSION: Increased central airway thickening suspicious for bronchitis. Possible small right pleural effusion.  Stable cardiomegaly.   Original Report Authenticated By: Richardean Sale, M.D.         Subjective: Patient denies fevers, chills, chest discomfort, shortness breath, nausea, vomiting, diarrhea, abdominal pain. She is breathing better. She still has some dyspnea on exertion. No hemoptysis. No dysuria or hematuria. She complains of constipation  Objective: Filed Vitals:   11/06/12 1109 11/06/12 1236 11/06/12 1348 11/06/12 1430  BP: 192/57 127/53  130/55  Pulse: 56 70  70  Temp: 98.1 F (36.7 C)   98 F (36.7 C)  TempSrc: Oral   Oral  Resp: 20  20 19   Height:      Weight:      SpO2:   93% 94%    Intake/Output Summary (Last 24 hours) at 11/06/12 1642 Last data  filed at 11/06/12 1607  Gross per 24 hour  Intake   1463 ml  Output   3050 ml  Net  -1587 ml   Weight change: 0.134 kg (4.7  oz) Exam:   General:  Pt is alert, follows commands appropriately, not in acute distress  HEENT: No icterus, No thrush, No neck mass, /AT  Cardiovascular: RRR, S1/S2, no rubs, no gallops  Respiratory: CTA bilaterally, no wheezing, no crackles, no rhonchi  Abdomen: Soft/+BS, non tender, non distended, no guarding  Extremities: No edema, No lymphangitis, No petechiae, No rashes, no synovitis  Data Reviewed: Basic Metabolic Panel:  Recent Labs Lab 11/03/12 2300 11/05/12 0710 11/06/12 0548  NA 140 140 139  K 3.9 3.3* 3.2*  CL 98 98 99  CO2 30 31 30   GLUCOSE 230* 135* 147*  BUN 26* 27* 30*  CREATININE 2.00* 2.03* 2.02*  CALCIUM 9.6 9.1 9.2  MG  --   --  2.1   Liver Function Tests: No results found for this basename: AST, ALT, ALKPHOS, BILITOT, PROT, ALBUMIN,  in the last 168 hours No results found for this basename: LIPASE, AMYLASE,  in the last 168 hours No results found for this basename: AMMONIA,  in the last 168 hours CBC:  Recent Labs Lab 11/01/12 0857 11/03/12 2300 11/06/12 0548  WBC  --  7.4 6.9  HGB 9.5* 9.2* 8.9*  HCT  --  28.4* 27.9*  MCV  --  95.6 94.3  PLT  --  175 151   Cardiac Enzymes:  Recent Labs Lab 11/03/12 2300  TROPONINI <0.30   BNP: No components found with this basename: POCBNP,  CBG:  Recent Labs Lab 11/05/12 1627 11/05/12 1638 11/05/12 2141 11/06/12 0644 11/06/12 1133  GLUCAP 180* 169* 196* 129* 174*    No results found for this or any previous visit (from the past 240 hour(s)).   Scheduled Meds: . amLODipine  10 mg Oral Daily  . carvedilol  6.25 mg Oral BID WC  . ferrous sulfate  325 mg Oral BID WC  . furosemide  120 mg Intravenous Q6H  . insulin aspart  0-15 Units Subcutaneous TID WC  . insulin glargine  15 Units Subcutaneous QHS  . isosorbide mononitrate  30 mg Oral Daily  . levothyroxine  137 mcg Oral Custom  . levothyroxine  274 mcg Oral Q7 days  . metolazone  5 mg Oral Daily  . nystatin-triamcinolone  1  application Topical BID  . pantoprazole  40 mg Oral BID  . potassium chloride  40 mEq Oral Daily  . silver sulfADIAZINE  1 application Topical Daily  . simvastatin  20 mg Oral q1800  . sodium chloride  3 mL Intravenous Q12H  . warfarin  6 mg Oral Custom   And  . [START ON 11/07/2012] warfarin  9 mg Oral Custom  . Warfarin - Pharmacist Dosing Inpatient   Does not apply q1800   Continuous Infusions:    Thamas Appleyard, DO  [facility] Pager [phone]  If 7PM-7AM, please contact night-coverage [email or web address] Password [email or web address] 11/06/2012, 4:42 PM   LOS: 3 days

## 2012-11-06 NOTE — Progress Notes (Addendum)
Inpatient Diabetes Program Recommendations  AACE/ADA: New Consensus Statement on Inpatient Glycemic Control (2013)  Target Ranges:  Prepandial:   less than 140 mg/dL      Peak postprandial:   less than 180 mg/dL (1-2 hours)      Critically ill patients:  140 - 180 mg/dL   Results for Madeline [name], Madeline [name] (MRN [id number]) as of 11/06/2012 12:03  Ref. Range 11/05/2012 06:39 11/05/2012 10:53 11/05/2012 16:27 11/05/2012 16:38 11/05/2012 21:41 11/06/2012 11:33  Glucose-Capillary Latest Range: 70-99 mg/dL 139 (H) 145 (H) 180 (H) 169 (H) 196 (H) 174 (H)   Inpatient Diabetes Program Recommendations Correction (SSI): Please consider ordering Novolog bedtime correction.  Note: Blood glucose improved with increase of Lantus to 15 units QHS.  Please consider ordering Novolog bedtime correction to further improve inpatient glycemic control.  Will continue to follow.  Thanks, Barnie Alderman, RN, MSN, CCRN Diabetes Coordinator Inpatient Diabetes Program [phone]

## 2012-11-06 NOTE — Plan of Care (Signed)
Problem: Phase I Progression Outcomes Goal: EF % per last Echo/documented,Core Reminder form on chart Outcome: Completed/Met Date Met:  11/06/12 55-60%

## 2012-11-06 NOTE — Progress Notes (Addendum)
Pt and family request cardiology consult Informed MD.   Pt K 3.2 ask MD to increase po K

## 2012-11-06 NOTE — Progress Notes (Signed)
ANTICOAGULATION CONSULT NOTE - Follow Up Consult  Pharmacy Consult for coumadin Indication: atrial fibrillation  No Known Allergies  Patient Measurements: Height: 5\' 2"  (157.5 cm) Weight: 227 lb 4.7 oz (103.1 kg) (Scale B) IBW/kg (Calculated) : 50.1  Vital Signs: Temp: 98 F (36.7 C) (07/01 0438) Temp src: Oral (07/01 0438) BP: 143/43 mmHg (07/01 0438) Pulse Rate: 65 (07/01 0438)  Labs:  Recent Labs  11/03/12 2300 11/05/12 0710 11/06/12 0548  HGB 9.2*  --  8.9*  HCT 28.4*  --  27.9*  PLT 175  --  151  LABPROT 24.2* 27.2* 26.5*  INR 2.26* 2.63* 2.54*  CREATININE 2.00* 2.03* 2.02*  TROPONINI <0.30  --   --     Estimated Creatinine Clearance: 22.9 ml/min (by C-G formula based on Cr of 2.02).   Medications:  Scheduled:  . amLODipine  10 mg Oral Daily  . carvedilol  6.25 mg Oral BID WC  . ferrous sulfate  325 mg Oral BID WC  . furosemide  100 mg Intravenous Q8H  . insulin aspart  0-15 Units Subcutaneous TID WC  . insulin glargine  15 Units Subcutaneous QHS  . isosorbide mononitrate  30 mg Oral Daily  . levothyroxine  137 mcg Oral Custom  . levothyroxine  274 mcg Oral Q7 days  . nystatin-triamcinolone  1 application Topical BID  . pantoprazole  40 mg Oral BID  . potassium chloride  40 mEq Oral Daily  . silver sulfADIAZINE  1 application Topical Daily  . simvastatin  20 mg Oral q1800  . sodium chloride  3 mL Intravenous Q12H  . warfarin  6 mg Oral Custom   And  . [START ON 11/07/2012] warfarin  9 mg Oral Custom  . Warfarin - Pharmacist Dosing Inpatient   Does not apply q1800    Assessment: 79 yo female here with HF on coumadin for history of afib. INR today is 2.54 and patient is on home regimen.  Goal of Therapy:  INR 2-3 Monitor platelets by anticoagulation protocol: Yes   Plan:  -No coumadin changes  -Will follow PT/INR daily for now  Hildred Laser, Pharm D 11/06/2012 8:56 AM

## 2012-11-06 NOTE — Progress Notes (Addendum)
The patient had a 5 beat run of V. Tach followed by a 10 beat run of V. Tach.  The patient was asymptomatic.  The strip was printed and placed in the patient's chart.  Tylene Fantasia was text paged x 2.  New orders for a magnesium level were given.

## 2012-11-07 ENCOUNTER — Encounter (HOSPITAL_COMMUNITY): Payer: Self-pay | Admitting: Internal Medicine

## 2012-11-07 DIAGNOSIS — E039 Hypothyroidism, unspecified: Secondary | ICD-10-CM

## 2012-11-07 DIAGNOSIS — K5900 Constipation, unspecified: Secondary | ICD-10-CM | POA: Diagnosis present

## 2012-11-07 DIAGNOSIS — E876 Hypokalemia: Secondary | ICD-10-CM

## 2012-11-07 DIAGNOSIS — I4891 Unspecified atrial fibrillation: Secondary | ICD-10-CM | POA: Diagnosis present

## 2012-11-07 DIAGNOSIS — D649 Anemia, unspecified: Secondary | ICD-10-CM

## 2012-11-07 HISTORY — DX: Anemia, unspecified: D64.9

## 2012-11-07 LAB — BASIC METABOLIC PANEL
CO2: 35 mEq/L — ABNORMAL HIGH (ref 19–32)
Calcium: 9.8 mg/dL (ref 8.4–10.5)
GFR calc Af Amer: 27 mL/min — ABNORMAL LOW (ref >90)
GFR calc non Af Amer: 24 mL/min — ABNORMAL LOW (ref >90)
Sodium: 143 mEq/L (ref 135–145)

## 2012-11-07 LAB — GLUCOSE, CAPILLARY
Glucose-Capillary: 138 mg/dL — ABNORMAL HIGH (ref 70–99)
Glucose-Capillary: 222 mg/dL — ABNORMAL HIGH (ref 70–99)

## 2012-11-07 LAB — PROTIME-INR
INR: 2.68 — ABNORMAL HIGH (ref 0.00–1.49)
Prothrombin Time: 27.6 seconds — ABNORMAL HIGH (ref 11.6–15.2)

## 2012-11-07 MED ORDER — SORBITOL 70 % SOLN
30.0000 mL | Freq: Every day | Status: DC | PRN
  Filled 2012-11-07: qty 30

## 2012-11-07 NOTE — Consult Note (Signed)
Admit date: 11/03/2012 Referring Physician    Primary Cardiologist  [place] Reason for Consultation  CHF  HPI: 79 year old woman with diastolic dysfunction.  She had been in the hospital about a month ago.  Prior to admission, she noticed increased shortness of breath, pedal edema and 8 pound weight gain.  She was tried on different oral diuretics as an outpatient but this did not have any improvement.  She also received IV Lasix one time without any improvement.  She has known normal left ventricular function.  She has a known renal insufficiency as well.  She has had shortness of breath and pressure in her chest at times.  She is also had significant pedal edema.  She is finally started to diuresis and with increasing doses of Lasix as well as metolazone.  She walked with physical therapy today and did not drop her oxygen saturation.  She is more concerned about her lower extremity edema at this point.  She has not had oxygen for the past 20 minutes or so.     PMH:   Past Medical History  Diagnosis Date  . Hypertension   . Diabetes mellitus   . CHF (congestive heart failure)   . Thyroid disease   . Atrial fibrillation   . Kidney failure   . Anemia 11/07/2012     PSH:   Past Surgical History  Procedure Laterality Date  . Abdominal hysterectomy    . Thyroid surgery      Allergies:  Review of patient's allergies indicates no known allergies. Prior to Admit Meds:   Prescriptions prior to admission  Medication Sig Dispense Refill  . amLODipine (NORVASC) 10 MG tablet Take 10 mg by mouth daily.      . carvedilol (COREG) 6.25 MG tablet Take 6.25 mg by mouth 2 (two) times daily with a meal.      . cholecalciferol (VITAMIN D) 1000 UNITS tablet Take 1,000 Units by mouth daily.      . ferrous sulfate 325 (65 FE) MG tablet Take 325 mg by mouth daily with breakfast.      . furosemide (LASIX) 40 MG tablet Take 120 mg by mouth 2 (two) times daily.      Marland Kitchen glimepiride (AMARYL) 4 MG tablet Take 4 mg  by mouth 2 (two) times daily.      . insulin glargine (LANTUS) 100 UNIT/ML injection Inject 25 Units into the skin at bedtime.       . isosorbide mononitrate (IMDUR) 30 MG 24 hr tablet Take 30 mg by mouth daily.      Marland Kitchen levothyroxine (SYNTHROID, LEVOTHROID) 137 MCG tablet Take 137-274 mcg by mouth daily before breakfast. Takes 2 tablets on Sunday, takes 1 tablet on all remaining days.      Marland Kitchen nystatin-triamcinolone (MYCOLOG II) cream Apply 1 application topically 2 (two) times daily.      . pantoprazole (PROTONIX) 40 MG tablet Take 40 mg by mouth 2 (two) times daily.      . pravastatin (PRAVACHOL) 40 MG tablet Take 40 mg by mouth daily.      . silver sulfADIAZINE (SILVADENE) 1 % cream Apply 1 application topically daily.      . sitaGLIPtin (JANUVIA) 50 MG tablet Take 25 mg by mouth daily.      Marland Kitchen torsemide (DEMADEX) 20 MG tablet Take 40 mg by mouth 2 (two) times daily.      . traMADol (ULTRAM) 50 MG tablet Take 1 tablet (50 mg total) by mouth every 6 (six) hours  as needed for pain.  30 tablet  0  . warfarin (COUMADIN) 3 MG tablet Take 6-9 mg by mouth daily. Takes 2 tablets daily, except takes 3 tablets on Wednesday and Saturday.      . nitroGLYCERIN (NITROSTAT) 0.4 MG SL tablet Place 0.4 mg under the tongue every 5 (five) minutes as needed for chest pain.       Fam HX:   History reviewed. No pertinent family history. Social HX:    History   Social History  . Marital Status: Married    Spouse Name: N/A    Number of Children: N/A  . Years of Education: N/A   Occupational History  . Not on file.   Social History Main Topics  . Smoking status: Never Smoker   . Smokeless tobacco: Never Used  . Alcohol Use: No  . Drug Use: No  . Sexually Active: Not on file   Other Topics Concern  . Not on file   Social History Narrative  . No narrative on file     ROS:  All 11 ROS were addressed and are negative except what is stated in the HPI  Physical Exam: Blood pressure 123/43, pulse 59,  temperature 98.4 F (36.9 C), temperature source Oral, resp. rate 20, height 5\' 2"  (1.575 m), weight 98.4 kg (216 lb 14.9 oz), SpO2 93.00%.  General: Well developed, well nourished, in no acute distress Head:   Normal cephalic and atramatic  Lungs: Bibasilar crackles Heart:  Irregularly irregular, normal rate S1 S2  Abdomen: Obese Msk:  Back normal, Normal strength and tone for age. Extremities:  Bilateral pitting lower extremity edema.   Neuro: Alert and oriented X 3. Psych:  Normal affect, responds appropriately    Labs:   Lab Results  Component Value Date   WBC 6.9 11/06/2012   HGB 8.9* 11/06/2012   HCT 27.9* 11/06/2012   MCV 94.3 11/06/2012   PLT 151 11/06/2012    Recent Labs Lab 11/07/12 0627  NA 143  K 3.4*  CL 96  CO2 35*  BUN 31*  CREATININE 1.86*  CALCIUM 9.8  GLUCOSE 157*   No results found for this basename: PTT   Lab Results  Component Value Date   INR 2.68* 11/07/2012   INR 2.54* 11/06/2012   INR 2.63* 11/05/2012   Lab Results  Component Value Date   TROPONINI <0.30 11/03/2012     No results found for this basename: CHOL   No results found for this basename: HDL   No results found for this basename: LDLCALC   No results found for this basename: TRIG   No results found for this basename: CHOLHDL   No results found for this basename: LDLDIRECT      Radiology:  Dg Chest Port 1 View  11/06/2012   *RADIOLOGY REPORT*  Clinical Data: Cough.  Sputum production.  PORTABLE CHEST - 1 VIEW  Comparison: 11/03/2012.  Findings: Cardiomegaly is present.  Interstitial pulmonary edema is present at the lung bases.  Pulmonary vascular congestion.  There is no focal consolidation identified.  Probable tiny bilateral pleural effusions.  Aortic arch atherosclerosis.  IMPRESSION: Findings most compatible with mild CHF.  No focal consolidation to suggest pneumonia.   Original Report Authenticated By: Dereck Ligas, M.D.    EKG:  Rate-controlled atrial  fibrillation  ASSESSMENT: Diastolic heart failure.  Normal ejection fraction by recent echocardiogram.  PLAN:  Diastolic heart failure: Continue diuretics.  Watch kidney function.  She is starting to diuresis now.  Renal dysfunction: Watch creatinine given high diuretic doses.  Atrial fibrillation: Rate controlled.  Coumadin for stroke prevention.  Lower extremity edema: Hopefully will improve with diuresis.  We'll also have her elevate her legs as much possible to help with edema.    Jettie Booze., MD  11/07/2012  1:26 PM

## 2012-11-07 NOTE — Progress Notes (Signed)
ANTICOAGULATION CONSULT NOTE - Follow Up Consult  Pharmacy Consult for coumadin Indication: atrial fibrillation  No Known Allergies  Patient Measurements: Height: 5\' 2"  (157.5 cm) Weight: 221 lb 5.5 oz (100.4 kg) (SCALE b) IBW/kg (Calculated) : 50.1  Vital Signs: Temp: 98.4 F (36.9 C) (07/02 0456) Temp src: Oral (07/02 0456) BP: 150/59 mmHg (07/02 0456) Pulse Rate: 84 (07/02 0456)  Labs:  Recent Labs  11/05/12 0710 11/06/12 0548 11/07/12 0627  HGB  --  8.9*  --   HCT  --  27.9*  --   PLT  --  151  --   LABPROT 27.2* 26.5* 27.6*  INR 2.63* 2.54* 2.68*  CREATININE 2.03* 2.02* 1.86*    Estimated Creatinine Clearance: 24.5 ml/min (by C-G formula based on Cr of 1.86).   Medications:  Scheduled:  . amLODipine  10 mg Oral Daily  . carvedilol  6.25 mg Oral BID WC  . docusate sodium  100 mg Oral BID  . ferrous sulfate  325 mg Oral BID WC  . furosemide  120 mg Intravenous Q6H  . insulin aspart  0-15 Units Subcutaneous TID WC  . insulin aspart  0-5 Units Subcutaneous QHS  . insulin glargine  15 Units Subcutaneous QHS  . isosorbide mononitrate  30 mg Oral Daily  . levothyroxine  137 mcg Oral Custom  . levothyroxine  274 mcg Oral Q7 days  . metolazone  5 mg Oral Daily  . nystatin-triamcinolone  1 application Topical BID  . pantoprazole  40 mg Oral BID  . potassium chloride  40 mEq Oral Daily  . senna  2 tablet Oral Daily  . silver sulfADIAZINE  1 application Topical Daily  . simvastatin  20 mg Oral q1800  . sodium chloride  3 mL Intravenous Q12H  . warfarin  6 mg Oral Custom   And  . warfarin  9 mg Oral Custom  . Warfarin - Pharmacist Dosing Inpatient   Does not apply q1800    Assessment: 79 yo female here with HF on coumadin for history of afib. INR today is 2.68 and patient is on home regimen.  Goal of Therapy:  INR 2-3 Monitor platelets by anticoagulation protocol: Yes   Plan:  -No coumadin changes  -Will follow PT/INR daily for now  Excell Seltzer, Pharm  D 11/07/2012 8:56 AM

## 2012-11-07 NOTE — Care Management Note (Signed)
    Page 1 of 1   11/07/2012     2:39:27 PM   CARE MANAGEMENT NOTE 11/07/2012  Patient:  JAKELIN, MUDRY   Account Number:  [id number]  Date Initiated:  11/07/2012  Documentation initiated by:  Llana Aliment  Subjective/Objective Assessment:   79yo female admitted with CHF.  Pt. lives at home with family in [place].     Action/Plan:   discharge planning   Anticipated DC Date:  11/09/2012   Anticipated DC Plan:  [place]  CM consult      [facility] Choice  Resumption Of Svcs/PTA Provider   Choice offered to / List presented to:             [place].   Status of service:  In process, will continue to follow Medicare Important Message given?   (If response is "NO", the following Medicare IM given date fields will be blank) Date Medicare IM given:   Date Additional Medicare IM given:    Discharge Disposition:    Per UR Regulation:  Reviewed for med. necessity/level of care/duration of stay  If discussed at [place] of Stay Meetings, dates discussed:    Comments:  11/07/12 1405 In to completed Heart Failure home health screen.  Pt. is currently being seen by [place]. TC to Lelan Pons, with [facility], to give resumption of care orders for [facility] RN, and PT.  NCM will continue to follow for dc needs. Llana Aliment, RN, BSN NCM [phone]

## 2012-11-07 NOTE — Progress Notes (Signed)
Physical Therapy Treatment Patient Details Name: Madeline [name] MRN: [id number] DOB: [birth year] Today's Date: 11/07/2012 Time: [id number] PT Time Calculation (min): 38 min  PT Assessment / Plan / Recommendation  PT Comments   Pt with pleasant demeanor and eager to get up and walk. She is making progress toward PT goals with greatly increased ambulation distance from last treatment session. Patient required cueing with proper usage of RW. She was independent with PLB technique and utilized the technique PRN. Pt daughter was present and willing to provide assistance upon d/c.   Follow Up Recommendations  No PT follow up;Supervision/Assistance - 24 hour     Does the patient have the potential to tolerate intense rehabilitation     Barriers to Discharge        Equipment Recommendations  None recommended by PT    Recommendations for Other Services    Frequency Min 3X/week   Progress towards PT Goals Progress towards PT goals: Progressing toward goals  Plan      Precautions / Restrictions Precautions Precautions: Fall Restrictions Weight Bearing Restrictions: No   Pertinent Vitals/Pain Pt Sp02 dropped to 88% once during ambulation and returned to >90% for the remainder of Tx.    Mobility  Bed Mobility Bed Mobility: Not assessed Transfers Transfers: Sit to Stand;Stand to Sit Sit to Stand: 4: Min guard;With upper extremity assist;From bed Stand to Sit: 5: Supervision;With upper extremity assist;With armrests;To chair/3-in-1 Details for Transfer Assistance: VC for hand placement to push up from chair not pull on RW. Ambulation/Gait Ambulation/Gait Assistance: 4: Min guard Ambulation Distance (Feet): 200 Feet Assistive device: Rolling walker Ambulation/Gait Assistance Details: Cues for walker proximity, hand placement on RW, and posture. Pt on RA with Sp02 dropping to 88% x 1. She required a short standing rest break and Sp02 returned to >90% for remainder of Tx on RA. Gait Pattern:  Step-through pattern;Decreased stride length;Shuffle Gait velocity: decr General Gait Details: pt requires use of RW for safe ambulation Stairs: Yes Stairs Assistance: 4: Min assist Stairs Assistance Details (indicate cue type and reason): cued to stay close to hand rail for support Stair Management Technique: One rail Right (hand held assist on L) Number of Stairs: 3    Exercises       PT Goals (current goals can now be found in the care plan section) Acute Rehab PT Goals Time For Goal Achievement: 11/20/12 Potential to Achieve Goals: Good  Visit Information  Last PT Received On: 11/07/12 Assistance Needed: +1 History of Present Illness: Pt admitted for SOB and LE swelling. Pt found to have afib and with 2 episodes of V-tach.    Subjective Data      Cognition  Cognition Arousal/Alertness: Awake/alert Behavior During Therapy: WFL for tasks assessed/performed Overall Cognitive Status: Within Functional Limits for tasks assessed    Balance     End of Session PT - End of Session Equipment Utilized During Treatment: Gait belt Activity Tolerance: Patient tolerated treatment well Patient left: in chair;with call bell/phone within reach;with family/visitor present Nurse Communication: Mobility status   GP     Madeline [name], Madeline [name] 11/07/2012, 10:23 AM

## 2012-11-07 NOTE — Progress Notes (Signed)
[facility] PROGRESS NOTE  Charlia Gennett [id number] DOB: 1925-10-30 DOA: 11/03/2012 PCP: Abigail Miyamoto, MD  Assessment/Plan: Acute on chronic diastolic CHF  -99991111 echo--EF 0000000, grade 3 diastolic dysfunction  I/O = -3247/past 24 hrs. Weight 100.4kg from 103.1kg. -Continue Imdur and carvedilol  -Increased furosemide to 120mg  IV q 6hrs yesterday and metolazone was added to patient's regimen. - continue fluid restriction, daily weights  -pt is quite diuretic resistant-->added metolazone yesterday -home dose of furosemide is 120mg  po bid -clinically remains fluid overloaded -accurate I/Os  Will consult with cardiology for further evaluation and management. CKD stage IV  -Previous serum creatinine 2.57 on 10/05/2012 and 2.12 on 10/22/2012  -serum creatinine remains stable and slowly improving on IV furosemide. Creatinine today is 1.86 -Renal ultrasound- negative for hydronephrosis  -Patient normally follows [facility] for her CKD  Hypokalemia Secondary to diuresis. Replete. Generalize weakness  -Likely multifactorial  -PT evaluation  Permanent Atrial fibrillation  -Rate controlled  -Continue carvedilol for rate control -Continue warfarin for anti-coagulation Diabetes mellitus type 2  -Hemoglobin A1c 6.0 in 10/04/2012  -Hold glimeperide and Januvia during hospitalization  -increased Lantus to 15units q hs  Yesterday. -At bedtime NovoLog -Continue NovoLog sliding scale Hypothyroidism  -TSH 1.96 on 10/04/2012  -Patient's levothyroxine was increased approximately 3 months ago  Normocytic anemia  -Patient had hemoglobin 10.3 in March 2014  -Check iron studies--iron saturation 11%, B12--940  -RBC folate--2999  -IncreaseD ferrous sulfate to twice a day  Constipation -Colace and Senokot. Sorbitol when necessary. Family Communication: daughter at beside  Disposition Plan: Home when medically stable          Procedures/Studies: Dg Chest 2  View  11/03/2012   *RADIOLOGY REPORT*  Clinical Data: Leg swelling, shortness of breath, history of CHF and atrial fibrillation  CHEST - 2 VIEW  Comparison: 10/22/2012  Findings: Grossly unchanged enlarged cardiac silhouette and mediastinal contours with atherosclerotic calcification within the thoracic aorta.  The pulmonary vasculature is indistinct with cephalization of flow.  Evaluation of retrosternal clear space obscured secondary to overlying soft tissues. Minimal bibasilar opacities, right greater than left, favored to represent atelectasis.  Suspected trace right-sided pleural effusion.  No pneumothorax.  Unchanged bones.  IMPRESSION: Mild pulmonary edema with suspected trace right-sided pleural effusion.   Original Report Authenticated By: Jake Seats, MD   Dg Chest 2 View  10/22/2012   *RADIOLOGY REPORT*  Clinical Data: Shortness of breath.  CHEST - 2 VIEW  Comparison: 10/03/2012 and 08/30/2012 radiographs.  Abdominal CT 01/12/2012.  Findings: Cardiomegaly and aortic atherosclerosis are stable. There is chronic central airway thickening which appears mildly progressive.  There is new mild blunting of the right costophrenic angle which could reflect a small right pleural effusion.  There is no focal airspace disease or pneumothorax.  Mild degenerative changes in the spine are noted.  IMPRESSION: Increased central airway thickening suspicious for bronchitis. Possible small right pleural effusion.  Stable cardiomegaly.   Original Report Authenticated By: Richardean Sale, M.D.         Subjective: Patient states breathing is better. She still has some dyspnea on exertion.   Objective: Filed Vitals:   11/06/12 1430 11/06/12 2206 11/07/12 0030 11/07/12 0456  BP: 130/55 127/52 130/54 150/59  Pulse: 70 70  84  Temp: 98 F (36.7 C) 98.2 F (36.8 C)  98.4 F (36.9 C)  TempSrc: Oral Oral  Oral  Resp: 19 20  20   Height:      Weight:    100.4 kg (  221 lb 5.5 oz)  SpO2: 94% 98%  99%     Intake/Output Summary (Last 24 hours) at 11/07/12 [id number] Last data filed at 11/07/12 [id number]  Gross per 24 hour  Intake   1303 ml  Output   4550 ml  Net  -3247 ml   Weight change: -2.7 kg (-5 lb 15.2 oz) Exam:   General:  Pt is alert, follows commands appropriately, not in acute distress  HEENT: No icterus, No thrush, No neck mass, [place]/AT  Cardiovascular: Irregularly irregular  Respiratory: Bibasilar crackles  Abdomen: Soft/+BS, non tender, non distended, no guarding  Extremities:  No lymphangitis, No petechiae, No rashes, no synovitis. 4+ bilateral lower extremity edema  Data Reviewed: Basic Metabolic Panel:  Recent Labs Lab 11/03/12 2300 11/05/12 0710 11/06/12 0548 11/07/12 0627  NA 140 140 139 143  K 3.9 3.3* 3.2* 3.4*  CL 98 98 99 96  CO2 30 31 30  35*  GLUCOSE 230* 135* 147* 157*  BUN 26* 27* 30* 31*  CREATININE 2.00* 2.03* 2.02* 1.86*  CALCIUM 9.6 9.1 9.2 9.8  MG  --   --  2.1  --    Liver Function Tests: No results found for this basename: AST, ALT, ALKPHOS, BILITOT, PROT, ALBUMIN,  in the last 168 hours No results found for this basename: LIPASE, AMYLASE,  in the last 168 hours No results found for this basename: AMMONIA,  in the last 168 hours CBC:  Recent Labs Lab 11/01/12 0857 11/03/12 2300 11/06/12 0548  WBC  --  7.4 6.9  HGB 9.5* 9.2* 8.9*  HCT  --  28.4* 27.9*  MCV  --  95.6 94.3  PLT  --  175 151   Cardiac Enzymes:  Recent Labs Lab 11/03/12 2300  TROPONINI <0.30   BNP: No components found with this basename: POCBNP,  CBG:  Recent Labs Lab 11/06/12 0644 11/06/12 1133 11/06/12 1625 11/06/12 2110 11/07/12 0638  GLUCAP 129* 174* 159* 193* 148*    No results found for this or any previous visit (from the past 240 hour(s)).   Scheduled Meds: . amLODipine  10 mg Oral Daily  . carvedilol  6.25 mg Oral BID WC  . docusate sodium  100 mg Oral BID  . ferrous sulfate  325 mg Oral BID WC  . furosemide  120 mg Intravenous Q6H  .  insulin aspart  0-15 Units Subcutaneous TID WC  . insulin aspart  0-5 Units Subcutaneous QHS  . insulin glargine  15 Units Subcutaneous QHS  . isosorbide mononitrate  30 mg Oral Daily  . levothyroxine  137 mcg Oral Custom  . levothyroxine  274 mcg Oral Q7 days  . metolazone  5 mg Oral Daily  . nystatin-triamcinolone  1 application Topical BID  . pantoprazole  40 mg Oral BID  . potassium chloride  40 mEq Oral Daily  . senna  2 tablet Oral Daily  . silver sulfADIAZINE  1 application Topical Daily  . simvastatin  20 mg Oral q1800  . sodium chloride  3 mL Intravenous Q12H  . warfarin  6 mg Oral Custom   And  . warfarin  9 mg Oral Custom  . Warfarin - Pharmacist Dosing Inpatient   Does not apply q1800   Continuous Infusions:    THOMPSON,DANIEL, MD  [facility] Pager [phone]  If 7PM-7AM, please contact night-coverage [email or web address] Password [facility] 11/07/2012, 8:32 AM   LOS: 4 days

## 2012-11-07 NOTE — Progress Notes (Signed)
The patient had a slight nosebleed.  A humidifier was added to her oxygen.  The patient is also coughing up yellow-tinged sputum.  [place] on-call Tylene Fantasia notified.

## 2012-11-07 NOTE — Progress Notes (Signed)
Pt O2 drops at time when sleeping d/t 89-91% . Will continue to monitor

## 2012-11-07 NOTE — Progress Notes (Addendum)
Pt stated they were not weight as chart by  Scale. RN took new woeght  Chart 100.4kg at 456 weight at 1100 98.4Kg,   Pt put on RA by PT/OT, stats when ambulating 95% per PT. 96% at rest. Will continue to monitor

## 2012-11-07 NOTE — Progress Notes (Signed)
Informed MD that  Pt get  FERAHEME N EPO on Thursday for [facility] short stay

## 2012-11-08 ENCOUNTER — Encounter (HOSPITAL_COMMUNITY): Payer: PRIVATE HEALTH INSURANCE

## 2012-11-08 LAB — BASIC METABOLIC PANEL
BUN: 37 mg/dL — ABNORMAL HIGH (ref 6–23)
Calcium: 9.7 mg/dL (ref 8.4–10.5)
Creatinine, Ser: 1.87 mg/dL — ABNORMAL HIGH (ref 0.50–1.10)
GFR calc Af Amer: 26 mL/min — ABNORMAL LOW (ref >90)
GFR calc non Af Amer: 23 mL/min — ABNORMAL LOW (ref >90)
GFR calc non Af Amer: 23 mL/min — ABNORMAL LOW (ref >90)
Potassium: 3.8 mEq/L (ref 3.5–5.1)
Sodium: 137 mEq/L (ref 135–145)
Sodium: 137 mEq/L (ref 135–145)

## 2012-11-08 LAB — PRO B NATRIURETIC PEPTIDE: Pro B Natriuretic peptide (BNP): 4745 pg/mL — ABNORMAL HIGH (ref 0–450)

## 2012-11-08 LAB — CBC
MCH: 30.3 pg (ref 26.0–34.0)
Platelets: 163 10*3/uL (ref 150–400)
RBC: 3.14 MIL/uL — ABNORMAL LOW (ref 3.87–5.11)
RDW: 17.8 % — ABNORMAL HIGH (ref 11.5–15.5)
WBC: 7.3 10*3/uL (ref 4.0–10.5)

## 2012-11-08 LAB — GLUCOSE, CAPILLARY
Glucose-Capillary: 172 mg/dL — ABNORMAL HIGH (ref 70–99)
Glucose-Capillary: 183 mg/dL — ABNORMAL HIGH (ref 70–99)

## 2012-11-08 LAB — PROTIME-INR: INR: 2.53 — ABNORMAL HIGH (ref 0.00–1.49)

## 2012-11-08 MED ORDER — DARBEPOETIN ALFA-POLYSORBATE 25 MCG/0.42ML IJ SOLN
25.0000 ug | INTRAMUSCULAR | Status: DC
Start: 2012-11-08 — End: 2012-11-13
  Administered 2012-11-08: 25 ug via SUBCUTANEOUS
  Filled 2012-11-08: qty 0.42

## 2012-11-08 MED ORDER — FERUMOXYTOL INJECTION 510 MG/17 ML
510.0000 mg | INTRAVENOUS | Status: DC
  Administered 2012-11-08: 510 mg via INTRAVENOUS
  Filled 2012-11-08: qty 17

## 2012-11-08 MED ORDER — SODIUM CHLORIDE 0.9 % IV SOLN
INTRAVENOUS | Status: DC
  Administered 2012-11-08: 13:00:00 via INTRAVENOUS

## 2012-11-08 MED ORDER — DEXTROSE 5 % IV SOLN
120.0000 mg | Freq: Three times a day (TID) | INTRAVENOUS | Status: DC
  Administered 2012-11-08 – 2012-11-11 (×9): 120 mg via INTRAVENOUS
  Filled 2012-11-08 (×13): qty 12

## 2012-11-08 MED ORDER — POTASSIUM CHLORIDE CRYS ER 20 MEQ PO TBCR
30.0000 meq | EXTENDED_RELEASE_TABLET | ORAL | Status: AC
  Administered 2012-11-08: 30 meq via ORAL
  Filled 2012-11-08: qty 1

## 2012-11-08 MED ORDER — POTASSIUM CHLORIDE CRYS ER 20 MEQ PO TBCR
EXTENDED_RELEASE_TABLET | ORAL | Status: AC
  Filled 2012-11-08: qty 2

## 2012-11-08 MED ORDER — POTASSIUM CHLORIDE CRYS ER 20 MEQ PO TBCR
30.0000 meq | EXTENDED_RELEASE_TABLET | Freq: Two times a day (BID) | ORAL | Status: DC
  Filled 2012-11-08 (×2): qty 1

## 2012-11-08 MED ORDER — POTASSIUM CHLORIDE CRYS ER 20 MEQ PO TBCR
40.0000 meq | EXTENDED_RELEASE_TABLET | ORAL | Status: AC
  Administered 2012-11-08: 40 meq via ORAL

## 2012-11-08 MED ORDER — POTASSIUM CHLORIDE CRYS ER 20 MEQ PO TBCR
40.0000 meq | EXTENDED_RELEASE_TABLET | Freq: Two times a day (BID) | ORAL | Status: DC
  Administered 2012-11-08 – 2012-11-13 (×11): 40 meq via ORAL
  Filled 2012-11-08 (×13): qty 2

## 2012-11-08 NOTE — Progress Notes (Signed)
Lab called critical value to RN. K+ = 2.7. MD paged by RN. Orders received for 30 mEq of KCl. KCl given by RN. RN rescheduled IV lasix to 08:00 so that day shift MD can re-evaluate. Will continue to monitor.

## 2012-11-08 NOTE — Progress Notes (Signed)
ANTICOAGULATION CONSULT NOTE - Follow Up Consult  Pharmacy Consult for coumadin Indication: atrial fibrillation  No Known Allergies  Patient Measurements: Height: 5\' 2"  (157.5 cm) Weight: 210 lb (95.255 kg) (Scale B) IBW/kg (Calculated) : 50.1  Vital Signs: Temp: 98.2 F (36.8 C) (07/03 1334) Temp src: Oral (07/03 1334) BP: 136/51 mmHg (07/03 1334) Pulse Rate: 67 (07/03 1334)  Labs:  Recent Labs  11/06/12 0548 11/07/12 0627 11/08/12 0438  HGB 8.9*  --  9.5*  HCT 27.9*  --  28.9*  PLT 151  --  163  LABPROT 26.5* 27.6* 26.4*  INR 2.54* 2.68* 2.53*  CREATININE 2.02* 1.86* 1.87*    Estimated Creatinine Clearance: 23.7 ml/min (by C-G formula based on Cr of 1.87).   Medications:  Scheduled:  . sodium chloride   Intravenous Q7 days  . amLODipine  10 mg Oral Daily  . carvedilol  6.25 mg Oral BID WC  . darbepoetin  25 mcg Subcutaneous Q Thu-1800  . docusate sodium  100 mg Oral BID  . ferrous sulfate  325 mg Oral BID WC  . ferumoxytol  510 mg Intravenous Q7 days  . furosemide  120 mg Intravenous Q8H  . insulin aspart  0-15 Units Subcutaneous TID WC  . insulin aspart  0-5 Units Subcutaneous QHS  . insulin glargine  15 Units Subcutaneous QHS  . isosorbide mononitrate  30 mg Oral Daily  . levothyroxine  137 mcg Oral Custom  . levothyroxine  274 mcg Oral Q7 days  . metolazone  5 mg Oral Daily  . nystatin-triamcinolone  1 application Topical BID  . pantoprazole  40 mg Oral BID  . potassium chloride SA      . potassium chloride  40 mEq Oral BID  . senna  2 tablet Oral Daily  . silver sulfADIAZINE  1 application Topical Daily  . simvastatin  20 mg Oral q1800  . sodium chloride  3 mL Intravenous Q12H  . warfarin  6 mg Oral Custom   And  . warfarin  9 mg Oral Custom  . Warfarin - Pharmacist Dosing Inpatient   Does not apply q1800    Assessment: 79 yo female here with HF on coumadin for history of afib. INR today is 2.53 and patient is on home regimen.  Goal of  Therapy:  INR 2-3 Monitor platelets by anticoagulation protocol: Yes   Plan:  -No coumadin changes  -Change PT/INR to MWF  Excell Seltzer, Pharm D 11/08/2012 1:41 PM

## 2012-11-08 NOTE — Progress Notes (Signed)
[facility] PROGRESS NOTE  Madeline [name] [id number] DOB: December 02, 1925 DOA: 11/03/2012 PCP: Abigail Miyamoto, MD  Assessment/Plan: Acute on chronic diastolic CHF  -99991111 echo--EF 0000000, grade 3 diastolic dysfunction  I/O = -4943/past 24 hrs. Weight 95.25 kg from 100.4kg from 103.1kg. -Continue Imdur and carvedilol  -Continue furosemide to 120mg  IV q 6hrs and metolazone. - continue fluid restriction, daily weights  -pt is quite diuretic resistant-->added metolazone 2 days ago. -home dose of furosemide is 120mg  po bid -clinically remains fluid overloaded -accurate I/Os  Cardiology following and appreciate input and recommendations. CKD stage IV  -Previous serum creatinine 2.57 on 10/05/2012 and 2.12 on 10/22/2012  -serum creatinine remains stable and slowly improving on IV furosemide. Creatinine today is 1.87 -Renal ultrasound- negative for hydronephrosis  -Patient normally follows [facility] for her CKD  Hypokalemia Secondary to diuresis. Replete aggressively. Check a magnesium level. Generalize weakness  -Likely multifactorial  -PT evaluation  Permanent Atrial fibrillation  -Rate controlled  -Continue carvedilol for rate control -Continue warfarin for anti-coagulation Diabetes mellitus type 2  -Hemoglobin A1c 6.0 in 10/04/2012  -CBGs 172-222 . -Hold glimeperide and Januvia during hospitalization  -Continue  Lantus to 15units q hs. -At bedtime NovoLog -Continue NovoLog sliding scale Hypothyroidism  -TSH 1.96 on 10/04/2012  -Patient's levothyroxine was increased approximately 3 months ago  Normocytic anemia  -Patient had hemoglobin 10.3 in March 2014  -Check iron studies--iron saturation 11%, B12--940  -RBC folate--2999  -IncreaseD ferrous sulfate to twice a day  Constipation -Colace and Senokot. Sorbitol when necessary. Family Communication: Updated patient no family at beside  Disposition Plan: Home when medically  stable          Procedures/Studies: Dg Chest 2 View  11/03/2012   *RADIOLOGY REPORT*  Clinical Data: Leg swelling, shortness of breath, history of CHF and atrial fibrillation  CHEST - 2 VIEW  Comparison: 10/22/2012  Findings: Grossly unchanged enlarged cardiac silhouette and mediastinal contours with atherosclerotic calcification within the thoracic aorta.  The pulmonary vasculature is indistinct with cephalization of flow.  Evaluation of retrosternal clear space obscured secondary to overlying soft tissues. Minimal bibasilar opacities, right greater than left, favored to represent atelectasis.  Suspected trace right-sided pleural effusion.  No pneumothorax.  Unchanged bones.  IMPRESSION: Mild pulmonary edema with suspected trace right-sided pleural effusion.   Original Report Authenticated By: Jake Seats, MD   Dg Chest 2 View  10/22/2012   *RADIOLOGY REPORT*  Clinical Data: Shortness of breath.  CHEST - 2 VIEW  Comparison: 10/03/2012 and 08/30/2012 radiographs.  Abdominal CT 01/12/2012.  Findings: Cardiomegaly and aortic atherosclerosis are stable. There is chronic central airway thickening which appears mildly progressive.  There is new mild blunting of the right costophrenic angle which could reflect a small right pleural effusion.  There is no focal airspace disease or pneumothorax.  Mild degenerative changes in the spine are noted.  IMPRESSION: Increased central airway thickening suspicious for bronchitis. Possible small right pleural effusion.  Stable cardiomegaly.   Original Report Authenticated By: Richardean Sale, M.D.         Subjective: Patient states breathing is better. She still has some dyspnea on exertion.   Objective: Filed Vitals:   11/07/12 1349 11/07/12 2100 11/08/12 0519 11/08/12 0809  BP: 123/45 138/49 147/66 116/56  Pulse: 64 64 60 60  Temp: 98.3 F (36.8 C) 98.3 F (36.8 C) 98.7 F (37.1 C)   TempSrc: Oral Oral Oral   Resp: 16 17 18    Height:  Weight:   95.255 kg (210 lb)   SpO2: 94% 99% 100%     Intake/Output Summary (Last 24 hours) at 11/08/12 0931 Last data filed at 11/08/12 [id number]  Gross per 24 hour  Intake    782 ml  Output   5226 ml  Net  -4444 ml   Weight change: -2 kg (-4 lb 6.6 oz) Exam:   General:  Pt is alert, follows commands appropriately, not in acute distress  HEENT: No icterus, No thrush, No neck mass, [place]/AT  Cardiovascular: Irregularly irregular  Respiratory: Bibasilar crackles R> L  Abdomen: Soft/+BS, non tender, non distended, no guarding  Extremities:  No lymphangitis, No petechiae, No rashes, no synovitis. 2-3+ bilateral lower extremity edema  Data Reviewed: Basic Metabolic Panel:  Recent Labs Lab 11/03/12 2300 11/05/12 0710 11/06/12 0548 11/07/12 0627 11/08/12 0438  NA 140 140 139 143 137  K 3.9 3.3* 3.2* 3.4* 2.7*  CL 98 98 99 96 89*  CO2 30 31 30  35* 38*  GLUCOSE 230* 135* 147* 157* 169*  BUN 26* 27* 30* 31* 35*  CREATININE 2.00* 2.03* 2.02* 1.86* 1.87*  CALCIUM 9.6 9.1 9.2 9.8 9.7  MG  --   --  2.1  --   --    Liver Function Tests: No results found for this basename: AST, ALT, ALKPHOS, BILITOT, PROT, ALBUMIN,  in the last 168 hours No results found for this basename: LIPASE, AMYLASE,  in the last 168 hours No results found for this basename: AMMONIA,  in the last 168 hours CBC:  Recent Labs Lab 11/03/12 2300 11/06/12 0548 11/08/12 0438  WBC 7.4 6.9 7.3  HGB 9.2* 8.9* 9.5*  HCT 28.4* 27.9* 28.9*  MCV 95.6 94.3 92.0  PLT 175 151 163   Cardiac Enzymes:  Recent Labs Lab 11/03/12 2300  TROPONINI <0.30   BNP: No components found with this basename: POCBNP,  CBG:  Recent Labs Lab 11/07/12 0638 11/07/12 1136 11/07/12 1615 11/07/12 2134 11/08/12 0628  GLUCAP 148* 172* 138* 222* 172*    No results found for this or any previous visit (from the past 240 hour(s)).   Scheduled Meds: . amLODipine  10 mg Oral Daily  . carvedilol  6.25 mg Oral BID WC  .  docusate sodium  100 mg Oral BID  . ferrous sulfate  325 mg Oral BID WC  . furosemide  120 mg Intravenous Q8H  . insulin aspart  0-15 Units Subcutaneous TID WC  . insulin aspart  0-5 Units Subcutaneous QHS  . insulin glargine  15 Units Subcutaneous QHS  . isosorbide mononitrate  30 mg Oral Daily  . levothyroxine  137 mcg Oral Custom  . levothyroxine  274 mcg Oral Q7 days  . metolazone  5 mg Oral Daily  . nystatin-triamcinolone  1 application Topical BID  . pantoprazole  40 mg Oral BID  . potassium chloride SA      . potassium chloride  40 mEq Oral BID  . senna  2 tablet Oral Daily  . silver sulfADIAZINE  1 application Topical Daily  . simvastatin  20 mg Oral q1800  . sodium chloride  3 mL Intravenous Q12H  . warfarin  6 mg Oral Custom   And  . warfarin  9 mg Oral Custom  . Warfarin - Pharmacist Dosing Inpatient   Does not apply q1800   Continuous Infusions:    THOMPSON,DANIEL, MD  [facility] Pager [phone]  If 7PM-7AM, please contact night-coverage [email or web address] Password [facility] 11/08/2012, 9:31 AM  LOS: 5 days

## 2012-11-08 NOTE — Progress Notes (Signed)
Physical Therapy Treatment Patient Details Name: Madeline [name] MRN: [id number] DOB: Apr 05, 1926 Today's Date: 11/08/2012 Time: [id number] PT Time Calculation (min): 26 min  PT Assessment / Plan / Recommendation  PT Comments   Pt continues to make progress and willing to work.  Pt in restroom when entering waiting for assistance.  Pt able to complete pericare independently.  Will continue to follow.   Follow Up Recommendations  No PT follow up;Supervision/Assistance - 24 hour     Does the patient have the potential to tolerate intense rehabilitation     Barriers to Discharge        Equipment Recommendations  None recommended by PT    Recommendations for Other Services    Frequency Min 3X/week   Progress towards PT Goals Progress towards PT goals: Progressing toward goals  Plan Current plan remains appropriate    Precautions / Restrictions Precautions Precautions: Fall   Pertinent Vitals/Pain No c/o pain; see ambulation for vitals during gait    Mobility  Bed Mobility Bed Mobility: Not assessed Transfers Transfers: Sit to Stand;Stand to Sit Sit to Stand: 4: Min guard;With upper extremity assist;From bed Stand to Sit: 5: Supervision;With upper extremity assist;With armrests;To chair/3-in-1 Details for Transfer Assistance: VC for hand placement to push up from chair not pull on RW. Ambulation/Gait Ambulation/Gait Assistance: 4: Min guard Ambulation Distance (Feet): 150 Feet Assistive device: Rolling walker Ambulation/Gait Assistance Details: Minguard for safety with cues for upright posture and body position within RW.  + SOB however Sa02 maintianed > 92% on RA Gait Pattern: Step-through pattern;Decreased stride length;Shuffle Gait velocity: decr General Gait Details: pt requires use of RW for safe ambulation Stairs: No    Exercises     PT Diagnosis:    PT Problem List:   PT Treatment Interventions:     PT Goals (current goals can now be found in the care plan  section) Acute Rehab PT Goals Patient Stated Goal: I want to go back to doing things for myself PT Goal Formulation: With patient Time For Goal Achievement: 11/20/12 Potential to Achieve Goals: Good  Visit Information  Last PT Received On: 11/08/12 Assistance Needed: +1 History of Present Illness: Pt admitted for SOB and LE swelling. Pt found to have afib and with 2 episodes of V-tach.    Subjective Data  Subjective: "I'm doing ok today." Patient Stated Goal: I want to go back to doing things for myself   Cognition  Cognition Arousal/Alertness: Awake/alert Behavior During Therapy: WFL for tasks assessed/performed Overall Cognitive Status: Within Functional Limits for tasks assessed    Balance     End of Session PT - End of Session Equipment Utilized During Treatment: Gait belt Activity Tolerance: Patient tolerated treatment well Patient left: in chair;with call bell/phone within reach;with family/visitor present Nurse Communication: Mobility status   GP     Geanine Vandekamp 11/08/2012, 11:10 AM Antoine Poche, [place] DPT [phone]

## 2012-11-08 NOTE — Progress Notes (Signed)
Nioka Thorington, PTA [phone] 11/08/2012  

## 2012-11-08 NOTE — Progress Notes (Signed)
OT Cancellation Note  Patient Details Name: Madeline [name] MRN: [id number] DOB: 08-21-1925   Cancelled Treatment:    Reason Eval/Treat Not Completed: Fatigue/lethargy limiting ability to participate. Pt was up all night having to go to the bathroom and is just now resting.  Almon Register [id number] 11/08/2012, 3:14 PM

## 2012-11-08 NOTE — Progress Notes (Signed)
Assesment completed, pt on 2L O2 [place], on continues O2 monitor with saturation of 97%. Pt c/o of BLE pain 5/10, Tramadol given as needed. Pt resting comfortable on bed no distress noticed.

## 2012-11-08 NOTE — Progress Notes (Signed)
Subjective:  Feels a little better. Still edematous. Better output.   Objective:  Vital Signs in the last 24 hours: Temp:  [98.3 F (36.8 C)-98.7 F (37.1 C)] 98.7 F (37.1 C) (07/03 0519) Pulse Rate:  [59-64] 60 (07/03 0809) Resp:  [16-20] 18 (07/03 0519) BP: (116-147)/(43-66) 116/56 mmHg (07/03 0809) SpO2:  [93 %-100 %] 100 % (07/03 0519) Weight:  [95.255 kg (210 lb)-98.4 kg (216 lb 14.9 oz)] 95.255 kg (210 lb) (07/03 0519)  Intake/Output from previous day: 07/02 0701 - 07/03 0700 In: 782 [P.O.:720; IV Piggyback:62] Out: 5725 [Urine:5725]   Physical Exam: General: Well developed, well nourished, in no acute distress. Head:  Normocephalic and atraumatic. Mid neck JVD Lungs: Mild crackles at bases Heart: Loletha Grayer irreg soft systolic murmur LLSB, rubs or gallops.  Abdomen: soft, non-tender, positive bowel sounds. Edematous Extremities: No clubbing or cyanosis. 3+ LE edema bilat Neurologic: Alert and oriented x 3.    Lab Results:  Recent Labs  11/06/12 0548 11/08/12 0438  WBC 6.9 7.3  HGB 8.9* 9.5*  PLT 151 163    Recent Labs  11/07/12 0627 11/08/12 0438  NA 143 137  K 3.4* 2.7*  CL 96 89*  CO2 35* 38*  GLUCOSE 157* 169*  BUN 31* 35*  CREATININE 1.86* 1.87*  Dg Chest Port 1 View  11/06/2012   *RADIOLOGY REPORT*  Clinical Data: Cough.  Sputum production.  PORTABLE CHEST - 1 VIEW  Comparison: 11/03/2012.  Findings: Cardiomegaly is present.  Interstitial pulmonary edema is present at the lung bases.  Pulmonary vascular congestion.  There is no focal consolidation identified.  Probable tiny bilateral pleural effusions.  Aortic arch atherosclerosis.  IMPRESSION: Findings most compatible with mild CHF.  No focal consolidation to suggest pneumonia.   Original Report Authenticated By: Dereck Ligas, M.D.     Telemetry: AFIB 50's Personally viewed.    Assessment/Plan:  Principal Problem:   Acute on chronic diastolic CHF (congestive heart failure) Active Problems:  Physical deconditioning   Unspecified hypothyroidism   Acute diastolic CHF (congestive heart failure)   CKD (chronic kidney disease) stage 4, GFR 15-29 ml/min   DM2 (diabetes mellitus, type 2)   Systolic and diastolic CHF, acute on chronic   Hypokalemia   Atrial fibrillation   Anemia   Unspecified constipation   Diastolic HF  - continue with IV lasix and metolazone  - aggressive KCL repletion  - Will need high dose torsemide when DC'd with likely occasional metoloazone  - Note hypokalemia (aggressive diuresis with IV lasix and metol).   - I suspect she still has a few days to go.  AFIB  - well rate cont.   - warfarin  With two recent hospitalizations, let us make sure that she is adequately diuresed prior to DC.   SKAINS, [place] 11/08/2012, 8:52 AM

## 2012-11-09 LAB — CBC
HCT: 28.7 % — ABNORMAL LOW (ref 36.0–46.0)
MCH: 31.3 pg (ref 26.0–34.0)
MCHC: 34.5 g/dL (ref 30.0–36.0)
RDW: 17.6 % — ABNORMAL HIGH (ref 11.5–15.5)

## 2012-11-09 LAB — BASIC METABOLIC PANEL
BUN: 41 mg/dL — ABNORMAL HIGH (ref 6–23)
Chloride: 87 mEq/L — ABNORMAL LOW (ref 96–112)
Creatinine, Ser: 2.05 mg/dL — ABNORMAL HIGH (ref 0.50–1.10)
GFR calc Af Amer: 24 mL/min — ABNORMAL LOW (ref >90)

## 2012-11-09 LAB — GLUCOSE, CAPILLARY
Glucose-Capillary: 178 mg/dL — ABNORMAL HIGH (ref 70–99)
Glucose-Capillary: 229 mg/dL — ABNORMAL HIGH (ref 70–99)

## 2012-11-09 LAB — PROTIME-INR: INR: 2.48 — ABNORMAL HIGH (ref 0.00–1.49)

## 2012-11-09 NOTE — Progress Notes (Signed)
ANTICOAGULATION CONSULT NOTE - Follow Up Consult  Pharmacy Consult for coumadin Indication: atrial fibrillation  No Known Allergies  Patient Measurements: Height: 5\' 2"  (157.5 cm) Weight: 205 lb 14.4 oz (93.396 kg) (Scale B) IBW/kg (Calculated) : 50.1   Vital Signs: Temp: 98 F (36.7 C) (07/04 0516) Temp src: Oral (07/04 0516) BP: 132/41 mmHg (07/04 0942) Pulse Rate: 62 (07/04 0942)  Labs:  Recent Labs  11/07/12 0627 11/08/12 0438 11/08/12 1443 11/09/12 0500  HGB  --  9.5*  --  9.9*  HCT  --  28.9*  --  28.7*  PLT  --  163  --  219  LABPROT 27.6* 26.4*  --  26.0*  INR 2.68* 2.53*  --  2.48*  CREATININE 1.86* 1.87* 1.93* 2.05*    Estimated Creatinine Clearance: 21.3 ml/min (by C-G formula based on Cr of 2.05).  Assessment: Patient is an 79 y.o F on coumadin for hx Afib.  INR remains therapeutic on home coumadin regimen.  No bleeding documented.   Goal of Therapy:  INR 2-3    Plan:  1) continue current coumadin regimen 2) will f/u with next scheduled PT/INR on Monday 7/7 and adjust dose as needed  Hewitt Garner P 11/09/2012,10:42 AM

## 2012-11-09 NOTE — Progress Notes (Signed)
Assessment completed, pt denies any pain or discomfort, pt resting on bed comfortable with 2L O2 [place], saturation 99%, daughter and husband at the bedside. Dressing changed by Lonzo Cloud, RN  at the change of shift. We'll continue with POC.

## 2012-11-09 NOTE — Progress Notes (Signed)
Subjective:  Edema is better and not as SOB.  Feeling better.  Diuresing well.  Objective:  Vital Signs in the last 24 hours: BP 132/41  Pulse 62  Temp(Src) 98 F (36.7 C) (Oral)  Resp 18  Ht 5\' 2"  (1.575 m)  Wt 93.396 kg (205 lb 14.4 oz)  BMI 37.65 kg/m2  SpO2 92%  Physical Exam: Pleasant obese WF in NAD Lungs:  Clear Cardiac:  Regular rhythm, normal S1 and S2, no S3 Abdomen:  Soft, nontender, no masses Extremities: 1-2+ edema present right greater than left  Intake/Output from previous day: 07/03 0701 - 07/04 0700 In: 941 [P.O.:938; I.V.:3] Out: 3152 [Urine:3150; Stool:2] Weight Filed Weights   11/07/12 1100 11/08/12 0519 11/09/12 0516  Weight: 98.4 kg (216 lb 14.9 oz) 95.255 kg (210 lb) 93.396 kg (205 lb 14.4 oz)    Lab Results: Basic Metabolic Panel:  Recent Labs  11/08/12 1443 11/09/12 0500  NA 137 137  K 3.8 3.8  CL 90* 87*  CO2 36* 38*  GLUCOSE 194* 170*  BUN 37* 41*  CREATININE 1.93* 2.05*    CBC:  Recent Labs  11/08/12 0438 11/09/12 0500  WBC 7.3 9.4  HGB 9.5* 9.9*  HCT 28.9* 28.7*  MCV 92.0 90.8  PLT 163 219    BNP    Component Value Date/Time   PROBNP 4745.0* 11/08/2012 0438    PROTIME: Lab Results  Component Value Date   INR 2.48* 11/09/2012   INR 2.53* 11/08/2012   INR 2.68* 11/07/2012    Telemetry: Atrial fibrillation with controlled response Assessment/Plan:  1. Acute on chronic diastolic CHF improved 2. Chronic a fib 3. CKD stage 4  Rec:  Continue diuresis today.  If stable next day or so, change to po.     Kerry Hough  MD [facility]  11/09/2012, 11:21 AM

## 2012-11-09 NOTE — Progress Notes (Signed)
[facility] PROGRESS NOTE  Tammatha Primavera [id number] DOB: 06-02-1925 DOA: 11/03/2012 PCP: Abigail Miyamoto, MD  Assessment/Plan: Acute on chronic diastolic CHF  -99991111 echo--EF 0000000, grade 3 diastolic dysfunction  I/O = -2211/past 24 hrs. Weight 93.39 kg from 100.4kg from 103.1kg. -Continue Imdur and carvedilol  -Continue furosemide to 120mg  IV q 6hrs and metolazone. - continue fluid restriction, daily weights  -pt is quite diuretic resistant-->added metolazone 3 days ago. -home dose of furosemide is 120mg  po bid -clinically remains fluid overloaded -accurate I/Os  Cardiology following and appreciate input and recommendations. CKD stage IV  -Previous serum creatinine 2.57 on 10/05/2012 and 2.12 on 10/22/2012  -serum creatinine remains stable and slowly improving on IV furosemide. Creatinine today is 2.05. -Renal ultrasound- negative for hydronephrosis  -Patient normally follows [facility] for her CKD  Hypokalemia Secondary to diuresis. Replete aggressively. Check a magnesium level. Generalize weakness  -Likely multifactorial  -PT evaluation  Permanent Atrial fibrillation  -Rate controlled  -Continue carvedilol for rate control -Continue warfarin for anti-coagulation Diabetes mellitus type 2  -Hemoglobin A1c 6.0 in 10/04/2012  -CBGs 172-222 . -Hold glimeperide and Januvia during hospitalization  -Increase  Lantus to 20units q hs. -At bedtime NovoLog -Continue NovoLog sliding scale Hypothyroidism  -TSH 1.96 on 10/04/2012  -Patient's levothyroxine was increased approximately 3 months ago. F/u as outpatient. Normocytic anemia  -Patient had hemoglobin 10.3 in March 2014  -Check iron studies--iron saturation 11%, B12--940  -RBC folate--2999  -IncreaseD ferrous sulfate to twice a day  Constipation -Colace and Senokot. Sorbitol when necessary. Family Communication: Updated patient and family at beside  Disposition Plan: Home when medically  stable          Procedures/Studies: Dg Chest 2 View  11/03/2012   *RADIOLOGY REPORT*  Clinical Data: Leg swelling, shortness of breath, history of CHF and atrial fibrillation  CHEST - 2 VIEW  Comparison: 10/22/2012  Findings: Grossly unchanged enlarged cardiac silhouette and mediastinal contours with atherosclerotic calcification within the thoracic aorta.  The pulmonary vasculature is indistinct with cephalization of flow.  Evaluation of retrosternal clear space obscured secondary to overlying soft tissues. Minimal bibasilar opacities, right greater than left, favored to represent atelectasis.  Suspected trace right-sided pleural effusion.  No pneumothorax.  Unchanged bones.  IMPRESSION: Mild pulmonary edema with suspected trace right-sided pleural effusion.   Original Report Authenticated By: Jake Seats, MD   Dg Chest 2 View  10/22/2012   *RADIOLOGY REPORT*  Clinical Data: Shortness of breath.  CHEST - 2 VIEW  Comparison: 10/03/2012 and 08/30/2012 radiographs.  Abdominal CT 01/12/2012.  Findings: Cardiomegaly and aortic atherosclerosis are stable. There is chronic central airway thickening which appears mildly progressive.  There is new mild blunting of the right costophrenic angle which could reflect a small right pleural effusion.  There is no focal airspace disease or pneumothorax.  Mild degenerative changes in the spine are noted.  IMPRESSION: Increased central airway thickening suspicious for bronchitis. Possible small right pleural effusion.  Stable cardiomegaly.   Original Report Authenticated By: Richardean Sale, M.D.         Subjective: Patient states breathing is better.    Objective: Filed Vitals:   11/08/12 1719 11/08/12 2127 11/09/12 0516 11/09/12 0942  BP: 134/48 130/41 140/51 132/41  Pulse: 64 58 69 62  Temp:  97.8 F (36.6 C) 98 F (36.7 C)   TempSrc:  Oral Oral   Resp:  18 18   Height:      Weight:   93.396 kg (205  lb 14.4 oz)   SpO2:  93% 92%      Intake/Output Summary (Last 24 hours) at 11/09/12 1248 Last data filed at 11/09/12 1111  Gross per 24 hour  Intake    941 ml  Output   3751 ml  Net  -2810 ml   Weight change: -5.004 kg (-11 lb 0.5 oz) Exam:   General:  Pt is alert, follows commands appropriately, not in acute distress  HEENT: No icterus, No thrush, No neck mass, [place]/AT  Cardiovascular: Irregularly irregular  Respiratory: Bibasilar crackles R> L  Abdomen: Soft/+BS, non tender, non distended, no guarding  Extremities:  No lymphangitis, No petechiae, No rashes, no synovitis. 2-3+ bilateral lower extremity edema  Data Reviewed: Basic Metabolic Panel:  Recent Labs Lab 11/05/12 0710 11/06/12 0548 11/07/12 0627 11/08/12 0438 11/08/12 1443 11/09/12 0500  NA 140 139 143 137 137 137  K 3.3* 3.2* 3.4* 2.7* 3.8 3.8  CL 98 99 96 89* 90* 87*  CO2 31 30 35* 38* 36* 38*  GLUCOSE 135* 147* 157* 169* 194* 170*  BUN 27* 30* 31* 35* 37* 41*  CREATININE 2.03* 2.02* 1.86* 1.87* 1.93* 2.05*  CALCIUM 9.1 9.2 9.8 9.7 10.1 10.1  MG  --  2.1  --  1.9  --   --    Liver Function Tests: No results found for this basename: AST, ALT, ALKPHOS, BILITOT, PROT, ALBUMIN,  in the last 168 hours No results found for this basename: LIPASE, AMYLASE,  in the last 168 hours No results found for this basename: AMMONIA,  in the last 168 hours CBC:  Recent Labs Lab 11/03/12 2300 11/06/12 0548 11/08/12 0438 11/09/12 0500  WBC 7.4 6.9 7.3 9.4  HGB 9.2* 8.9* 9.5* 9.9*  HCT 28.4* 27.9* 28.9* 28.7*  MCV 95.6 94.3 92.0 90.8  PLT 175 151 163 219   Cardiac Enzymes:  Recent Labs Lab 11/03/12 2300  TROPONINI <0.30   BNP: No components found with this basename: POCBNP,  CBG:  Recent Labs Lab 11/08/12 0628 11/08/12 1114 11/08/12 1616 11/08/12 2117 11/09/12 0521  GLUCAP 172* 229* 183* 226* 178*    No results found for this or any previous visit (from the past 240 hour(s)).   Scheduled Meds: . sodium chloride    Intravenous Q7 days  . amLODipine  10 mg Oral Daily  . carvedilol  6.25 mg Oral BID WC  . darbepoetin  25 mcg Subcutaneous Q Thu-1800  . docusate sodium  100 mg Oral BID  . ferrous sulfate  325 mg Oral BID WC  . ferumoxytol  510 mg Intravenous Q7 days  . furosemide  120 mg Intravenous Q8H  . insulin aspart  0-15 Units Subcutaneous TID WC  . insulin aspart  0-5 Units Subcutaneous QHS  . insulin glargine  15 Units Subcutaneous QHS  . isosorbide mononitrate  30 mg Oral Daily  . levothyroxine  137 mcg Oral Custom  . levothyroxine  274 mcg Oral Q7 days  . metolazone  5 mg Oral Daily  . nystatin-triamcinolone  1 application Topical BID  . pantoprazole  40 mg Oral BID  . potassium chloride  40 mEq Oral BID  . senna  2 tablet Oral Daily  . silver sulfADIAZINE  1 application Topical Daily  . simvastatin  20 mg Oral q1800  . sodium chloride  3 mL Intravenous Q12H  . warfarin  6 mg Oral Custom   And  . warfarin  9 mg Oral Custom  . Warfarin - Pharmacist Dosing  Inpatient   Does not apply q1800   Continuous Infusions:    Abilene Mcphee, MD  [facility] Pager [phone]  If 7PM-7AM, please contact night-coverage [email or web address] Password [email or web address] 11/09/2012, 12:48 PM   LOS: 6 days

## 2012-11-10 DIAGNOSIS — E873 Alkalosis: Secondary | ICD-10-CM | POA: Diagnosis not present

## 2012-11-10 LAB — BASIC METABOLIC PANEL
BUN: 47 mg/dL — ABNORMAL HIGH (ref 6–23)
CO2: 40 mEq/L (ref 19–32)
Calcium: 10.1 mg/dL (ref 8.4–10.5)
Calcium: 10.1 mg/dL (ref 8.4–10.5)
Chloride: 84 mEq/L — ABNORMAL LOW (ref 96–112)
Creatinine, Ser: 2.08 mg/dL — ABNORMAL HIGH (ref 0.50–1.10)
GFR calc Af Amer: 24 mL/min — ABNORMAL LOW (ref >90)
GFR calc Af Amer: 26 mL/min — ABNORMAL LOW (ref >90)
GFR calc non Af Amer: 21 mL/min — ABNORMAL LOW (ref >90)
Potassium: 3.1 mEq/L — ABNORMAL LOW (ref 3.5–5.1)
Sodium: 136 mEq/L (ref 135–145)

## 2012-11-10 LAB — CBC
MCH: 29.7 pg (ref 26.0–34.0)
MCHC: 32.6 g/dL (ref 30.0–36.0)
Platelets: 178 10*3/uL (ref 150–400)
RDW: 17.2 % — ABNORMAL HIGH (ref 11.5–15.5)

## 2012-11-10 LAB — GLUCOSE, CAPILLARY: Glucose-Capillary: 202 mg/dL — ABNORMAL HIGH (ref 70–99)

## 2012-11-10 MED ORDER — ACETAZOLAMIDE 250 MG PO TABS
250.0000 mg | ORAL_TABLET | Freq: Two times a day (BID) | ORAL | Status: AC
  Administered 2012-11-10 – 2012-11-11 (×4): 250 mg via ORAL
  Filled 2012-11-10 (×4): qty 1

## 2012-11-10 MED ORDER — POTASSIUM CHLORIDE CRYS ER 20 MEQ PO TBCR
40.0000 meq | EXTENDED_RELEASE_TABLET | ORAL | Status: AC
  Administered 2012-11-10 (×2): 40 meq via ORAL
  Filled 2012-11-10: qty 2

## 2012-11-10 MED ORDER — INSULIN GLARGINE 100 UNIT/ML ~~LOC~~ SOLN
20.0000 [IU] | Freq: Every day | SUBCUTANEOUS | Status: DC
  Administered 2012-11-10 – 2012-11-11 (×2): 20 [IU] via SUBCUTANEOUS
  Filled 2012-11-10 (×3): qty 0.2

## 2012-11-10 NOTE — Progress Notes (Signed)
[facility] PROGRESS NOTE  Madeline [name] [id number] DOB: 12-25-1925 DOA: 11/03/2012 PCP: Abigail Miyamoto, MD  Assessment/Plan: Acute on chronic diastolic CHF  -99991111 echo--EF 0000000, grade 3 diastolic dysfunction  I/O = -2211/past 24 hrs. Weight 93.39 kg from 100.4kg from 103.1kg. -Continue Imdur and carvedilol  -Continue furosemide to 120mg  IV q 6hrs and metolazone. - continue fluid restriction, daily weights  -pt is quite diuretic resistant-->added metolazone 4 days ago. -home dose of furosemide is 120mg  po bid -clinically improving -accurate I/Os  Cardiology following and appreciate input and recommendations. Contraction alkalosis Secondary to diuretics. Diamox added to regimen per cardiology. Follow. CKD stage IV  -Previous serum creatinine 2.57 on 10/05/2012 and 2.12 on 10/22/2012  -serum creatinine remains stable and slowly improving on IV furosemide. Creatinine today is 2.08. -Renal ultrasound- negative for hydronephrosis  -Patient normally follows [facility] for her CKD  Hypokalemia Secondary to diuresis. Replete aggressively. Generalize weakness  -Likely multifactorial  -PT evaluation  Permanent Atrial fibrillation  -Rate controlled  -Continue carvedilol for rate control -Continue warfarin for anti-coagulation Diabetes mellitus type 2  -Hemoglobin A1c 6.0 in 10/04/2012  -CBGs 170-235 . -Hold glimeperide and Januvia during hospitalization  -Increase  Lantus to 24units q hs. -At bedtime NovoLog -Continue NovoLog sliding scale Hypothyroidism  -TSH 1.96 on 10/04/2012  -Patient's levothyroxine was increased approximately 3 months ago. F/u as outpatient. Normocytic anemia  -Patient had hemoglobin 10.3 in March 2014  -Check iron studies--iron saturation 11%, B12--940  -RBC folate--2999  -IncreaseD ferrous sulfate to twice a day  Constipation -Colace and Senokot. Sorbitol when necessary. Family Communication: Updated patient, no family at  beside  Disposition Plan: Home when medically stable          Procedures/Studies: Dg Chest 2 View  11/03/2012   *RADIOLOGY REPORT*  Clinical Data: Leg swelling, shortness of breath, history of CHF and atrial fibrillation  CHEST - 2 VIEW  Comparison: 10/22/2012  Findings: Grossly unchanged enlarged cardiac silhouette and mediastinal contours with atherosclerotic calcification within the thoracic aorta.  The pulmonary vasculature is indistinct with cephalization of flow.  Evaluation of retrosternal clear space obscured secondary to overlying soft tissues. Minimal bibasilar opacities, right greater than left, favored to represent atelectasis.  Suspected trace right-sided pleural effusion.  No pneumothorax.  Unchanged bones.  IMPRESSION: Mild pulmonary edema with suspected trace right-sided pleural effusion.   Original Report Authenticated By: Jake Seats, MD   Dg Chest 2 View  10/22/2012   *RADIOLOGY REPORT*  Clinical Data: Shortness of breath.  CHEST - 2 VIEW  Comparison: 10/03/2012 and 08/30/2012 radiographs.  Abdominal CT 01/12/2012.  Findings: Cardiomegaly and aortic atherosclerosis are stable. There is chronic central airway thickening which appears mildly progressive.  There is new mild blunting of the right costophrenic angle which could reflect a small right pleural effusion.  There is no focal airspace disease or pneumothorax.  Mild degenerative changes in the spine are noted.  IMPRESSION: Increased central airway thickening suspicious for bronchitis. Possible small right pleural effusion.  Stable cardiomegaly.   Original Report Authenticated By: Richardean Sale, M.D.         Subjective: Patient states breathing is better.  No complaints.   Objective: Filed Vitals:   11/09/12 2107 11/10/12 0412 11/10/12 0528 11/10/12 0953  BP: 138/45  129/51 113/80  Pulse: 65  67 73  Temp: 98.6 F (37 C)  98.4 F (36.9 C)   TempSrc: Oral  Oral   Resp: 18  19 18   Height:  Weight:   91.309 kg (201 lb 4.8 oz)    SpO2: 97%  95% 98%    Intake/Output Summary (Last 24 hours) at 11/10/12 1007 Last data filed at 11/10/12 0900  Gross per 24 hour  Intake   1492 ml  Output   2850 ml  Net  -1358 ml   Weight change: -2.087 kg (-4 lb 9.6 oz) Exam:   General:  Pt is alert, follows commands appropriately, not in acute distress  HEENT: No icterus, No thrush, No neck mass, [place]/AT  Cardiovascular: Irregularly irregular  Respiratory: Bibasilar crackles R> L  Abdomen: Soft/+BS, non tender, non distended, no guarding  Extremities:  No lymphangitis, No petechiae, No rashes, no synovitis. 1+ bilateral lower extremity edema  Data Reviewed: Basic Metabolic Panel:  Recent Labs Lab 11/05/12 0710 11/06/12 0548 11/07/12 0627 11/08/12 0438 11/08/12 1443 11/09/12 0500 11/10/12 0550  NA 140 139 143 137 137 137 139  K 3.3* 3.2* 3.4* 2.7* 3.8 3.8 3.1*  CL 98 99 96 89* 90* 87* 86*  CO2 31 30 35* 38* 36* 38* 40*  GLUCOSE 135* 147* 157* 169* 194* 170* 172*  BUN 27* 30* 31* 35* 37* 41* 47*  CREATININE 2.03* 2.02* 1.86* 1.87* 1.93* 2.05* 2.08*  CALCIUM 9.1 9.2 9.8 9.7 10.1 10.1 10.1  MG  --  2.1  --  1.9  --   --   --    Liver Function Tests: No results found for this basename: AST, ALT, ALKPHOS, BILITOT, PROT, ALBUMIN,  in the last 168 hours No results found for this basename: LIPASE, AMYLASE,  in the last 168 hours No results found for this basename: AMMONIA,  in the last 168 hours CBC:  Recent Labs Lab 11/03/12 2300 11/06/12 0548 11/08/12 0438 11/09/12 0500 11/10/12 0550  WBC 7.4 6.9 7.3 9.4 8.4  HGB 9.2* 8.9* 9.5* 9.9* 10.0*  HCT 28.4* 27.9* 28.9* 28.7* 30.7*  MCV 95.6 94.3 92.0 90.8 91.1  PLT 175 151 163 219 178   Cardiac Enzymes:  Recent Labs Lab 11/03/12 2300  TROPONINI <0.30   BNP: No components found with this basename: POCBNP,  CBG:  Recent Labs Lab 11/09/12 0521 11/09/12 1101 11/09/12 1605 11/09/12 2134 11/10/12 0600  GLUCAP 178* 229*  187* 235* 170*    No results found for this or any previous visit (from the past 240 hour(s)).   Scheduled Meds: . sodium chloride   Intravenous Q7 days  . acetaZOLAMIDE  250 mg Oral BID  . amLODipine  10 mg Oral Daily  . carvedilol  6.25 mg Oral BID WC  . darbepoetin  25 mcg Subcutaneous Q Thu-1800  . docusate sodium  100 mg Oral BID  . ferrous sulfate  325 mg Oral BID WC  . ferumoxytol  510 mg Intravenous Q7 days  . furosemide  120 mg Intravenous Q8H  . insulin aspart  0-15 Units Subcutaneous TID WC  . insulin aspart  0-5 Units Subcutaneous QHS  . insulin glargine  15 Units Subcutaneous QHS  . isosorbide mononitrate  30 mg Oral Daily  . levothyroxine  137 mcg Oral Custom  . levothyroxine  274 mcg Oral Q7 days  . metolazone  5 mg Oral Daily  . nystatin-triamcinolone  1 application Topical BID  . pantoprazole  40 mg Oral BID  . potassium chloride  40 mEq Oral BID  . senna  2 tablet Oral Daily  . silver sulfADIAZINE  1 application Topical Daily  . simvastatin  20 mg Oral q1800  .  sodium chloride  3 mL Intravenous Q12H  . warfarin  6 mg Oral Custom   And  . warfarin  9 mg Oral Custom  . Warfarin - Pharmacist Dosing Inpatient   Does not apply q1800   Continuous Infusions:    Cicily Bonano, MD  [facility] Pager [phone]  If 7PM-7AM, please contact night-coverage [email or web address] Password [email or web address] 11/10/2012, 10:07 AM   LOS: 7 days

## 2012-11-10 NOTE — Progress Notes (Addendum)
cirt CRITICAL VALUE ALERT  Critical value received: CO2 40  Date of notification:  7-5  Time of notification:  750  Critical value read back:yes  Nurse who received alert:  Nanetta Batty  MD notified (1st page):  [email or web address]  Time of first page:  14  MD notified (2nd page):  Time of second page:  Responding MD:  [id number]  Time MD responded:  812

## 2012-11-10 NOTE — Progress Notes (Signed)
Pt O2 drops into 80s when sleeping at time per family. Pt put back on O2 2L.   Family helping pt into bathroom. Family forgot to record urine. Remind family that we are still recording her I&Os. Put an extra hat in room

## 2012-11-10 NOTE — Progress Notes (Signed)
Subjective:  C/o mild soreness under right breast.  No chest pain.  Still diuresing.  Good weight loss.   Objective:  Vital Signs in the last 24 hours: BP 129/51  Pulse 67  Temp(Src) 98.4 F (36.9 C) (Oral)  Resp 19  Ht 5\' 2"  (1.575 m)  Wt 91.309 kg (201 lb 4.8 oz)  BMI 36.81 kg/m2  SpO2 95%  Physical Exam: Pleasant obese WF in NAD Lungs:  Clear Cardiac:  Regular rhythm, normal S1 and S2, no S3 Abdomen:  Minimal RUQ tenderness today Extremities: 1-2+ edema present left greater than right today   Intake/Output from previous day: 07/04 0701 - 07/05 0700 In: [id number] [P.O.:1120; I.V.:3; IV Piggyback:372] Out: 2650 [Urine:2650] Weight Filed Weights   11/08/12 0519 11/09/12 0516 11/10/12 0412  Weight: 95.255 kg (210 lb) 93.396 kg (205 lb 14.4 oz) 91.309 kg (201 lb 4.8 oz)    Lab Results: Basic Metabolic Panel:  Recent Labs  11/09/12 0500 11/10/12 0550  NA 137 139  K 3.8 3.1*  CL 87* 86*  CO2 38* 40*  GLUCOSE 170* 172*  BUN 41* 47*  CREATININE 2.05* 2.08*    CBC:  Recent Labs  11/09/12 0500 11/10/12 0550  WBC 9.4 8.4  HGB 9.9* 10.0*  HCT 28.7* 30.7*  MCV 90.8 91.1  PLT 219 178   BNP    Component Value Date/Time   PROBNP 4745.0* 11/08/2012 0438    PROTIME: Lab Results  Component Value Date   INR 2.48* 11/09/2012   INR 2.53* 11/08/2012   INR 2.68* 11/07/2012   Telemetry: Atrial fibrillation with controlled response  Assessment/Plan:  1. Acute on chronic diastolic CHF improved 2. Chronic a fib 3. CKD stage 4 4. Contraction alkalosis likely due to extensive diuresis  Rec:  Add diamox.  With continued edema IV diuresis for one more day and change to po in am.  Possible d/c Mon.     Kerry Hough  MD [facility]  11/10/2012, 8:29 AM

## 2012-11-11 DIAGNOSIS — M109 Gout, unspecified: Secondary | ICD-10-CM | POA: Diagnosis present

## 2012-11-11 LAB — GLUCOSE, CAPILLARY
Glucose-Capillary: 221 mg/dL — ABNORMAL HIGH (ref 70–99)
Glucose-Capillary: 179 mg/dL — ABNORMAL HIGH (ref 70–99)
Glucose-Capillary: 206 mg/dL — ABNORMAL HIGH (ref 70–99)

## 2012-11-11 LAB — BASIC METABOLIC PANEL
CO2: 41 mEq/L (ref 19–32)
Chloride: 86 mEq/L — ABNORMAL LOW (ref 96–112)
Creatinine, Ser: 2.17 mg/dL — ABNORMAL HIGH (ref 0.50–1.10)
GFR calc Af Amer: 23 mL/min — ABNORMAL LOW (ref >90)
Potassium: 3.4 mEq/L — ABNORMAL LOW (ref 3.5–5.1)

## 2012-11-11 LAB — CBC
HCT: 32.9 % — ABNORMAL LOW (ref 36.0–46.0)
Hemoglobin: 10.7 g/dL — ABNORMAL LOW (ref 12.0–15.0)
MCV: 92.2 fL (ref 78.0–100.0)
RBC: 3.57 MIL/uL — ABNORMAL LOW (ref 3.87–5.11)
RDW: 17.3 % — ABNORMAL HIGH (ref 11.5–15.5)
WBC: 10.3 10*3/uL (ref 4.0–10.5)

## 2012-11-11 MED ORDER — FUROSEMIDE 80 MG PO TABS
120.0000 mg | ORAL_TABLET | Freq: Two times a day (BID) | ORAL | Status: DC
  Administered 2012-11-11 – 2012-11-13 (×5): 120 mg via ORAL
  Filled 2012-11-11 (×8): qty 1

## 2012-11-11 MED ORDER — POTASSIUM CHLORIDE CRYS ER 20 MEQ PO TBCR: 40.0000 meq | EXTENDED_RELEASE_TABLET | Freq: Once | ORAL | Status: DC

## 2012-11-11 MED ORDER — METHYLPREDNISOLONE SODIUM SUCC 125 MG IJ SOLR
60.0000 mg | Freq: Once | INTRAMUSCULAR | Status: AC
  Administered 2012-11-11: 60 mg via INTRAVENOUS
  Filled 2012-11-11: qty 0.96

## 2012-11-11 MED ORDER — PREDNISONE 20 MG PO TABS
40.0000 mg | ORAL_TABLET | Freq: Every day | ORAL | Status: DC
  Administered 2012-11-12 – 2012-11-13 (×2): 40 mg via ORAL
  Filled 2012-11-11 (×3): qty 2

## 2012-11-11 NOTE — Progress Notes (Signed)
Patient was having c/o intermittent BLE and foot pain.  PRN Ultram given.  Patient currently pain free and resting comfortably.  Will continue to monitor. Tresa Endo

## 2012-11-11 NOTE — Progress Notes (Signed)
[facility] PROGRESS NOTE  Madeline [name] [id number] DOB: [birth year] DOA: 11/03/2012 PCP: Abigail Miyamoto, MD  Assessment/Plan: Acute on chronic diastolic CHF  -99991111 echo--EF 0000000, grade 3 diastolic dysfunction  I/O = -1441/past 24 hrs. Weight 93.39 kg from 100.4kg from 103.1kg. -Continue Imdur and carvedilol  -Lasix has been changed to oral dosing and metolazone has been discontinued per cardiology. - continue fluid restriction, daily weights  -pt is quite diuretic resistant-->added metolazone 5 days ago, and discontinued today. -home dose of furosemide is 120mg  po bid -clinically improving -accurate I/Os  Cardiology following and appreciate input and recommendations. Contraction alkalosis Secondary to diuretics. Diamox added to regimen per cardiology. It metolazone has been discontinued. Lasix has been changed to oral. Follow. CKD stage IV  -Previous serum creatinine 2.57 on 10/05/2012 and 2.12 on 10/22/2012  -serum creatinine remains stable and slowly improving on IV furosemide. Creatinine today is 2.17. -Renal ultrasound- negative for hydronephrosis  -Patient normally follows [facility] for her CKD  Acute gout flare Patient complaining of right dorsal foot pain. Uric acid level was checked and is elevated at 16.5. Patient likely an acute gout flare probably percent dictated by diuresis. Will place on a steroid burst. Follow. Hypokalemia Secondary to diuresis. Replete aggressively. Generalize weakness  -Likely multifactorial  -PT evaluation  Permanent Atrial fibrillation  -Rate controlled  -Continue carvedilol for rate control -Continue warfarin for anti-coagulation Diabetes mellitus type 2  -Hemoglobin A1c 6.0 in 10/04/2012  -CBGs 208-221 . -Hold glimeperide and Januvia during hospitalization  -Increase  Lantus to 24units q hs. -At bedtime NovoLog -Continue NovoLog sliding scale Hypothyroidism  -TSH 1.96 on 10/04/2012  -Patient's levothyroxine  was increased approximately 3 months ago. F/u as outpatient. Normocytic anemia  -Patient had hemoglobin 10.3 in March 2014  -Check iron studies--iron saturation 11%, B12--940  -RBC folate--2999  -IncreaseD ferrous sulfate to twice a day  Constipation -Colace and Senokot. Sorbitol when necessary. Family Communication: Updated patient, husband and daughter at beside  Disposition Plan: Home when medically stable          Procedures/Studies: Dg Chest 2 View  11/03/2012   *RADIOLOGY REPORT*  Clinical Data: Leg swelling, shortness of breath, history of CHF and atrial fibrillation  CHEST - 2 VIEW  Comparison: 10/22/2012  Findings: Grossly unchanged enlarged cardiac silhouette and mediastinal contours with atherosclerotic calcification within the thoracic aorta.  The pulmonary vasculature is indistinct with cephalization of flow.  Evaluation of retrosternal clear space obscured secondary to overlying soft tissues. Minimal bibasilar opacities, right greater than left, favored to represent atelectasis.  Suspected trace right-sided pleural effusion.  No pneumothorax.  Unchanged bones.  IMPRESSION: Mild pulmonary edema with suspected trace right-sided pleural effusion.   Original Report Authenticated By: Jake Seats, MD   Dg Chest 2 View  10/22/2012   *RADIOLOGY REPORT*  Clinical Data: Shortness of breath.  CHEST - 2 VIEW  Comparison: 10/03/2012 and 08/30/2012 radiographs.  Abdominal CT 01/12/2012.  Findings: Cardiomegaly and aortic atherosclerosis are stable. There is chronic central airway thickening which appears mildly progressive.  There is new mild blunting of the right costophrenic angle which could reflect a small right pleural effusion.  There is no focal airspace disease or pneumothorax.  Mild degenerative changes in the spine are noted.  IMPRESSION: Increased central airway thickening suspicious for bronchitis. Possible small right pleural effusion.  Stable cardiomegaly.   Original Report  Authenticated By: Richardean Sale, M.D.         Subjective: Patient states breathing is better.  Patient complaining of right dorsal foot pain. Patient stated that left dorsal foot pain 1-2 days ago.   Objective: Filed Vitals:   11/10/12 2038 11/11/12 0439 11/11/12 0900 11/11/12 1339  BP: 122/47 150/44 140/54 125/40  Pulse: 64 64 67 73  Temp: 98 F (36.7 C) 98.1 F (36.7 C)  97.3 F (36.3 C)  TempSrc: Oral Oral  Oral  Resp: 18 18  18   Height:      Weight:  89.2 kg (196 lb 10.4 oz)    SpO2: 95% 96%  93%    Intake/Output Summary (Last 24 hours) at 11/11/12 1445 Last data filed at 11/11/12 1400  Gross per 24 hour  Intake   1743 ml  Output   3451 ml  Net  -1708 ml   Weight change: -2.109 kg (-4 lb 10.4 oz) Exam:   General:  Pt is alert, follows commands appropriately, not in acute distress  HEENT: No icterus, No thrush, No neck mass, [place]/AT  Cardiovascular: Irregularly irregular  Respiratory: Bibasilar crackles R> L  Abdomen: Soft/+BS, non tender, non distended, no guarding  Extremities:  No lymphangitis, No petechiae, No rashes, no synovitis. 1+ bilateral lower extremity edema. Foot Right TTP.  Data Reviewed: Basic Metabolic Panel:  Recent Labs Lab 11/05/12 0710 11/06/12 0548  11/08/12 0438 11/08/12 1443 11/09/12 0500 11/10/12 0550 11/10/12 1140 11/11/12 0759  NA 140 139  < > 137 137 137 139 136 136  K 3.3* 3.2*  < > 2.7* 3.8 3.8 3.1* 3.2* 3.4*  CL 98 99  < > 89* 90* 87* 86* 84* 86*  CO2 31 30  < > 38* 36* 38* 40* 40* 41*  GLUCOSE 135* 147*  < > 169* 194* 170* 172* 215* 206*  BUN 27* 30*  < > 35* 37* 41* 47* 45* 52*  CREATININE 2.03* 2.02*  < > 1.87* 1.93* 2.05* 2.08* 1.91* 2.17*  CALCIUM 9.1 9.2  < > 9.7 10.1 10.1 10.1 10.1 10.4  MG  --  2.1  --  1.9  --   --   --   --   --   < > = values in this interval not displayed. Liver Function Tests: No results found for this basename: AST, ALT, ALKPHOS, BILITOT, PROT, ALBUMIN,  in the last 168 hours No  results found for this basename: LIPASE, AMYLASE,  in the last 168 hours No results found for this basename: AMMONIA,  in the last 168 hours CBC:  Recent Labs Lab 11/06/12 0548 11/08/12 0438 11/09/12 0500 11/10/12 0550 11/11/12 0759  WBC 6.9 7.3 9.4 8.4 10.3  HGB 8.9* 9.5* 9.9* 10.0* 10.7*  HCT 27.9* 28.9* 28.7* 30.7* 32.9*  MCV 94.3 92.0 90.8 91.1 92.2  PLT 151 163 219 178 222   Cardiac Enzymes: No results found for this basename: CKTOTAL, CKMB, CKMBINDEX, TROPONINI,  in the last 168 hours BNP: No components found with this basename: POCBNP,  CBG:  Recent Labs Lab 11/10/12 0600 11/10/12 1153 11/10/12 1600 11/10/12 2104 11/11/12 0542  GLUCAP 170* 202* 245* 208* 221*    No results found for this or any previous visit (from the past 240 hour(s)).   Scheduled Meds: . sodium chloride   Intravenous Q7 days  . acetaZOLAMIDE  250 mg Oral BID  . amLODipine  10 mg Oral Daily  . carvedilol  6.25 mg Oral BID WC  . darbepoetin  25 mcg Subcutaneous Q Thu-1800  . docusate sodium  100 mg Oral BID  . ferrous sulfate  325  mg Oral BID WC  . ferumoxytol  510 mg Intravenous Q7 days  . furosemide  120 mg Oral BID  . insulin aspart  0-15 Units Subcutaneous TID WC  . insulin aspart  0-5 Units Subcutaneous QHS  . insulin glargine  20 Units Subcutaneous QHS  . isosorbide mononitrate  30 mg Oral Daily  . levothyroxine  137 mcg Oral Custom  . levothyroxine  274 mcg Oral Q7 days  . nystatin-triamcinolone  1 application Topical BID  . pantoprazole  40 mg Oral BID  . potassium chloride  40 mEq Oral BID  . [START ON 11/12/2012] predniSONE  40 mg Oral QAC breakfast  . senna  2 tablet Oral Daily  . silver sulfADIAZINE  1 application Topical Daily  . simvastatin  20 mg Oral q1800  . sodium chloride  3 mL Intravenous Q12H  . warfarin  6 mg Oral Custom   And  . warfarin  9 mg Oral Custom  . Warfarin - Pharmacist Dosing Inpatient   Does not apply q1800   Continuous Infusions:     THOMPSON,DANIEL, MD  [facility] Pager [phone]  If 7PM-7AM, please contact night-coverage [email or web address] Password [email or web address] 11/11/2012, 2:45 PM   LOS: 8 days

## 2012-11-11 NOTE — Progress Notes (Signed)
Subjective:  Continues to improve and is not short of breath. Edema has gotten better. Her weight is down another 5 pounds.  Objective:  Vital Signs in the last 24 hours: BP 150/44  Pulse 64  Temp(Src) 98.1 F (36.7 C) (Oral)  Resp 18  Ht 5\' 2"  (1.575 m)  Wt 89.2 kg (196 lb 10.4 oz)  BMI 35.96 kg/m2  SpO2 96%  Physical Exam: Pleasant obese WF in NAD Lungs:  Clear Cardiac:  Regular rhythm, normal S1 and S2, no S3 Extremities: 1+edema present left greater than right today   Intake/Output from previous day: 07/05 0701 - 07/06 0700 In: 1560 [P.O.:1320] Out: 3001 [Urine:3000; Stool:1] Weight Filed Weights   11/09/12 0516 11/10/12 0412 11/11/12 0439  Weight: 93.396 kg (205 lb 14.4 oz) 91.309 kg (201 lb 4.8 oz) 89.2 kg (196 lb 10.4 oz)    Lab Results: Basic Metabolic Panel:  Recent Labs  11/10/12 1140 11/11/12 0759  NA 136 136  K 3.2* 3.4*  CL 84* 86*  CO2 40* 41*  GLUCOSE 215* 206*  BUN 45* 52*  CREATININE 1.91* 2.17*    CBC:  Recent Labs  11/10/12 0550 11/11/12 0759  WBC 8.4 10.3  HGB 10.0* 10.7*  HCT 30.7* 32.9*  MCV 91.1 92.2  PLT 178 222   BNP    Component Value Date/Time   PROBNP 4745.0* 11/08/2012 0438    PROTIME: Lab Results  Component Value Date   INR 2.48* 11/09/2012   INR 2.53* 11/08/2012   INR 2.68* 11/07/2012   Telemetry: Atrial fibrillation with controlled response  Assessment/Plan:  1. Acute on chronic diastolic CHF improved 2. Chronic a fib 3. CKD stage 4 4. Contraction alkalosis likely due to extensive diuresis  Rec:  With weight down further, I would stop her metolazone and IV furosemide. Continue Diamox for 1 more day for contraction alkalosis and go back on oral Lasix. She may need to have metolazone added  depending on what her weight does at home on an intermittent basis.     Kerry Hough  MD [facility]  11/11/2012, 9:22 AM

## 2012-11-12 LAB — BASIC METABOLIC PANEL
CO2: 39 mEq/L — ABNORMAL HIGH (ref 19–32)
Chloride: 84 mEq/L — ABNORMAL LOW (ref 96–112)
GFR calc Af Amer: 22 mL/min — ABNORMAL LOW (ref >90)
Potassium: 3.5 mEq/L (ref 3.5–5.1)
Sodium: 133 mEq/L — ABNORMAL LOW (ref 135–145)

## 2012-11-12 LAB — GLUCOSE, CAPILLARY
Glucose-Capillary: 273 mg/dL — ABNORMAL HIGH (ref 70–99)
Glucose-Capillary: 223 mg/dL — ABNORMAL HIGH (ref 70–99)
Glucose-Capillary: 400 mg/dL — ABNORMAL HIGH (ref 70–99)

## 2012-11-12 LAB — PROTIME-INR
INR: 3.24 — ABNORMAL HIGH (ref 0.00–1.49)
Prothrombin Time: 31.9 seconds — ABNORMAL HIGH (ref 11.6–15.2)

## 2012-11-12 MED ORDER — INSULIN GLARGINE 100 UNIT/ML ~~LOC~~ SOLN
24.0000 [IU] | Freq: Every day | SUBCUTANEOUS | Status: DC
  Administered 2012-11-12: 24 [IU] via SUBCUTANEOUS
  Filled 2012-11-12 (×2): qty 0.24

## 2012-11-12 MED ORDER — WARFARIN SODIUM 6 MG PO TABS: 9.0000 mg | ORAL_TABLET | ORAL | Status: DC

## 2012-11-12 MED ORDER — WARFARIN SODIUM 6 MG PO TABS
6.0000 mg | ORAL_TABLET | ORAL | Status: DC
  Filled 2012-11-12: qty 1

## 2012-11-12 NOTE — Progress Notes (Signed)
[facility] PROGRESS NOTE  Madeline [name] [id number] DOB: March 28, 1926 DOA: 11/03/2012 PCP: Abigail Miyamoto, MD  Assessment/Plan: Acute on chronic diastolic CHF  -99991111 echo--EF 0000000, grade 3 diastolic dysfunction  I/O = -1827/past 24 hrs. Weight 88.63 from 93.39 kg from 100.4kg from 103.1kg. -Continue Imdur and carvedilol  -Lasix has been changed to oral dosing and metolazone has been discontinued per cardiology. - continue fluid restriction, daily weights  -pt is quite diuretic resistant-->added metolazone 6 days ago, and discontinued. -home dose of furosemide is 120mg  po bid -clinically improving -accurate I/Os  Cardiology following and appreciate input and recommendations. Contraction alkalosis Secondary to diuretics. Improving. Diamox added to regimen per cardiology. Metolazone has been discontinued. Lasix has been changed to oral. Follow. CKD stage IV  -Previous serum creatinine 2.57 on 10/05/2012 and 2.12 on 10/22/2012  -serum creatinine remains stable and slowly improving on IV furosemide. Creatinine today is 2.20. -Renal ultrasound- negative for hydronephrosis  -Patient normally follows [facility] for her CKD  Acute gout flare Clinical improvement. Patient complaining of right dorsal foot pain. Uric acid level was checked and was elevated at 16.5. Patient likely an acute gout flare. Continue steriod burst. Follow. Hypokalemia Secondary to diuresis. Replete aggressively. Generalize weakness  -Likely multifactorial  -PT evaluation  Permanent Atrial fibrillation  -Rate controlled  -Continue carvedilol for rate control -Continue warfarin for anti-coagulation Diabetes mellitus type 2  -Hemoglobin A1c 6.0 in 10/04/2012  -CBGs 208-221 . -Hold glimeperide and Januvia during hospitalization  -Increase  Lantus to 24units q hs. -At bedtime NovoLog -Continue NovoLog sliding scale Hypothyroidism  -TSH 1.96 on 10/04/2012  -Patient's levothyroxine was  increased approximately 3 months ago. F/u as outpatient. Normocytic anemia  -Patient had hemoglobin 10.3 in March 2014  -Check iron studies--iron saturation 11%, B12--940  -RBC folate--2999  -IncreaseD ferrous sulfate to twice a day  Constipation -Colace and Senokot. Sorbitol when necessary. Family Communication: Updated patient, husband and daughter at beside  Disposition Plan: Home when medically stable hopefully tomorrow.          Procedures/Studies: Dg Chest 2 View  11/03/2012   *RADIOLOGY REPORT*  Clinical Data: Leg swelling, shortness of breath, history of CHF and atrial fibrillation  CHEST - 2 VIEW  Comparison: 10/22/2012  Findings: Grossly unchanged enlarged cardiac silhouette and mediastinal contours with atherosclerotic calcification within the thoracic aorta.  The pulmonary vasculature is indistinct with cephalization of flow.  Evaluation of retrosternal clear space obscured secondary to overlying soft tissues. Minimal bibasilar opacities, right greater than left, favored to represent atelectasis.  Suspected trace right-sided pleural effusion.  No pneumothorax.  Unchanged bones.  IMPRESSION: Mild pulmonary edema with suspected trace right-sided pleural effusion.   Original Report Authenticated By: Jake Seats, MD   Dg Chest 2 View  10/22/2012   *RADIOLOGY REPORT*  Clinical Data: Shortness of breath.  CHEST - 2 VIEW  Comparison: 10/03/2012 and 08/30/2012 radiographs.  Abdominal CT 01/12/2012.  Findings: Cardiomegaly and aortic atherosclerosis are stable. There is chronic central airway thickening which appears mildly progressive.  There is new mild blunting of the right costophrenic angle which could reflect a small right pleural effusion.  There is no focal airspace disease or pneumothorax.  Mild degenerative changes in the spine are noted.  IMPRESSION: Increased central airway thickening suspicious for bronchitis. Possible small right pleural effusion.  Stable cardiomegaly.    Original Report Authenticated By: Richardean Sale, M.D.         Subjective: Patient states breathing is better.  Patient states  right dorsal foot pain improving.   Objective: Filed Vitals:   11/11/12 2059 11/12/12 0434 11/12/12 0501 11/12/12 0920  BP: 125/53 140/40 141/40 114/52  Pulse: 70 73 70 57  Temp: 97.6 F (36.4 C) 97.3 F (36.3 C) 97.7 F (36.5 C)   TempSrc: Oral Oral Oral   Resp: 18 18 18 18   Height:      Weight:  88.633 kg (195 lb 6.4 oz) 88.633 kg (195 lb 6.4 oz)   SpO2: 96%  98% 94%    Intake/Output Summary (Last 24 hours) at 11/12/12 1126 Last data filed at 11/12/12 [id number]  Gross per 24 hour  Intake    940 ml  Output   2775 ml  Net  -1835 ml   Weight change: -0.567 kg (-1 lb 4 oz) Exam:   General:  Pt is alert, follows commands appropriately, not in acute distress  HEENT: No icterus, No thrush, No neck mass, [place]/AT  Cardiovascular: Irregularly irregular  Respiratory: CTAB  Abdomen: Soft/+BS, non tender, non distended, no guarding  Extremities:  No lymphangitis, No petechiae, No rashes, no synovitis. trace bilateral lower extremity edema. Foot Right  Less TTP.  Data Reviewed: Basic Metabolic Panel:  Recent Labs Lab 11/06/12 0548  11/08/12 0438  11/09/12 0500 11/10/12 0550 11/10/12 1140 11/11/12 0759 11/12/12 0400  NA 139  < > 137  < > 137 139 136 136 133*  K 3.2*  < > 2.7*  < > 3.8 3.1* 3.2* 3.4* 3.5  CL 99  < > 89*  < > 87* 86* 84* 86* 84*  CO2 30  < > 38*  < > 38* 40* 40* 41* 39*  GLUCOSE 147*  < > 169*  < > 170* 172* 215* 206* 297*  BUN 30*  < > 35*  < > 41* 47* 45* 52* 63*  CREATININE 2.02*  < > 1.87*  < > 2.05* 2.08* 1.91* 2.17* 2.20*  CALCIUM 9.2  < > 9.7  < > 10.1 10.1 10.1 10.4 10.0  MG 2.1  --  1.9  --   --   --   --   --   --   < > = values in this interval not displayed. Liver Function Tests: No results found for this basename: AST, ALT, ALKPHOS, BILITOT, PROT, ALBUMIN,  in the last 168 hours No results found for this  basename: LIPASE, AMYLASE,  in the last 168 hours No results found for this basename: AMMONIA,  in the last 168 hours CBC:  Recent Labs Lab 11/06/12 0548 11/08/12 0438 11/09/12 0500 11/10/12 0550 11/11/12 0759  WBC 6.9 7.3 9.4 8.4 10.3  HGB 8.9* 9.5* 9.9* 10.0* 10.7*  HCT 27.9* 28.9* 28.7* 30.7* 32.9*  MCV 94.3 92.0 90.8 91.1 92.2  PLT 151 163 219 178 222   Cardiac Enzymes: No results found for this basename: CKTOTAL, CKMB, CKMBINDEX, TROPONINI,  in the last 168 hours BNP: No components found with this basename: POCBNP,  CBG:  Recent Labs Lab 11/11/12 1113 11/11/12 1601 11/11/12 2042 11/12/12 0546 11/12/12 1104  GLUCAP 179* 206* 374* 273* 223*    No results found for this or any previous visit (from the past 240 hour(s)).   Scheduled Meds: . sodium chloride   Intravenous Q7 days  . amLODipine  10 mg Oral Daily  . carvedilol  6.25 mg Oral BID WC  . darbepoetin  25 mcg Subcutaneous Q Thu-1800  . docusate sodium  100 mg Oral BID  . ferrous sulfate  325  mg Oral BID WC  . ferumoxytol  510 mg Intravenous Q7 days  . furosemide  120 mg Oral BID  . insulin aspart  0-15 Units Subcutaneous TID WC  . insulin aspart  0-5 Units Subcutaneous QHS  . insulin glargine  24 Units Subcutaneous QHS  . isosorbide mononitrate  30 mg Oral Daily  . levothyroxine  137 mcg Oral Custom  . levothyroxine  274 mcg Oral Q7 days  . nystatin-triamcinolone  1 application Topical BID  . pantoprazole  40 mg Oral BID  . potassium chloride  40 mEq Oral BID  . predniSONE  40 mg Oral QAC breakfast  . senna  2 tablet Oral Daily  . silver sulfADIAZINE  1 application Topical Daily  . simvastatin  20 mg Oral q1800  . sodium chloride  3 mL Intravenous Q12H  . [START ON 11/13/2012] warfarin  6 mg Oral Custom   And  . [START ON 11/14/2012] warfarin  9 mg Oral Custom  . Warfarin - Pharmacist Dosing Inpatient   Does not apply q1800   Continuous Infusions:    Madeline Stauffer, MD  [facility] Pager  [phone]  If 7PM-7AM, please contact night-coverage [email or web address] Password [email or web address] 11/12/2012, 11:26 AM   LOS: 9 days

## 2012-11-12 NOTE — Progress Notes (Signed)
SUBJECTIVE:  No complaints of SOB  OBJECTIVE:   Vitals:   Filed Vitals:   11/11/12 2059 11/12/12 0434 11/12/12 0501 11/12/12 0920  BP: 125/53 140/40 141/40 114/52  Pulse: 70 73 70 57  Temp: 97.6 F (36.4 C) 97.3 F (36.3 C) 97.7 F (36.5 C)   TempSrc: Oral Oral Oral   Resp: 18 18 18 18   Height:      Weight:  88.633 kg (195 lb 6.4 oz) 88.633 kg (195 lb 6.4 oz)   SpO2: 96%  98% 94%   I&O's:   Intake/Output Summary (Last 24 hours) at 11/12/12 1156 Last data filed at 11/12/12 [id number]  Gross per 24 hour  Intake    940 ml  Output   2775 ml  Net  -1835 ml   TELEMETRY: Reviewed telemetry pt in NSR:     PHYSICAL EXAM General: Well developed, well nourished, in no acute distress Head: Eyes PERRLA, No xanthomas.   Normal cephalic and atramatic  Lungs:   Clear bilaterally to auscultation and percussion. Heart:   HRRR S1 S2 Pulses are 2+ & equal. Abdomen: Bowel sounds are positive, abdomen soft and non-tender without masses  Extremities:   No clubbing, cyanosis or edema.  DP +1 Neuro: Alert and oriented X 3. Psych:  Good affect, responds appropriately   LABS: Basic Metabolic Panel:  Recent Labs  11/11/12 0759 11/12/12 0400  NA 136 133*  K 3.4* 3.5  CL 86* 84*  CO2 41* 39*  GLUCOSE 206* 297*  BUN 52* 63*  CREATININE 2.17* 2.20*  CALCIUM 10.4 10.0   Liver Function Tests: No results found for this basename: AST, ALT, ALKPHOS, BILITOT, PROT, ALBUMIN,  in the last 72 hours No results found for this basename: LIPASE, AMYLASE,  in the last 72 hours CBC:  Recent Labs  11/10/12 0550 11/11/12 0759  WBC 8.4 10.3  HGB 10.0* 10.7*  HCT 30.7* 32.9*  MCV 91.1 92.2  PLT 178 222   Coag Panel:   Lab Results  Component Value Date   INR 3.24* 11/12/2012   INR 2.48* 11/09/2012   INR 2.53* 11/08/2012    RADIOLOGY: Dg Chest 2 View  11/03/2012   *RADIOLOGY REPORT*  Clinical Data: Leg swelling, shortness of breath, history of CHF and atrial fibrillation  CHEST - 2 VIEW  Comparison:  10/22/2012  Findings: Grossly unchanged enlarged cardiac silhouette and mediastinal contours with atherosclerotic calcification within the thoracic aorta.  The pulmonary vasculature is indistinct with cephalization of flow.  Evaluation of retrosternal clear space obscured secondary to overlying soft tissues. Minimal bibasilar opacities, right greater than left, favored to represent atelectasis.  Suspected trace right-sided pleural effusion.  No pneumothorax.  Unchanged bones.  IMPRESSION: Mild pulmonary edema with suspected trace right-sided pleural effusion.   Original Report Authenticated By: Jake Seats, MD   Dg Chest 2 View  10/22/2012   *RADIOLOGY REPORT*  Clinical Data: Shortness of breath.  CHEST - 2 VIEW  Comparison: 10/03/2012 and 08/30/2012 radiographs.  Abdominal CT 01/12/2012.  Findings: Cardiomegaly and aortic atherosclerosis are stable. There is chronic central airway thickening which appears mildly progressive.  There is new mild blunting of the right costophrenic angle which could reflect a small right pleural effusion.  There is no focal airspace disease or pneumothorax.  Mild degenerative changes in the spine are noted.  IMPRESSION: Increased central airway thickening suspicious for bronchitis. Possible small right pleural effusion.  Stable cardiomegaly.   Original Report Authenticated By: Richardean Sale, M.D.   Dg  Chest Port 1 View  11/06/2012   *RADIOLOGY REPORT*  Clinical Data: Cough.  Sputum production.  PORTABLE CHEST - 1 VIEW  Comparison: 11/03/2012.  Findings: Cardiomegaly is present.  Interstitial pulmonary edema is present at the lung bases.  Pulmonary vascular congestion.  There is no focal consolidation identified.  Probable tiny bilateral pleural effusions.  Aortic arch atherosclerosis.  IMPRESSION: Findings most compatible with mild CHF.  No focal consolidation to suggest pneumonia.   Original Report Authenticated By: Dereck Ligas, M.D.    Assessment/Plan:  1. Acute on  chronic diastolic CHF improved - Metolazone stopped yesterday and change to PO Lasix - net 1.8L out yesterday and down another Kg since yesterday. 2. Chronic a fib rate controlled 3. CKD stage 4  4. Contraction alkalosis likely due to extensive diuresis - improved  Rec:  stop Diamox. Continue PO Lasix OK from cardiac standpoint for discharge but needs close followup with Dr. Marlou Porch in 1 week      Sueanne Margarita, MD  11/12/2012  11:56 AM

## 2012-11-12 NOTE — Progress Notes (Signed)
Nutrition Brief Note  Malnutrition Screening Tool result is inaccurate.  Please consult if nutrition needs are identified.  Inda Coke MS, RD, LDN Pager: [phone] After-hours pager: [phone]

## 2012-11-12 NOTE — Progress Notes (Signed)
Inpatient Diabetes Program Recommendations  AACE/ADA: New Consensus Statement on Inpatient Glycemic Control (2013)  Target Ranges:  Prepandial:   less than 140 mg/dL      Peak postprandial:   less than 180 mg/dL (1-2 hours)      Critically ill patients:  140 - 180 mg/dL   Results for Madeline [name], Madeline [name] (MRN [id number]) as of 11/12/2012 11:51  Ref. Range 11/11/2012 05:42 11/11/2012 11:13 11/11/2012 16:01 11/11/2012 20:42 11/12/2012 05:46 11/12/2012 11:04  Glucose-Capillary Latest Range: 70-99 mg/dL 221 (H) 179 (H) 206 (H) 374 (H) 273 (H) 223 (H)    Inpatient Diabetes Program Recommendations Correction (SSI): Please consider increasing Novolog correction to resistant scale.  Note: Blood glucose over the past 30 hours has ranged from 179-374 mg/dl.  Noted that Lantus was increased today from 20 to 24 units QHS.  Please consider increasing Novolog correction to resistant scale to further improve inpatient glycemic control.  Will continue to follow.  Thanks, Barnie Alderman, RN, MSN, CCRN Diabetes Coordinator Inpatient Diabetes Program [phone]

## 2012-11-12 NOTE — Progress Notes (Signed)
ANTICOAGULATION CONSULT NOTE - Follow Up Consult  Pharmacy Consult for coumadin Indication: atrial fibrillation  No Known Allergies  Labs:  Recent Labs  11/10/12 0550 11/10/12 1140 11/11/12 0759 11/12/12 0400  HGB 10.0*  --  10.7*  --   HCT 30.7*  --  32.9*  --   PLT 178  --  222  --   LABPROT  --   --   --  31.9*  INR  --   --   --  3.24*  CREATININE 2.08* 1.91* 2.17* 2.20*    Estimated Creatinine Clearance: 19.3 ml/min (by C-G formula based on Cr of 2.2).  Assessment: Patient is an 79 y.o F on coumadin for hx Afib.  INR supra-therapeutic this AM at 3.24  Goal of Therapy:  INR 2-3  Plan:  1) No Coumadin today - most likely able to resume home dose tomorrow 2) Daily INR  Thank you. Anette Guarneri, PharmD [phone]  11/12/2012,9:01 AM

## 2012-11-12 NOTE — Progress Notes (Signed)
11/12/12 1430 In to speak with pt. and daughter about DME hospital bed.  Pt. has CHF which requires her HOB elevated while sleeping.  TC to [place], with [place] to give referral.  Pt. may be dc tomorrow. Llana Aliment, RN, BSN NCM [phone]

## 2012-11-12 NOTE — Progress Notes (Signed)
Physical Therapy Treatment Patient Details Name: Madeline [name] MRN: [id number] DOB: 1925/10/11 Today's Date: 11/12/2012 Time: [id number] PT Time Calculation (min): 24 min  PT Assessment / Plan / Recommendation  PT Comments   Pt making progress with PT goals demonstrated by decreased assistance level when performing transfers and increased ambulation distance. Pt was seen in chair with husband and daughter present. Her daughter is very active in her Tx sessions and appears fully capable of providing assistance for the pt at d/c.  Follow Up Recommendations  No PT follow up;Supervision/Assistance - 24 hour     Does the patient have the potential to tolerate intense rehabilitation     Barriers to Discharge        Equipment Recommendations  None recommended by PT    Recommendations for Other Services    Frequency Min 3X/week   Progress towards PT Goals Progress towards PT goals: Progressing toward goals  Plan Current plan remains appropriate    Precautions / Restrictions Precautions Precautions: Fall Restrictions Weight Bearing Restrictions: No   Pertinent Vitals/Pain Patient made no reports of pain during today's treatment session. Patient ambulated and exercised on RA with 02sats staying >90%.    Mobility  Bed Mobility Bed Mobility: Not assessed Transfers Transfers: Sit to Stand;Stand to Sit Sit to Stand: 5: Supervision;With upper extremity assist;With armrests;From chair/3-in-1 Stand to Sit: 5: Supervision;With upper extremity assist;With armrests;To chair/3-in-1 Details for Transfer Assistance: VC for hand placement to push up from chair not pull on RW. Ambulation/Gait Ambulation/Gait Assistance: 4: Min guard Ambulation Distance (Feet): 220 Feet Assistive device: Rolling walker Ambulation/Gait Assistance Details: repeated VC to "look straight ahead" and maintain RW close to the body. Pt has short, shuffled steps and flexed trunk. Min guard for ambulation due to need for  repeated cueing of safely using the RW. Gait Pattern: Step-through pattern;Decreased stride length;Shuffle Gait velocity: decr General Gait Details: pt requires use of RW for safe ambulation Stairs: No    Exercises General Exercises - Lower Extremity Ankle Circles/Pumps: AROM;Both;10 reps;Seated Long Arc Quad: AROM;Both;10 reps;Seated Straight Leg Raises: AROM;Both;10 reps;Seated Hip Flexion/Marching: AROM;Both;10 reps;Seated Toe Raises: AROM;Both;10 reps;Seated Heel Raises: AROM;Both;10 reps;Seated      PT Goals (current goals can now be found in the care plan section) Acute Rehab PT Goals Time For Goal Achievement: 11/20/12 Potential to Achieve Goals: Good  Visit Information  Last PT Received On: 11/12/12 Assistance Needed: +1 History of Present Illness: Pt admitted for SOB and LE swelling. Pt found to have afib and with 2 episodes of V-tach.    Subjective Data      Cognition  Cognition Arousal/Alertness: Awake/alert Behavior During Therapy: WFL for tasks assessed/performed Overall Cognitive Status: Within Functional Limits for tasks assessed    Balance     End of Session PT - End of Session Equipment Utilized During Treatment: Gait belt Activity Tolerance: Patient tolerated treatment well Patient left: in chair;with call bell/phone within reach;with family/visitor present Nurse Communication: Mobility status   GP     Madeline [name], [place] 11/12/2012, 2:23 PM

## 2012-11-13 DIAGNOSIS — N390 Urinary tract infection, site not specified: Secondary | ICD-10-CM | POA: Diagnosis not present

## 2012-11-13 LAB — PROTIME-INR
INR: 3.91 — ABNORMAL HIGH (ref 0.00–1.49)
Prothrombin Time: 36.8 seconds — ABNORMAL HIGH (ref 11.6–15.2)

## 2012-11-13 LAB — URINALYSIS, ROUTINE W REFLEX MICROSCOPIC
Bilirubin Urine: NEGATIVE
Glucose, UA: NEGATIVE mg/dL
Hgb urine dipstick: NEGATIVE
Specific Gravity, Urine: 1.012 (ref 1.005–1.030)
pH: 6.5 (ref 5.0–8.0)

## 2012-11-13 LAB — BASIC METABOLIC PANEL
Calcium: 10 mg/dL (ref 8.4–10.5)
Chloride: 86 mEq/L — ABNORMAL LOW (ref 96–112)
Creatinine, Ser: 2.44 mg/dL — ABNORMAL HIGH (ref 0.50–1.10)
GFR calc Af Amer: 20 mL/min — ABNORMAL LOW (ref >90)
GFR calc non Af Amer: 17 mL/min — ABNORMAL LOW (ref >90)

## 2012-11-13 MED ORDER — POTASSIUM CHLORIDE CRYS ER 20 MEQ PO TBCR: 40.0000 meq | EXTENDED_RELEASE_TABLET | Freq: Two times a day (BID) | ORAL | Status: AC

## 2012-11-13 MED ORDER — CEFUROXIME AXETIL 250 MG PO TABS: 250.0000 mg | ORAL_TABLET | Freq: Two times a day (BID) | ORAL | Status: AC

## 2012-11-13 MED ORDER — FUROSEMIDE 40 MG PO TABS: 120.0000 mg | ORAL_TABLET | Freq: Two times a day (BID) | ORAL | Status: AC

## 2012-11-13 MED ORDER — FERROUS SULFATE 325 (65 FE) MG PO TABS: 325.0000 mg | ORAL_TABLET | Freq: Three times a day (TID) | ORAL | Status: AC

## 2012-11-13 MED ORDER — SENNA 8.6 MG PO TABS: 2.0000 | ORAL_TABLET | Freq: Every day | ORAL | Status: DC

## 2012-11-13 MED ORDER — PREDNISONE 20 MG PO TABS: 40.0000 mg | ORAL_TABLET | Freq: Every day | ORAL | Status: AC

## 2012-11-13 MED ORDER — CEFUROXIME AXETIL 250 MG PO TABS
250.0000 mg | ORAL_TABLET | Freq: Two times a day (BID) | ORAL | Status: DC
  Administered 2012-11-13: 250 mg via ORAL
  Filled 2012-11-13 (×2): qty 1

## 2012-11-13 MED ORDER — POLYETHYLENE GLYCOL 3350 17 G PO PACK: 17.0000 g | PACK | Freq: Every day | ORAL | Status: DC | PRN

## 2012-11-13 MED ORDER — DSS 100 MG PO CAPS: 100.0000 mg | ORAL_CAPSULE | Freq: Two times a day (BID) | ORAL | Status: AC

## 2012-11-13 MED ORDER — POTASSIUM CHLORIDE CRYS ER 20 MEQ PO TBCR
40.0000 meq | EXTENDED_RELEASE_TABLET | Freq: Once | ORAL | Status: AC
  Administered 2012-11-13: 40 meq via ORAL
  Filled 2012-11-13: qty 2

## 2012-11-13 NOTE — Progress Notes (Signed)
Madeline [name], [place] [phone] 11/13/2012

## 2012-11-13 NOTE — Discharge Summary (Signed)
Physician Discharge Summary  Madeline [name] [id number] DOB: 12-03-1925 DOA: 11/03/2012  PCP: Abigail Miyamoto, MD  Admit date: 11/03/2012 Discharge date: 11/13/2012  Time spent: 70 minutes  Recommendations for Outpatient Follow-up:  1. Patient is to followup with PCP one week post discharge. All followup basic metabolic profile negative be obtained to followup on patient's electrolytes and renal function. Patient's acute gout will also need to prerenal since that time. Patient's diabetes was also noted to be reassessed. 2. Patient is to followup with her cardiologist, Dr. Marlou Porch one week post discharge to followup on hospitalization for acute on chronic CHF exacerbation. A basic metabolic profile negative be obtained at that time. In further management of patient's CHF. 3. Patient is to have her Coumadin checked on Thursday, 11/15/2012 results need to be called into the Coumadin clinic for further instructions or recommendations. On day of discharge patient's Coumadin was supratherapeutic with an INR of 3.91. Patient has been discharged off of her Coumadin.  Discharge Diagnoses:  Principal Problem:   Acute on chronic diastolic CHF (congestive heart failure) Active Problems:   Physical deconditioning   Unspecified hypothyroidism   Acute diastolic CHF (congestive heart failure)   CKD (chronic kidney disease) stage 4, GFR 15-29 ml/min   DM2 (diabetes mellitus, type 2)   Systolic and diastolic CHF, acute on chronic   Hypokalemia   Atrial fibrillation   Anemia   Unspecified constipation   Alkalosis   Gout flare   UTI (urinary tract infection)   Discharge Condition: Stable  Diet recommendation: Heart healthy  Filed Weights   11/12/12 0434 11/12/12 0501 11/13/12 [id number]  Weight: 88.633 kg (195 lb 6.4 oz) 88.633 kg (195 lb 6.4 oz) 88.27 kg (194 lb 9.6 oz)    History of present illness:  Madeline [name] is a 79 y.o. female recently admitted 1 month ago with CHF who presents to [facility] with 6  day history of SOB, increasing pedal edema, and 8 lb weight gain. Yesterday her PCP tried switching her po diureitc from lasix to demedex without improvement. 2 days ago she received a lasix injection in the office which was not effective.  Regarding her CHF: patient has known history of grade 3 diastolic CHF with normal EF on 2d ECHO last month. This is complicated by CKD stage 4.  In the ED she was confirmed to have worsening peripheral and pulmonary edema, this improved with IV lasix, she has been sent to [facility] for admission for CHF.      Hospital Course:  Acute on chronic diastolic CHF  Patient was admitted with shortness of breath increased lower extremity edema and 8 pound weight gain. Patient had been sent from [facility] to [facility] without improvement. Due to person to worsening CHF exacerbation had presented to the hospital which was administered. Patient had a 2-D echo done on 10/04/2012 and a such this was not repeated. Echo showed an EF of 55-60% with grade 3 diastolic dysfunction. Patient was initially placed on IV Lasix however would not significant diureses metolazone was added to her regimen. Patient started to have good urine output and cardiology consultation was obtained. Patient was seen in consultation by Dr. Irish Lack and followed. Patient was also maintained on Imdur and Coreg. Patient improved clinically and was -18.35 L throughout the hospitalization. Metolazone was subsequently discontinued and IV Lasix was transitioned to oral Lasix. Patient continued to improve clinically on a daily basis and be discharged home on Lasix 120 mg twice daily. Patient is to followup with her cardiologist  one week post discharge for hospital followup. Patient will be discharged in stable and improved condition.  Contraction alkalosis  Patient was noted to have a contraction alkalosis which was felt to be secondary to diuretics. Patient was placed on diamox for 2 days. Patient's metolazone was discontinued  and IV Lasix changed to oral Lasix. Patient's alkalosis improved and will followup as outpatient.  CKD stage IV  -Previous serum creatinine 2.57 on 10/05/2012 and 2.12 on 10/22/2012  -serum creatinine remained stable and slowly improved on IV furosemide. Creatinine on day of discharge was 2.44.  -Renal ultrasound- negative for hydronephrosis. -Patient normally follows [facility] for her CKD  Acute gout flare  Patient complained of right dorsal foot pain. Uric acid level was checked and was elevated at 16.5. Patient likely had an acute gout flare. Patient was started on a short steroid burst with significant improvement. Patient be discharged home on 1 more day of oral prednisone to complete a five-day course.  Hypokalemia  Secondary to diuresis. Repleted aggressively. Will need outpatient followup. Generalize weakness  -Likely multifactorial. Patient was seen by physical therapy patient improved during the hospitalization and the discharged home in stable condition. Permanent Atrial fibrillation  Patient was maintained on Coreg for rate control and Coumadin resumed for anti-coagulation. Patient's atrial fibrillation remained stable throughout the hospitalization. Diabetes mellitus type 2  -Hemoglobin A1c 6.0 in 10/04/2012  -CBGs elevated in 200s secondary to steriods. .  -Held glimeperide and Januvia during hospitalization  -Patient maintained on Lantus and SSI. F/u as outpatient. Hypothyroidism  -TSH 1.96 on 10/04/2012  -Patient's levothyroxine was increased approximately 3 months ago. F/u as outpatient.  Normocytic anemia  -Patient had hemoglobin 10.3 in March 2014  -Check iron studies--iron saturation 11%, B12--940  -RBC folate--2999  -Increased ferrous sulfate to three times a day  Supratherapeutic INR During the hospitalization patient's INR became supratherapeutic and was increasing. Patient's Coumadin has been held over the past 48 hours. On day of discharge patient's  INR was 3.91. No bleeding was noted. Patient has been instructed to hold her Coumadin. Patient is to get a PT/INR drawn on Thursday, 11/15/2012 results called into the Coumadin clinic for further instructions. Constipation  -Colace and Senokot. Sorbitol when necessary.   Procedures:  Chest x-ray 11/03/2012, 11/06/2012  Consultations:  Cardiology: Dr. Fransico Him  Discharge Exam: Filed Vitals:   11/12/12 1411 11/12/12 2151 11/13/12 0608 11/13/12 1056  BP: 118/41 124/42 122/46 125/43  Pulse: 70 58 54 56  Temp: 97.5 F (36.4 C) 97.3 F (36.3 C) 98 F (36.7 C)   TempSrc: Oral Oral Oral   Resp: 19 16 16 18   Height:      Weight:   88.27 kg (194 lb 9.6 oz)   SpO2: 96% 100% 95% 93%    General: NAD Cardiovascular: RRR Respiratory: CTAB  Discharge Instructions  Discharge Orders   Future Orders Complete By Expires     Diet - low sodium heart healthy  As directed     Discharge instructions  As directed     Comments:      Follow up with FRIED, ROBERT L, MD in 1 week. Follow up with Dr Marlou Porch in 1 week. Check coumadin levels/ PT/INR on Thursday 11/15/12 and call results to coumadin clinic. Hold coumadin until given instructions from coumadin clinic.    Increase activity slowly  As directed         Medication List    STOP taking these medications  torsemide 20 MG tablet  Commonly known as:  DEMADEX     warfarin 3 MG tablet  Commonly known as:  COUMADIN      TAKE these medications       amLODipine 10 MG tablet  Commonly known as:  NORVASC  Take 10 mg by mouth daily.     carvedilol 6.25 MG tablet  Commonly known as:  COREG  Take 6.25 mg by mouth 2 (two) times daily with a meal.     cefUROXime 250 MG tablet  Commonly known as:  CEFTIN  Take 1 tablet (250 mg total) by mouth 2 (two) times daily with a meal. Take for 7 days then stop.     cholecalciferol 1000 UNITS tablet  Commonly known as:  VITAMIN D  Take 1,000 Units by mouth daily.     DSS 100 MG Caps   Take 100 mg by mouth 2 (two) times daily. OTC     ferrous sulfate 325 (65 FE) MG tablet  Take 1 tablet (325 mg total) by mouth 3 (three) times daily with meals.     furosemide 40 MG tablet  Commonly known as:  LASIX  Take 3 tablets (120 mg total) by mouth 2 (two) times daily.     glimepiride 4 MG tablet  Commonly known as:  AMARYL  Take 4 mg by mouth 2 (two) times daily.     insulin glargine 100 UNIT/ML injection  Commonly known as:  LANTUS  Inject 25 Units into the skin at bedtime.     isosorbide mononitrate 30 MG 24 hr tablet  Commonly known as:  IMDUR  Take 30 mg by mouth daily.     levothyroxine 137 MCG tablet  Commonly known as:  SYNTHROID, LEVOTHROID  Take 137-274 mcg by mouth daily before breakfast. Takes 2 tablets on Sunday, takes 1 tablet on all remaining days.     nitroGLYCERIN 0.4 MG SL tablet  Commonly known as:  NITROSTAT  Place 0.4 mg under the tongue every 5 (five) minutes as needed for chest pain.     nystatin-triamcinolone cream  Commonly known as:  MYCOLOG II  Apply 1 application topically 2 (two) times daily.     pantoprazole 40 MG tablet  Commonly known as:  PROTONIX  Take 40 mg by mouth 2 (two) times daily.     polyethylene glycol packet  Commonly known as:  MIRALAX / GLYCOLAX  Take 17 g by mouth daily as needed. otc     potassium chloride SA 20 MEQ tablet  Commonly known as:  K-DUR,KLOR-CON  Take 2 tablets (40 mEq total) by mouth 2 (two) times daily.     pravastatin 40 MG tablet  Commonly known as:  PRAVACHOL  Take 40 mg by mouth daily.     predniSONE 20 MG tablet  Commonly known as:  DELTASONE  Take 2 tablets (40 mg total) by mouth daily before breakfast. Take for 1 day then stop.     senna 8.6 MG Tabs  Commonly known as:  SENOKOT  Take 2 tablets (17.2 mg total) by mouth daily. otc     silver sulfADIAZINE 1 % cream  Commonly known as:  SILVADENE  Apply 1 application topically daily.     sitaGLIPtin 50 MG tablet  Commonly known as:   JANUVIA  Take 25 mg by mouth daily.     traMADol 50 MG tablet  Commonly known as:  ULTRAM  Take 1 tablet (50 mg total) by mouth every 6 (six) hours  as needed for pain.       No Known Allergies     Follow-up Information   Follow up with [facility]. (Home health nurse and physical therapy)    Contact information:   [phone]      Follow up with FRIED, ROBERT L, MD In 1 week.   Contact information:   [place] [address] [phone]       Follow up with Candee Furbish, MD. Schedule an appointment as soon as possible for a visit in 1 week.   Contact information:   [place] [address] [phone]        The results of significant diagnostics from this hospitalization (including imaging, microbiology, ancillary and laboratory) are listed below for reference.    Significant Diagnostic Studies: Dg Chest 2 View  11/03/2012   *RADIOLOGY REPORT*  Clinical Data: Leg swelling, shortness of breath, history of CHF and atrial fibrillation  CHEST - 2 VIEW  Comparison: 10/22/2012  Findings: Grossly unchanged enlarged cardiac silhouette and mediastinal contours with atherosclerotic calcification within the thoracic aorta.  The pulmonary vasculature is indistinct with cephalization of flow.  Evaluation of retrosternal clear space obscured secondary to overlying soft tissues. Minimal bibasilar opacities, right greater than left, favored to represent atelectasis.  Suspected trace right-sided pleural effusion.  No pneumothorax.  Unchanged bones.  IMPRESSION: Mild pulmonary edema with suspected trace right-sided pleural effusion.   Original Report Authenticated By: Jake Seats, MD   Dg Chest 2 View  10/22/2012   *RADIOLOGY REPORT*  Clinical Data: Shortness of breath.  CHEST - 2 VIEW  Comparison: 10/03/2012 and 08/30/2012 radiographs.  Abdominal CT 01/12/2012.  Findings: Cardiomegaly and aortic atherosclerosis are stable. There is chronic central airway thickening  which appears mildly progressive.  There is new mild blunting of the right costophrenic angle which could reflect a small right pleural effusion.  There is no focal airspace disease or pneumothorax.  Mild degenerative changes in the spine are noted.  IMPRESSION: Increased central airway thickening suspicious for bronchitis. Possible small right pleural effusion.  Stable cardiomegaly.   Original Report Authenticated By: Richardean Sale, M.D.   Dg Chest Port 1 View  11/06/2012   *RADIOLOGY REPORT*  Clinical Data: Cough.  Sputum production.  PORTABLE CHEST - 1 VIEW  Comparison: 11/03/2012.  Findings: Cardiomegaly is present.  Interstitial pulmonary edema is present at the lung bases.  Pulmonary vascular congestion.  There is no focal consolidation identified.  Probable tiny bilateral pleural effusions.  Aortic arch atherosclerosis.  IMPRESSION: Findings most compatible with mild CHF.  No focal consolidation to suggest pneumonia.   Original Report Authenticated By: Dereck Ligas, M.D.    Microbiology: No results found for this or any previous visit (from the past 240 hour(s)).   Labs: Basic Metabolic Panel:  Recent Labs Lab 11/08/12 0438  11/10/12 0550 11/10/12 1140 11/11/12 0759 11/12/12 0400 11/13/12 0410  NA 137  < > 139 136 136 133* 136  K 2.7*  < > 3.1* 3.2* 3.4* 3.5 3.3*  CL 89*  < > 86* 84* 86* 84* 86*  CO2 38*  < > 40* 40* 41* 39* 39*  GLUCOSE 169*  < > 172* 215* 206* 297* 246*  BUN 35*  < > 47* 45* 52* 63* 84*  CREATININE 1.87*  < > 2.08* 1.91* 2.17* 2.20* 2.44*  CALCIUM 9.7  < > 10.1 10.1 10.4 10.0 10.0  MG 1.9  --   --   --   --   --   --   < > =  values in this interval not displayed. Liver Function Tests: No results found for this basename: AST, ALT, ALKPHOS, BILITOT, PROT, ALBUMIN,  in the last 168 hours No results found for this basename: LIPASE, AMYLASE,  in the last 168 hours No results found for this basename: AMMONIA,  in the last 168 hours CBC:  Recent Labs Lab  11/08/12 0438 11/09/12 0500 11/10/12 0550 11/11/12 0759  WBC 7.3 9.4 8.4 10.3  HGB 9.5* 9.9* 10.0* 10.7*  HCT 28.9* 28.7* 30.7* 32.9*  MCV 92.0 90.8 91.1 92.2  PLT 163 219 178 222   Cardiac Enzymes: No results found for this basename: CKTOTAL, CKMB, CKMBINDEX, TROPONINI,  in the last 168 hours BNP: BNP (last 3 results)  Recent Labs  10/22/12 1317 11/03/12 2300 11/08/12 0438  PROBNP 4388.0* 2029.0* 4745.0*   CBG:  Recent Labs Lab 11/12/12 1104 11/12/12 1634 11/12/12 2150 11/13/12 0625 11/13/12 1115  GLUCAP 223* 400* 365* 236* 266*       Signed:  Alexiah Koroma  [facility] 11/13/2012, 1:34 PM

## 2012-11-13 NOTE — Progress Notes (Signed)
Occupational Therapy Discharge Patient Details Name: Madeline [name] MRN: [id number] DOB: 1925/12/10 Today's Date: 11/13/2012 Time:  -     Patient discharged from OT services secondary to pt to D/C today. In to speak with pt and one of her primary care givers and they feel that they have all the needed DME at this point and pt will have 24 hour care for whatever she and her husband need. No further OT needs, acute OT will sign off..  Please see latest therapy progress note for current level of functioning and progress toward goals.    Progress and discharge plan discussed with patient and/or caregiver: Patient/Caregiver agrees with plan       Almon Register [id number] 11/13/2012, 1:33 PM

## 2012-11-13 NOTE — Progress Notes (Signed)
Pt d/c home w/family. D/c instructions and medications reviewed with Pt . Pt states understanding. All Pt's questions answered

## 2012-11-13 NOTE — Progress Notes (Signed)
ANTICOAGULATION CONSULT NOTE - Follow Up Consult  Pharmacy Consult for coumadin Indication: atrial fibrillation  No Known Allergies  Labs:  Recent Labs  11/11/12 0759 11/12/12 0400 11/13/12 0410  HGB 10.7*  --   --   HCT 32.9*  --   --   PLT 222  --   --   LABPROT  --  31.9* 36.8*  INR  --  3.24* 3.91*  CREATININE 2.17* 2.20* 2.44*    Estimated Creatinine Clearance: 17.4 ml/min (by C-G formula based on Cr of 2.44).  Assessment: Patient is an 79 y.o F on coumadin for hx Afib.  INR supra-therapeutic this AM at 3.91  Goal of Therapy:  INR 2-3  Plan:  1) Hold Coumadin 2) Daily INR  Thank you. Anette Guarneri, PharmD [phone]  11/13/2012,8:56 AM

## 2012-11-13 NOTE — Progress Notes (Signed)
Inpatient Diabetes Program Recommendations  AACE/ADA: New Consensus Statement on Inpatient Glycemic Control (2013)  Target Ranges:  Prepandial:   less than 140 mg/dL      Peak postprandial:   less than 180 mg/dL (1-2 hours)      Critically ill patients:  140 - 180 mg/dL   Results for CARTINA, PROTHERO (MRN [id number]) as of 11/13/2012 12:52  Ref. Range 11/12/2012 05:46 11/12/2012 11:04 11/12/2012 16:34 11/12/2012 21:50 11/13/2012 06:25 11/13/2012 11:15  Glucose-Capillary Latest Range: 70-99 mg/dL 273 (H) 223 (H) 400 (H) 365 (H) 236 (H) 266 (H)    Inpatient Diabetes Program Recommendations Insulin - Basal: Please consider increasing Lantus to 30 units QHS. Correction (SSI): Please consider increasing Novolog correction to resistant scale.  Note: Blood glucose over the past 30 hours has ranged from 223-400 mg/dl.  Patient is ordered Prednisone 40 mg daily which is likely contributing to hyperglycemia.  Please increase Lantus to 30 units QHS and increase Novolog correction to resistant scale to improve inpatient glycemic control.  Will continue to follow.  Thanks, Barnie Alderman, RN, MSN, CCRN Diabetes Coordinator Inpatient Diabetes Program [phone]

## 2012-11-13 NOTE — Progress Notes (Addendum)
Urine malodorous  and cloudy per NT. Pt states no discomfort when urinating  Left sticky note for MD. Will follow up with MD later in shift

## 2012-11-14 DIAGNOSIS — Z794 Long term (current) use of insulin: Secondary | ICD-10-CM | POA: Diagnosis not present

## 2012-11-14 DIAGNOSIS — I509 Heart failure, unspecified: Secondary | ICD-10-CM | POA: Diagnosis not present

## 2012-11-14 DIAGNOSIS — E119 Type 2 diabetes mellitus without complications: Secondary | ICD-10-CM | POA: Diagnosis not present

## 2012-11-14 DIAGNOSIS — N189 Chronic kidney disease, unspecified: Secondary | ICD-10-CM | POA: Diagnosis not present

## 2012-11-14 DIAGNOSIS — I4891 Unspecified atrial fibrillation: Secondary | ICD-10-CM | POA: Diagnosis not present

## 2012-11-14 DIAGNOSIS — I129 Hypertensive chronic kidney disease with stage 1 through stage 4 chronic kidney disease, or unspecified chronic kidney disease: Secondary | ICD-10-CM | POA: Diagnosis not present

## 2012-11-15 DIAGNOSIS — N189 Chronic kidney disease, unspecified: Secondary | ICD-10-CM | POA: Diagnosis not present

## 2012-11-15 DIAGNOSIS — I509 Heart failure, unspecified: Secondary | ICD-10-CM | POA: Diagnosis not present

## 2012-11-15 DIAGNOSIS — I4891 Unspecified atrial fibrillation: Secondary | ICD-10-CM | POA: Diagnosis not present

## 2012-11-15 DIAGNOSIS — Z794 Long term (current) use of insulin: Secondary | ICD-10-CM | POA: Diagnosis not present

## 2012-11-15 DIAGNOSIS — E119 Type 2 diabetes mellitus without complications: Secondary | ICD-10-CM | POA: Diagnosis not present

## 2012-11-15 DIAGNOSIS — I129 Hypertensive chronic kidney disease with stage 1 through stage 4 chronic kidney disease, or unspecified chronic kidney disease: Secondary | ICD-10-CM | POA: Diagnosis not present

## 2012-11-15 LAB — URINE CULTURE

## 2012-11-16 DIAGNOSIS — I4891 Unspecified atrial fibrillation: Secondary | ICD-10-CM | POA: Diagnosis not present

## 2012-11-16 DIAGNOSIS — N189 Chronic kidney disease, unspecified: Secondary | ICD-10-CM | POA: Diagnosis not present

## 2012-11-16 DIAGNOSIS — Z794 Long term (current) use of insulin: Secondary | ICD-10-CM | POA: Diagnosis not present

## 2012-11-16 DIAGNOSIS — E119 Type 2 diabetes mellitus without complications: Secondary | ICD-10-CM | POA: Diagnosis not present

## 2012-11-16 DIAGNOSIS — I129 Hypertensive chronic kidney disease with stage 1 through stage 4 chronic kidney disease, or unspecified chronic kidney disease: Secondary | ICD-10-CM | POA: Diagnosis not present

## 2012-11-16 DIAGNOSIS — I509 Heart failure, unspecified: Secondary | ICD-10-CM | POA: Diagnosis not present

## 2012-11-19 DIAGNOSIS — E119 Type 2 diabetes mellitus without complications: Secondary | ICD-10-CM | POA: Diagnosis not present

## 2012-11-19 DIAGNOSIS — I129 Hypertensive chronic kidney disease with stage 1 through stage 4 chronic kidney disease, or unspecified chronic kidney disease: Secondary | ICD-10-CM | POA: Diagnosis not present

## 2012-11-19 DIAGNOSIS — N189 Chronic kidney disease, unspecified: Secondary | ICD-10-CM | POA: Diagnosis not present

## 2012-11-19 DIAGNOSIS — N390 Urinary tract infection, site not specified: Secondary | ICD-10-CM | POA: Diagnosis not present

## 2012-11-19 DIAGNOSIS — I4891 Unspecified atrial fibrillation: Secondary | ICD-10-CM | POA: Diagnosis not present

## 2012-11-19 DIAGNOSIS — I509 Heart failure, unspecified: Secondary | ICD-10-CM | POA: Diagnosis not present

## 2012-11-19 DIAGNOSIS — L989 Disorder of the skin and subcutaneous tissue, unspecified: Secondary | ICD-10-CM | POA: Diagnosis not present

## 2012-11-19 DIAGNOSIS — Z794 Long term (current) use of insulin: Secondary | ICD-10-CM | POA: Diagnosis not present

## 2012-11-19 DIAGNOSIS — E876 Hypokalemia: Secondary | ICD-10-CM | POA: Diagnosis not present

## 2012-11-19 DIAGNOSIS — N184 Chronic kidney disease, stage 4 (severe): Secondary | ICD-10-CM | POA: Diagnosis not present

## 2012-11-20 DIAGNOSIS — E119 Type 2 diabetes mellitus without complications: Secondary | ICD-10-CM | POA: Diagnosis not present

## 2012-11-20 DIAGNOSIS — I509 Heart failure, unspecified: Secondary | ICD-10-CM | POA: Diagnosis not present

## 2012-11-20 DIAGNOSIS — I4891 Unspecified atrial fibrillation: Secondary | ICD-10-CM | POA: Diagnosis not present

## 2012-11-20 DIAGNOSIS — N189 Chronic kidney disease, unspecified: Secondary | ICD-10-CM | POA: Diagnosis not present

## 2012-11-20 DIAGNOSIS — Z794 Long term (current) use of insulin: Secondary | ICD-10-CM | POA: Diagnosis not present

## 2012-11-20 DIAGNOSIS — I129 Hypertensive chronic kidney disease with stage 1 through stage 4 chronic kidney disease, or unspecified chronic kidney disease: Secondary | ICD-10-CM | POA: Diagnosis not present

## 2012-11-21 ENCOUNTER — Encounter (HOSPITAL_COMMUNITY): Payer: PRIVATE HEALTH INSURANCE

## 2012-11-21 ENCOUNTER — Other Ambulatory Visit (HOSPITAL_COMMUNITY): Payer: Self-pay | Admitting: *Deleted

## 2012-11-22 ENCOUNTER — Encounter (HOSPITAL_COMMUNITY): Payer: PRIVATE HEALTH INSURANCE

## 2012-11-22 ENCOUNTER — Encounter (HOSPITAL_COMMUNITY)
Admission: RE | Admit: 2012-11-22 | Discharge: 2012-11-22 | Disposition: A | Discharge status: Home / Self Care | Payer: Medicare Other | Source: Ambulatory Visit | Attending: Nephrology | Admitting: Nephrology

## 2012-11-22 DIAGNOSIS — I509 Heart failure, unspecified: Secondary | ICD-10-CM | POA: Insufficient documentation

## 2012-11-22 DIAGNOSIS — N184 Chronic kidney disease, stage 4 (severe): Secondary | ICD-10-CM | POA: Diagnosis not present

## 2012-11-22 DIAGNOSIS — I4891 Unspecified atrial fibrillation: Secondary | ICD-10-CM | POA: Diagnosis not present

## 2012-11-22 DIAGNOSIS — D649 Anemia, unspecified: Secondary | ICD-10-CM | POA: Insufficient documentation

## 2012-11-22 DIAGNOSIS — E039 Hypothyroidism, unspecified: Secondary | ICD-10-CM | POA: Insufficient documentation

## 2012-11-22 DIAGNOSIS — I5032 Chronic diastolic (congestive) heart failure: Secondary | ICD-10-CM | POA: Diagnosis not present

## 2012-11-22 DIAGNOSIS — Z7901 Long term (current) use of anticoagulants: Secondary | ICD-10-CM | POA: Diagnosis not present

## 2012-11-22 LAB — IRON AND TIBC: Iron: 78 ug/dL (ref 42–135)

## 2012-11-22 MED ORDER — EPOETIN ALFA 10000 UNIT/ML IJ SOLN: 10000.0000 [IU] | INTRAMUSCULAR | Status: DC

## 2012-11-23 DIAGNOSIS — I509 Heart failure, unspecified: Secondary | ICD-10-CM | POA: Diagnosis not present

## 2012-11-23 DIAGNOSIS — N189 Chronic kidney disease, unspecified: Secondary | ICD-10-CM | POA: Diagnosis not present

## 2012-11-23 DIAGNOSIS — I4891 Unspecified atrial fibrillation: Secondary | ICD-10-CM | POA: Diagnosis not present

## 2012-11-23 DIAGNOSIS — E119 Type 2 diabetes mellitus without complications: Secondary | ICD-10-CM | POA: Diagnosis not present

## 2012-11-23 DIAGNOSIS — I129 Hypertensive chronic kidney disease with stage 1 through stage 4 chronic kidney disease, or unspecified chronic kidney disease: Secondary | ICD-10-CM | POA: Diagnosis not present

## 2012-11-23 DIAGNOSIS — Z794 Long term (current) use of insulin: Secondary | ICD-10-CM | POA: Diagnosis not present

## 2012-11-27 DIAGNOSIS — E119 Type 2 diabetes mellitus without complications: Secondary | ICD-10-CM | POA: Diagnosis not present

## 2012-11-27 DIAGNOSIS — I4891 Unspecified atrial fibrillation: Secondary | ICD-10-CM | POA: Diagnosis not present

## 2012-11-27 DIAGNOSIS — I509 Heart failure, unspecified: Secondary | ICD-10-CM | POA: Diagnosis not present

## 2012-11-27 DIAGNOSIS — Z794 Long term (current) use of insulin: Secondary | ICD-10-CM | POA: Diagnosis not present

## 2012-11-27 DIAGNOSIS — N189 Chronic kidney disease, unspecified: Secondary | ICD-10-CM | POA: Diagnosis not present

## 2012-11-27 DIAGNOSIS — I129 Hypertensive chronic kidney disease with stage 1 through stage 4 chronic kidney disease, or unspecified chronic kidney disease: Secondary | ICD-10-CM | POA: Diagnosis not present

## 2012-11-28 DIAGNOSIS — N189 Chronic kidney disease, unspecified: Secondary | ICD-10-CM | POA: Diagnosis not present

## 2012-11-28 DIAGNOSIS — I509 Heart failure, unspecified: Secondary | ICD-10-CM | POA: Diagnosis not present

## 2012-11-28 DIAGNOSIS — Z794 Long term (current) use of insulin: Secondary | ICD-10-CM | POA: Diagnosis not present

## 2012-11-28 DIAGNOSIS — E119 Type 2 diabetes mellitus without complications: Secondary | ICD-10-CM | POA: Diagnosis not present

## 2012-11-28 DIAGNOSIS — I4891 Unspecified atrial fibrillation: Secondary | ICD-10-CM | POA: Diagnosis not present

## 2012-11-28 DIAGNOSIS — I129 Hypertensive chronic kidney disease with stage 1 through stage 4 chronic kidney disease, or unspecified chronic kidney disease: Secondary | ICD-10-CM | POA: Diagnosis not present

## 2012-11-30 DIAGNOSIS — I509 Heart failure, unspecified: Secondary | ICD-10-CM | POA: Diagnosis not present

## 2012-11-30 DIAGNOSIS — I4891 Unspecified atrial fibrillation: Secondary | ICD-10-CM | POA: Diagnosis not present

## 2012-11-30 DIAGNOSIS — Z794 Long term (current) use of insulin: Secondary | ICD-10-CM | POA: Diagnosis not present

## 2012-11-30 DIAGNOSIS — I129 Hypertensive chronic kidney disease with stage 1 through stage 4 chronic kidney disease, or unspecified chronic kidney disease: Secondary | ICD-10-CM | POA: Diagnosis not present

## 2012-11-30 DIAGNOSIS — N189 Chronic kidney disease, unspecified: Secondary | ICD-10-CM | POA: Diagnosis not present

## 2012-11-30 DIAGNOSIS — E119 Type 2 diabetes mellitus without complications: Secondary | ICD-10-CM | POA: Diagnosis not present

## 2012-12-04 DIAGNOSIS — Z794 Long term (current) use of insulin: Secondary | ICD-10-CM | POA: Diagnosis not present

## 2012-12-04 DIAGNOSIS — E119 Type 2 diabetes mellitus without complications: Secondary | ICD-10-CM | POA: Diagnosis not present

## 2012-12-04 DIAGNOSIS — I129 Hypertensive chronic kidney disease with stage 1 through stage 4 chronic kidney disease, or unspecified chronic kidney disease: Secondary | ICD-10-CM | POA: Diagnosis not present

## 2012-12-04 DIAGNOSIS — N189 Chronic kidney disease, unspecified: Secondary | ICD-10-CM | POA: Diagnosis not present

## 2012-12-04 DIAGNOSIS — I509 Heart failure, unspecified: Secondary | ICD-10-CM | POA: Diagnosis not present

## 2012-12-04 DIAGNOSIS — I4891 Unspecified atrial fibrillation: Secondary | ICD-10-CM | POA: Diagnosis not present

## 2012-12-05 DIAGNOSIS — I4891 Unspecified atrial fibrillation: Secondary | ICD-10-CM | POA: Diagnosis not present

## 2012-12-05 DIAGNOSIS — Z7901 Long term (current) use of anticoagulants: Secondary | ICD-10-CM | POA: Diagnosis not present

## 2012-12-06 ENCOUNTER — Encounter (HOSPITAL_COMMUNITY)
Admission: RE | Admit: 2012-12-06 | Discharge: 2012-12-06 | Disposition: A | Discharge status: Home / Self Care | Payer: Medicare Other | Source: Ambulatory Visit | Attending: Nephrology | Admitting: Nephrology

## 2012-12-06 DIAGNOSIS — D649 Anemia, unspecified: Secondary | ICD-10-CM | POA: Diagnosis not present

## 2012-12-06 DIAGNOSIS — E039 Hypothyroidism, unspecified: Secondary | ICD-10-CM | POA: Diagnosis not present

## 2012-12-06 DIAGNOSIS — I509 Heart failure, unspecified: Secondary | ICD-10-CM | POA: Diagnosis not present

## 2012-12-06 DIAGNOSIS — N184 Chronic kidney disease, stage 4 (severe): Secondary | ICD-10-CM | POA: Diagnosis not present

## 2012-12-06 DIAGNOSIS — I4891 Unspecified atrial fibrillation: Secondary | ICD-10-CM | POA: Diagnosis not present

## 2012-12-06 LAB — POCT HEMOGLOBIN-HEMACUE: Hemoglobin: 12.2 g/dL (ref 12.0–15.0)

## 2012-12-06 MED ORDER — EPOETIN ALFA 10000 UNIT/ML IJ SOLN: 10000.0000 [IU] | INTRAMUSCULAR | Status: DC

## 2012-12-08 DIAGNOSIS — N184 Chronic kidney disease, stage 4 (severe): Secondary | ICD-10-CM | POA: Diagnosis not present

## 2012-12-08 DIAGNOSIS — Z794 Long term (current) use of insulin: Secondary | ICD-10-CM | POA: Diagnosis not present

## 2012-12-08 DIAGNOSIS — E119 Type 2 diabetes mellitus without complications: Secondary | ICD-10-CM | POA: Diagnosis not present

## 2012-12-08 DIAGNOSIS — D649 Anemia, unspecified: Secondary | ICD-10-CM | POA: Diagnosis not present

## 2012-12-08 DIAGNOSIS — I4891 Unspecified atrial fibrillation: Secondary | ICD-10-CM | POA: Diagnosis not present

## 2012-12-08 DIAGNOSIS — Z87440 Personal history of urinary (tract) infections: Secondary | ICD-10-CM | POA: Diagnosis not present

## 2012-12-08 DIAGNOSIS — I5043 Acute on chronic combined systolic (congestive) and diastolic (congestive) heart failure: Secondary | ICD-10-CM | POA: Diagnosis not present

## 2012-12-08 DIAGNOSIS — I509 Heart failure, unspecified: Secondary | ICD-10-CM | POA: Diagnosis not present

## 2012-12-08 DIAGNOSIS — I129 Hypertensive chronic kidney disease with stage 1 through stage 4 chronic kidney disease, or unspecified chronic kidney disease: Secondary | ICD-10-CM | POA: Diagnosis not present

## 2012-12-11 ENCOUNTER — Encounter (HOSPITAL_BASED_OUTPATIENT_CLINIC_OR_DEPARTMENT_OTHER): Payer: Self-pay | Admitting: *Deleted

## 2012-12-11 ENCOUNTER — Emergency Department (HOSPITAL_BASED_OUTPATIENT_CLINIC_OR_DEPARTMENT_OTHER): Payer: Medicare Other

## 2012-12-11 ENCOUNTER — Emergency Department (HOSPITAL_BASED_OUTPATIENT_CLINIC_OR_DEPARTMENT_OTHER)
Admission: EM | Admit: 2012-12-11 | Discharge: 2012-12-11 | Disposition: A | Discharge status: Home / Self Care | Payer: Medicare Other | Attending: Emergency Medicine | Admitting: Emergency Medicine

## 2012-12-11 DIAGNOSIS — E119 Type 2 diabetes mellitus without complications: Secondary | ICD-10-CM | POA: Insufficient documentation

## 2012-12-11 DIAGNOSIS — I509 Heart failure, unspecified: Secondary | ICD-10-CM | POA: Insufficient documentation

## 2012-12-11 DIAGNOSIS — Z862 Personal history of diseases of the blood and blood-forming organs and certain disorders involving the immune mechanism: Secondary | ICD-10-CM | POA: Insufficient documentation

## 2012-12-11 DIAGNOSIS — M79609 Pain in unspecified limb: Secondary | ICD-10-CM | POA: Diagnosis not present

## 2012-12-11 DIAGNOSIS — Z87448 Personal history of other diseases of urinary system: Secondary | ICD-10-CM | POA: Insufficient documentation

## 2012-12-11 DIAGNOSIS — D649 Anemia, unspecified: Secondary | ICD-10-CM | POA: Insufficient documentation

## 2012-12-11 DIAGNOSIS — Z794 Long term (current) use of insulin: Secondary | ICD-10-CM | POA: Diagnosis not present

## 2012-12-11 DIAGNOSIS — R079 Chest pain, unspecified: Secondary | ICD-10-CM | POA: Diagnosis not present

## 2012-12-11 DIAGNOSIS — E079 Disorder of thyroid, unspecified: Secondary | ICD-10-CM | POA: Insufficient documentation

## 2012-12-11 DIAGNOSIS — Z79899 Other long term (current) drug therapy: Secondary | ICD-10-CM | POA: Insufficient documentation

## 2012-12-11 DIAGNOSIS — I10 Essential (primary) hypertension: Secondary | ICD-10-CM | POA: Insufficient documentation

## 2012-12-11 DIAGNOSIS — M79602 Pain in left arm: Secondary | ICD-10-CM

## 2012-12-11 DIAGNOSIS — I4891 Unspecified atrial fibrillation: Secondary | ICD-10-CM | POA: Diagnosis not present

## 2012-12-11 LAB — CBC
HCT: 35.3 % — ABNORMAL LOW (ref 36.0–46.0)
Hemoglobin: 12 g/dL (ref 12.0–15.0)
MCV: 92.9 fL (ref 78.0–100.0)
RBC: 3.8 MIL/uL — ABNORMAL LOW (ref 3.87–5.11)
RDW: 17.4 % — ABNORMAL HIGH (ref 11.5–15.5)
WBC: 5.9 10*3/uL (ref 4.0–10.5)

## 2012-12-11 LAB — TROPONIN I
Troponin I: <0.3 ng/mL (ref <0.30)
Troponin I: <0.3 ng/mL (ref <0.30)

## 2012-12-11 LAB — BASIC METABOLIC PANEL
BUN: 18 mg/dL (ref 6–23)
CO2: 31 mEq/L (ref 19–32)
Chloride: 96 mEq/L (ref 96–112)
Creatinine, Ser: 1.5 mg/dL — ABNORMAL HIGH (ref 0.50–1.10)
GFR calc Af Amer: 35 mL/min — ABNORMAL LOW (ref >90)
Potassium: 3.4 mEq/L — ABNORMAL LOW (ref 3.5–5.1)

## 2012-12-11 LAB — PROTIME-INR: INR: 1.94 — ABNORMAL HIGH (ref 0.00–1.49)

## 2012-12-11 MED ORDER — ASPIRIN 325 MG PO TABS
325.0000 mg | ORAL_TABLET | Freq: Once | ORAL | Status: AC
  Administered 2012-12-11: 325 mg via ORAL
  Filled 2012-12-11: qty 1

## 2012-12-11 MED ORDER — MORPHINE SULFATE 4 MG/ML IJ SOLN
4.0000 mg | Freq: Once | INTRAMUSCULAR | Status: AC
  Administered 2012-12-11: 4 mg via INTRAVENOUS
  Filled 2012-12-11: qty 1

## 2012-12-11 NOTE — ED Notes (Signed)
Left arm pain since yesterday. She had pain under her left breast that felt like gas last week.

## 2012-12-11 NOTE — ED Notes (Signed)
Pt noted to have +2 pitting edema to bilateral le. Pt states she was admitted for chf around 7/7 and has been taking 120mg  of lasix twice daily.

## 2012-12-11 NOTE — ED Provider Notes (Signed)
CSN: [id number]     Arrival date & time 12/11/12  1839 History     First MD Initiated Contact with Patient 12/11/12 1900     Chief Complaint  Patient presents with  . Arm Pain    The history is provided by the patient and a relative.   Patient reports worsening left arm pain over the past 24 hours.  Last week she had some pain under her left breast.  His history of atrial fibrillation as well as congestive heart failure.  She does have a several pound weight gain over the past week.  She denies shortness of breath or orthopnea.  No fevers or chills.  No cough or congestion.  Her home health aide came out this evening he recommended that she come emergency apartment for evaluation given her left arm pain.  She denies numbness or weakness of her left upper extremity.  No rash.  Her pain is worsened by movement and palpation of her left arm.  There is no focal area that seems to bother her the most.   Past Medical History  Diagnosis Date  . Hypertension   . Diabetes mellitus   . CHF (congestive heart failure)   . Thyroid disease   . Atrial fibrillation   . Kidney failure   . Anemia 11/07/2012   Past Surgical History  Procedure Laterality Date  . Abdominal hysterectomy    . Thyroid surgery     No family history on file. History  Substance Use Topics  . Smoking status: Never Smoker   . Smokeless tobacco: Never Used  . Alcohol Use: No   OB History   Grav Para Term Preterm Abortions TAB SAB Ect Mult Living                 Review of Systems  All other systems reviewed and are negative.    Allergies  Review of patient's allergies indicates no known allergies.  Home Medications   Current Outpatient Rx  Name  Route  Sig  Dispense  Refill  . metolazone (ZAROXOLYN) 2.5 MG tablet   Oral   Take 2.5 mg by mouth as needed.         Marland Kitchen amLODipine (NORVASC) 10 MG tablet   Oral   Take 10 mg by mouth daily.         . carvedilol (COREG) 6.25 MG tablet   Oral   Take 6.25 mg by  mouth 2 (two) times daily with a meal.         . cholecalciferol (VITAMIN D) 1000 UNITS tablet   Oral   Take 1,000 Units by mouth daily.         Marland Kitchen docusate sodium 100 MG CAPS   Oral   Take 100 mg by mouth 2 (two) times daily. OTC   10 capsule   0   . ferrous sulfate 325 (65 FE) MG tablet   Oral   Take 1 tablet (325 mg total) by mouth 3 (three) times daily with meals.         . furosemide (LASIX) 40 MG tablet   Oral   Take 3 tablets (120 mg total) by mouth 2 (two) times daily.   180 tablet   0   . glimepiride (AMARYL) 4 MG tablet   Oral   Take 4 mg by mouth 2 (two) times daily.         . insulin glargine (LANTUS) 100 UNIT/ML injection   Subcutaneous   Inject 25  Units into the skin at bedtime.          . isosorbide mononitrate (IMDUR) 30 MG 24 hr tablet   Oral   Take 30 mg by mouth daily.         Marland Kitchen levothyroxine (SYNTHROID, LEVOTHROID) 137 MCG tablet   Oral   Take 137-274 mcg by mouth daily before breakfast. Takes 2 tablets on Sunday, takes 1 tablet on all remaining days.         . nitroGLYCERIN (NITROSTAT) 0.4 MG SL tablet   Sublingual   Place 0.4 mg under the tongue every 5 (five) minutes as needed for chest pain.         Marland Kitchen nystatin-triamcinolone (MYCOLOG II) cream   Topical   Apply 1 application topically 2 (two) times daily.         . pantoprazole (PROTONIX) 40 MG tablet   Oral   Take 40 mg by mouth 2 (two) times daily.         . polyethylene glycol (MIRALAX / GLYCOLAX) packet   Oral   Take 17 g by mouth daily as needed. otc   14 each   0   . potassium chloride SA (K-DUR,KLOR-CON) 20 MEQ tablet   Oral   Take 2 tablets (40 mEq total) by mouth 2 (two) times daily.   120 tablet   0   . pravastatin (PRAVACHOL) 40 MG tablet   Oral   Take 40 mg by mouth daily.         Marland Kitchen senna (SENOKOT) 8.6 MG TABS   Oral   Take 2 tablets (17.2 mg total) by mouth daily. otc   120 each   0   . silver sulfADIAZINE (SILVADENE) 1 % cream    Topical   Apply 1 application topically daily.         . sitaGLIPtin (JANUVIA) 50 MG tablet   Oral   Take 25 mg by mouth daily.         . traMADol (ULTRAM) 50 MG tablet   Oral   Take 1 tablet (50 mg total) by mouth every 6 (six) hours as needed for pain.   30 tablet   0    BP 132/40  Pulse 58  Temp(Src) 98.6 F (37 C) (Oral)  Resp 18  Ht 5\' 2"  (1.575 m)  Wt 201 lb (91.173 kg)  BMI 36.75 kg/m2  SpO2 95% Physical Exam  Nursing note and vitals reviewed. Constitutional: She is oriented to person, place, and time. She appears well-developed and well-nourished. No distress.  HENT:  Head: Normocephalic and atraumatic.  Eyes: EOM are normal.  Neck: Normal range of motion.  No cervical spine tenderness.  No trapezius tenderness.  Cardiovascular: Normal rate, regular rhythm and normal heart sounds.   Pulmonary/Chest: Effort normal and breath sounds normal.  Abdominal: Soft. She exhibits no distension. There is no tenderness.  Musculoskeletal: Normal range of motion.  Normal left radial pulse.  Full range of motion of left wrist left elbow and left shoulder.  No obvious deformity.  No rash noted of her left upper extremity.  Neurological: She is alert and oriented to person, place, and time.  Skin: Skin is warm and dry.  Psychiatric: She has a normal mood and affect. Judgment normal.    ED Course   Procedures (including critical care time)   Date: 12/11/2012  Rate: 63  Rhythm: Atrial fibrillation  QRS Axis: normal  Intervals: normal  ST/T Wave abnormalities: normal  Conduction Disutrbances: none  Narrative Interpretation:   Old EKG Reviewed: No significant changes noted     Labs Reviewed  CBC - Abnormal; Notable for the following:    RBC 3.80 (*)    HCT 35.3 (*)    RDW 17.4 (*)    All other components within normal limits  BASIC METABOLIC PANEL - Abnormal; Notable for the following:    Potassium 3.4 (*)    Glucose, Bld 163 (*)    Creatinine, Ser 1.50 (*)     GFR calc non Af Amer 31 (*)    GFR calc Af Amer 35 (*)    All other components within normal limits  PROTIME-INR - Abnormal; Notable for the following:    Prothrombin Time 21.6 (*)    INR 1.94 (*)    All other components within normal limits  TROPONIN I  TROPONIN I   Dg Chest 2 View  12/11/2012   *RADIOLOGY REPORT*  Clinical Data: Left arm pain.  The patient has a history of congestive heart failure.  CHEST - 2 VIEW  Comparison: November 06, 2012  Findings: There is no focal infiltrate, pulmonary edema, or pleural effusion.  The mediastinal contour is normal.  The heart size is mildly enlarged.  The soft tissues and osseous structures are stable.  There are degenerative joint changes of the left first rib manubrial joint.  IMPRESSION: No acute cardiopulmonary disease identified.   Original Report Authenticated By: Abelardo Diesel, M.D.  I personally reviewed the imaging tests through PACS system I reviewed available ER/hospitalization records through the EMR   1. Left arm pain     MDM  Her pain seems to be musculoskeletal but given her age and her risk factors the patient was managed in the emergency department conservatively.  Her EKG is.acute changes.  Troponin x2 is negative.  Doubt ACS.  Doubt heart failure.  Doubt dissection.  Doubt upper extremity DVT.  Pain resolved with syllabus morphine in the emergency department.  Has pain medicine at home.  Close PCP followup.  Hoy Morn, MD 12/11/12 2217

## 2012-12-12 DIAGNOSIS — E876 Hypokalemia: Secondary | ICD-10-CM | POA: Diagnosis not present

## 2012-12-12 DIAGNOSIS — L989 Disorder of the skin and subcutaneous tissue, unspecified: Secondary | ICD-10-CM | POA: Diagnosis not present

## 2012-12-12 DIAGNOSIS — I509 Heart failure, unspecified: Secondary | ICD-10-CM | POA: Diagnosis not present

## 2012-12-12 DIAGNOSIS — N184 Chronic kidney disease, stage 4 (severe): Secondary | ICD-10-CM | POA: Diagnosis not present

## 2012-12-12 DIAGNOSIS — N390 Urinary tract infection, site not specified: Secondary | ICD-10-CM | POA: Diagnosis not present

## 2012-12-20 ENCOUNTER — Encounter (HOSPITAL_COMMUNITY)
Admission: RE | Admit: 2012-12-20 | Discharge: 2012-12-20 | Disposition: A | Discharge status: Home / Self Care | Payer: Medicare Other | Source: Ambulatory Visit | Attending: Nephrology | Admitting: Nephrology

## 2012-12-20 DIAGNOSIS — D649 Anemia, unspecified: Secondary | ICD-10-CM | POA: Diagnosis not present

## 2012-12-20 DIAGNOSIS — I509 Heart failure, unspecified: Secondary | ICD-10-CM | POA: Diagnosis not present

## 2012-12-20 DIAGNOSIS — I4891 Unspecified atrial fibrillation: Secondary | ICD-10-CM | POA: Diagnosis not present

## 2012-12-20 DIAGNOSIS — N184 Chronic kidney disease, stage 4 (severe): Secondary | ICD-10-CM | POA: Insufficient documentation

## 2012-12-20 DIAGNOSIS — E039 Hypothyroidism, unspecified: Secondary | ICD-10-CM | POA: Insufficient documentation

## 2012-12-20 LAB — IRON AND TIBC
Saturation Ratios: 42 % (ref 20–55)
TIBC: 203 ug/dL — ABNORMAL LOW (ref 250–470)

## 2012-12-20 MED ORDER — EPOETIN ALFA 10000 UNIT/ML IJ SOLN
INTRAMUSCULAR | Status: AC
  Administered 2012-12-20: 10000 [IU] via SUBCUTANEOUS
  Filled 2012-12-20: qty 1

## 2012-12-20 MED ORDER — EPOETIN ALFA 10000 UNIT/ML IJ SOLN: 10000.0000 [IU] | INTRAMUSCULAR | Status: DC

## 2012-12-27 ENCOUNTER — Encounter (HOSPITAL_COMMUNITY)
Admission: RE | Admit: 2012-12-27 | Discharge: 2012-12-27 | Disposition: A | Discharge status: Home / Self Care | Payer: Medicare Other | Source: Ambulatory Visit | Attending: Nephrology | Admitting: Nephrology

## 2012-12-27 DIAGNOSIS — I4891 Unspecified atrial fibrillation: Secondary | ICD-10-CM | POA: Diagnosis not present

## 2012-12-27 DIAGNOSIS — E039 Hypothyroidism, unspecified: Secondary | ICD-10-CM | POA: Diagnosis not present

## 2012-12-27 DIAGNOSIS — N184 Chronic kidney disease, stage 4 (severe): Secondary | ICD-10-CM | POA: Diagnosis not present

## 2012-12-27 DIAGNOSIS — D649 Anemia, unspecified: Secondary | ICD-10-CM | POA: Diagnosis not present

## 2012-12-27 DIAGNOSIS — I509 Heart failure, unspecified: Secondary | ICD-10-CM | POA: Diagnosis not present

## 2012-12-27 MED ORDER — EPOETIN ALFA 10000 UNIT/ML IJ SOLN
10000.0000 [IU] | INTRAMUSCULAR | Status: DC
  Administered 2012-12-27: 10000 [IU] via SUBCUTANEOUS

## 2012-12-27 MED ORDER — EPOETIN ALFA 10000 UNIT/ML IJ SOLN
INTRAMUSCULAR | Status: AC
  Filled 2012-12-27: qty 1

## 2013-01-04 ENCOUNTER — Encounter (HOSPITAL_COMMUNITY)
Admission: RE | Admit: 2013-01-04 | Discharge: 2013-01-04 | Disposition: A | Discharge status: Home / Self Care | Payer: Medicare Other | Source: Ambulatory Visit | Attending: Nephrology | Admitting: Nephrology

## 2013-01-04 DIAGNOSIS — N184 Chronic kidney disease, stage 4 (severe): Secondary | ICD-10-CM | POA: Diagnosis not present

## 2013-01-04 DIAGNOSIS — D649 Anemia, unspecified: Secondary | ICD-10-CM | POA: Diagnosis not present

## 2013-01-04 DIAGNOSIS — E039 Hypothyroidism, unspecified: Secondary | ICD-10-CM | POA: Diagnosis not present

## 2013-01-04 DIAGNOSIS — I509 Heart failure, unspecified: Secondary | ICD-10-CM | POA: Diagnosis not present

## 2013-01-04 DIAGNOSIS — I4891 Unspecified atrial fibrillation: Secondary | ICD-10-CM | POA: Diagnosis not present

## 2013-01-04 MED ORDER — EPOETIN ALFA 10000 UNIT/ML IJ SOLN
10000.0000 [IU] | INTRAMUSCULAR | Status: DC
  Administered 2013-01-04: 10000 [IU] via SUBCUTANEOUS

## 2013-01-04 MED ORDER — EPOETIN ALFA 10000 UNIT/ML IJ SOLN
INTRAMUSCULAR | Status: AC
  Filled 2013-01-04: qty 1

## 2013-01-11 ENCOUNTER — Encounter (HOSPITAL_COMMUNITY)
Admission: RE | Admit: 2013-01-11 | Discharge: 2013-01-11 | Disposition: A | Discharge status: Home / Self Care | Payer: Medicare Other | Source: Ambulatory Visit | Attending: Nephrology | Admitting: Nephrology

## 2013-01-11 DIAGNOSIS — I4891 Unspecified atrial fibrillation: Secondary | ICD-10-CM | POA: Insufficient documentation

## 2013-01-11 DIAGNOSIS — N184 Chronic kidney disease, stage 4 (severe): Secondary | ICD-10-CM | POA: Insufficient documentation

## 2013-01-11 DIAGNOSIS — I509 Heart failure, unspecified: Secondary | ICD-10-CM | POA: Insufficient documentation

## 2013-01-11 DIAGNOSIS — E039 Hypothyroidism, unspecified: Secondary | ICD-10-CM | POA: Diagnosis not present

## 2013-01-11 DIAGNOSIS — D649 Anemia, unspecified: Secondary | ICD-10-CM | POA: Diagnosis not present

## 2013-01-11 LAB — POCT HEMOGLOBIN-HEMACUE: Hemoglobin: 11.9 g/dL — ABNORMAL LOW (ref 12.0–15.0)

## 2013-01-11 MED ORDER — EPOETIN ALFA 10000 UNIT/ML IJ SOLN
INTRAMUSCULAR | Status: AC
  Administered 2013-01-11: 10000 [IU] via SUBCUTANEOUS
  Filled 2013-01-11: qty 1

## 2013-01-11 MED ORDER — EPOETIN ALFA 10000 UNIT/ML IJ SOLN: 10000.0000 [IU] | INTRAMUSCULAR | Status: DC

## 2013-01-17 ENCOUNTER — Other Ambulatory Visit (HOSPITAL_COMMUNITY): Payer: Self-pay | Admitting: *Deleted

## 2013-01-18 ENCOUNTER — Encounter (HOSPITAL_COMMUNITY)
Admission: RE | Admit: 2013-01-18 | Discharge: 2013-01-18 | Disposition: A | Discharge status: Home / Self Care | Payer: Medicare Other | Source: Ambulatory Visit | Attending: Nephrology | Admitting: Nephrology

## 2013-01-18 LAB — POCT HEMOGLOBIN-HEMACUE: Hemoglobin: 12.3 g/dL (ref 12.0–15.0)

## 2013-01-18 LAB — IRON AND TIBC
Iron: 69 ug/dL (ref 42–135)
UIBC: 154 ug/dL (ref 125–400)

## 2013-01-18 LAB — FERRITIN: Ferritin: 450 ng/mL — ABNORMAL HIGH (ref 10–291)

## 2013-01-18 MED ORDER — EPOETIN ALFA 10000 UNIT/ML IJ SOLN: 10000.0000 [IU] | INTRAMUSCULAR | Status: DC

## 2013-01-25 DIAGNOSIS — H2589 Other age-related cataract: Secondary | ICD-10-CM | POA: Diagnosis not present

## 2013-01-28 DIAGNOSIS — Z7901 Long term (current) use of anticoagulants: Secondary | ICD-10-CM | POA: Diagnosis not present

## 2013-01-28 DIAGNOSIS — I4891 Unspecified atrial fibrillation: Secondary | ICD-10-CM | POA: Diagnosis not present

## 2013-01-31 DIAGNOSIS — I4891 Unspecified atrial fibrillation: Secondary | ICD-10-CM | POA: Diagnosis not present

## 2013-01-31 DIAGNOSIS — Z7901 Long term (current) use of anticoagulants: Secondary | ICD-10-CM | POA: Diagnosis not present

## 2013-01-31 DIAGNOSIS — I5032 Chronic diastolic (congestive) heart failure: Secondary | ICD-10-CM | POA: Diagnosis not present

## 2013-01-31 DIAGNOSIS — N184 Chronic kidney disease, stage 4 (severe): Secondary | ICD-10-CM | POA: Diagnosis not present

## 2013-02-01 ENCOUNTER — Encounter (HOSPITAL_COMMUNITY)
Admission: RE | Admit: 2013-02-01 | Discharge: 2013-02-01 | Disposition: A | Discharge status: Home / Self Care | Payer: Medicare Other | Source: Ambulatory Visit | Attending: Nephrology | Admitting: Nephrology

## 2013-02-01 LAB — POCT HEMOGLOBIN-HEMACUE: Hemoglobin: 11.9 g/dL — ABNORMAL LOW (ref 12.0–15.0)

## 2013-02-01 MED ORDER — EPOETIN ALFA 10000 UNIT/ML IJ SOLN
10000.0000 [IU] | INTRAMUSCULAR | Status: DC
  Administered 2013-02-01: 11:00:00 10000 [IU] via SUBCUTANEOUS

## 2013-02-01 MED ORDER — EPOETIN ALFA 10000 UNIT/ML IJ SOLN
INTRAMUSCULAR | Status: AC
  Filled 2013-02-01: qty 1

## 2013-02-08 ENCOUNTER — Encounter (HOSPITAL_COMMUNITY)
Admission: RE | Admit: 2013-02-08 | Discharge: 2013-02-08 | Disposition: A | Discharge status: Home / Self Care | Payer: Medicare Other | Source: Ambulatory Visit | Attending: Nephrology | Admitting: Nephrology

## 2013-02-08 DIAGNOSIS — I4891 Unspecified atrial fibrillation: Secondary | ICD-10-CM | POA: Insufficient documentation

## 2013-02-08 DIAGNOSIS — N184 Chronic kidney disease, stage 4 (severe): Secondary | ICD-10-CM | POA: Diagnosis not present

## 2013-02-08 DIAGNOSIS — E039 Hypothyroidism, unspecified: Secondary | ICD-10-CM | POA: Diagnosis not present

## 2013-02-08 DIAGNOSIS — I509 Heart failure, unspecified: Secondary | ICD-10-CM | POA: Insufficient documentation

## 2013-02-08 DIAGNOSIS — D649 Anemia, unspecified: Secondary | ICD-10-CM | POA: Diagnosis not present

## 2013-02-08 LAB — POCT HEMOGLOBIN-HEMACUE: Hemoglobin: 11.2 g/dL — ABNORMAL LOW (ref 12.0–15.0)

## 2013-02-08 MED ORDER — EPOETIN ALFA 10000 UNIT/ML IJ SOLN
10000.0000 [IU] | INTRAMUSCULAR | Status: DC
  Administered 2013-02-08: 13:00:00 10000 [IU] via SUBCUTANEOUS

## 2013-02-08 MED ORDER — EPOETIN ALFA 10000 UNIT/ML IJ SOLN
INTRAMUSCULAR | Status: AC
  Filled 2013-02-08: qty 1

## 2013-02-11 ENCOUNTER — Telehealth: Payer: Self-pay | Admitting: Cardiology

## 2013-02-11 LAB — POCT INR: INR: 2.1

## 2013-02-11 NOTE — Telephone Encounter (Signed)
Spoke with patient advised that the we have the results just waiting on the Doctor to read the results. Will call as soon as I have the results.

## 2013-02-11 NOTE — Telephone Encounter (Signed)
New Problem  Pt requesting blood work results.

## 2013-02-12 ENCOUNTER — Ambulatory Visit (INDEPENDENT_AMBULATORY_CARE_PROVIDER_SITE_OTHER): Payer: PRIVATE HEALTH INSURANCE | Admitting: Pharmacist

## 2013-02-12 DIAGNOSIS — I4891 Unspecified atrial fibrillation: Secondary | ICD-10-CM

## 2013-02-14 DIAGNOSIS — I259 Chronic ischemic heart disease, unspecified: Secondary | ICD-10-CM | POA: Diagnosis not present

## 2013-02-14 DIAGNOSIS — Z23 Encounter for immunization: Secondary | ICD-10-CM | POA: Diagnosis not present

## 2013-02-14 DIAGNOSIS — N184 Chronic kidney disease, stage 4 (severe): Secondary | ICD-10-CM | POA: Diagnosis not present

## 2013-02-14 DIAGNOSIS — E1129 Type 2 diabetes mellitus with other diabetic kidney complication: Secondary | ICD-10-CM | POA: Diagnosis not present

## 2013-02-15 ENCOUNTER — Encounter (HOSPITAL_COMMUNITY)
Admission: RE | Admit: 2013-02-15 | Discharge: 2013-02-15 | Disposition: A | Discharge status: Home / Self Care | Payer: Medicare Other | Source: Ambulatory Visit | Attending: Nephrology | Admitting: Nephrology

## 2013-02-15 LAB — IRON AND TIBC: Saturation Ratios: 27 % (ref 20–55)

## 2013-02-15 LAB — POCT HEMOGLOBIN-HEMACUE: Hemoglobin: 11.9 g/dL — ABNORMAL LOW (ref 12.0–15.0)

## 2013-02-15 MED ORDER — EPOETIN ALFA 10000 UNIT/ML IJ SOLN
10000.0000 [IU] | INTRAMUSCULAR | Status: DC
  Administered 2013-02-15: 10:00:00 10000 [IU] via SUBCUTANEOUS

## 2013-02-15 MED ORDER — EPOETIN ALFA 10000 UNIT/ML IJ SOLN
INTRAMUSCULAR | Status: AC
  Filled 2013-02-15: qty 1

## 2013-02-20 DIAGNOSIS — I259 Chronic ischemic heart disease, unspecified: Secondary | ICD-10-CM | POA: Diagnosis not present

## 2013-02-22 ENCOUNTER — Encounter (HOSPITAL_COMMUNITY)
Admission: RE | Admit: 2013-02-22 | Discharge: 2013-02-22 | Disposition: A | Discharge status: Home / Self Care | Payer: Medicare Other | Source: Ambulatory Visit | Attending: Nephrology | Admitting: Nephrology

## 2013-02-22 MED ORDER — EPOETIN ALFA 10000 UNIT/ML IJ SOLN: 10000.0000 [IU] | INTRAMUSCULAR | Status: DC

## 2013-02-24 ENCOUNTER — Other Ambulatory Visit: Payer: Self-pay | Admitting: Cardiology

## 2013-02-25 ENCOUNTER — Ambulatory Visit (INDEPENDENT_AMBULATORY_CARE_PROVIDER_SITE_OTHER): Payer: PRIVATE HEALTH INSURANCE | Admitting: Pharmacist

## 2013-02-25 DIAGNOSIS — I4891 Unspecified atrial fibrillation: Secondary | ICD-10-CM

## 2013-02-25 LAB — POCT INR: INR: 2.3

## 2013-02-27 ENCOUNTER — Other Ambulatory Visit: Payer: Self-pay | Admitting: Cardiology

## 2013-03-04 ENCOUNTER — Telehealth: Payer: Self-pay | Admitting: Cardiology

## 2013-03-04 NOTE — Telephone Encounter (Signed)
New problem:  Pt's daughter states the pt takes one pill every Tuesday to prevent weight gain. Pt's daughter states she hasn't gained wt and would like to know if her mom needs the pill or not. Please advise

## 2013-03-04 NOTE — Telephone Encounter (Signed)
Wt 197 lbs -  Advised pt needs to take metolazone.  Daughter states understanding

## 2013-03-06 DIAGNOSIS — R300 Dysuria: Secondary | ICD-10-CM | POA: Diagnosis not present

## 2013-03-08 ENCOUNTER — Encounter (HOSPITAL_COMMUNITY)
Admission: RE | Admit: 2013-03-08 | Discharge: 2013-03-08 | Disposition: A | Discharge status: Home / Self Care | Payer: Medicare Other | Source: Ambulatory Visit | Attending: Nephrology | Admitting: Nephrology

## 2013-03-08 LAB — POCT HEMOGLOBIN-HEMACUE: Hemoglobin: 12 g/dL (ref 12.0–15.0)

## 2013-03-08 MED ORDER — EPOETIN ALFA 10000 UNIT/ML IJ SOLN: 10000.0000 [IU] | INTRAMUSCULAR | Status: DC

## 2013-03-11 ENCOUNTER — Telehealth: Payer: Self-pay | Admitting: Cardiology

## 2013-03-11 LAB — POCT INR: INR: 2.3

## 2013-03-11 NOTE — Telephone Encounter (Signed)
Patient just finished a course of keflex 500 mg bid for UTI, and will start Cipro 250 mg bid (x 5 days) today for UTI.  Will check INR at home today, and once I get result, will make warfarin dose adjustment accordingly.

## 2013-03-11 NOTE — Telephone Encounter (Signed)
New problem,   Pt is being but on Cipro by PCP for a UTI .  Pt's daughter will check her  coumadin today before she starts it.  She was told pt's coumadin may go up/ is it ok for pt to take this med.   They need a call back please.

## 2013-03-12 ENCOUNTER — Ambulatory Visit (INDEPENDENT_AMBULATORY_CARE_PROVIDER_SITE_OTHER): Payer: PRIVATE HEALTH INSURANCE | Admitting: Cardiology

## 2013-03-12 DIAGNOSIS — I4891 Unspecified atrial fibrillation: Secondary | ICD-10-CM

## 2013-03-18 DIAGNOSIS — I4891 Unspecified atrial fibrillation: Secondary | ICD-10-CM | POA: Diagnosis not present

## 2013-03-18 DIAGNOSIS — Z7901 Long term (current) use of anticoagulants: Secondary | ICD-10-CM | POA: Diagnosis not present

## 2013-03-19 ENCOUNTER — Ambulatory Visit (INDEPENDENT_AMBULATORY_CARE_PROVIDER_SITE_OTHER): Payer: PRIVATE HEALTH INSURANCE | Admitting: Cardiology

## 2013-03-19 DIAGNOSIS — I4891 Unspecified atrial fibrillation: Secondary | ICD-10-CM

## 2013-03-22 ENCOUNTER — Encounter (HOSPITAL_COMMUNITY)
Admission: RE | Admit: 2013-03-22 | Discharge: 2013-03-22 | Disposition: A | Discharge status: Home / Self Care | Payer: Medicare Other | Source: Ambulatory Visit | Attending: Nephrology | Admitting: Nephrology

## 2013-03-22 DIAGNOSIS — D649 Anemia, unspecified: Secondary | ICD-10-CM | POA: Insufficient documentation

## 2013-03-22 DIAGNOSIS — I509 Heart failure, unspecified: Secondary | ICD-10-CM | POA: Diagnosis not present

## 2013-03-22 DIAGNOSIS — N184 Chronic kidney disease, stage 4 (severe): Secondary | ICD-10-CM | POA: Insufficient documentation

## 2013-03-22 DIAGNOSIS — E039 Hypothyroidism, unspecified: Secondary | ICD-10-CM | POA: Diagnosis not present

## 2013-03-22 DIAGNOSIS — I4891 Unspecified atrial fibrillation: Secondary | ICD-10-CM | POA: Diagnosis not present

## 2013-03-22 LAB — IRON AND TIBC
Iron: 56 ug/dL (ref 42–135)
Saturation Ratios: 27 % (ref 20–55)
TIBC: 206 ug/dL — ABNORMAL LOW (ref 250–470)

## 2013-03-22 LAB — FERRITIN: Ferritin: 427 ng/mL — ABNORMAL HIGH (ref 10–291)

## 2013-03-22 MED ORDER — EPOETIN ALFA 10000 UNIT/ML IJ SOLN: 10000.0000 [IU] | INTRAMUSCULAR | Status: DC

## 2013-04-01 ENCOUNTER — Ambulatory Visit (INDEPENDENT_AMBULATORY_CARE_PROVIDER_SITE_OTHER): Payer: PRIVATE HEALTH INSURANCE | Admitting: Pharmacist

## 2013-04-01 DIAGNOSIS — N2581 Secondary hyperparathyroidism of renal origin: Secondary | ICD-10-CM | POA: Diagnosis not present

## 2013-04-01 DIAGNOSIS — N184 Chronic kidney disease, stage 4 (severe): Secondary | ICD-10-CM | POA: Diagnosis not present

## 2013-04-01 DIAGNOSIS — I4891 Unspecified atrial fibrillation: Secondary | ICD-10-CM

## 2013-04-01 DIAGNOSIS — D631 Anemia in chronic kidney disease: Secondary | ICD-10-CM | POA: Diagnosis not present

## 2013-04-01 LAB — POCT INR: INR: 1.8

## 2013-04-05 ENCOUNTER — Encounter (HOSPITAL_COMMUNITY)
Admission: RE | Admit: 2013-04-05 | Discharge: 2013-04-05 | Disposition: A | Discharge status: Home / Self Care | Payer: Medicare Other | Source: Ambulatory Visit | Attending: Nephrology | Admitting: Nephrology

## 2013-04-05 DIAGNOSIS — N184 Chronic kidney disease, stage 4 (severe): Secondary | ICD-10-CM | POA: Diagnosis not present

## 2013-04-05 DIAGNOSIS — I4891 Unspecified atrial fibrillation: Secondary | ICD-10-CM | POA: Diagnosis not present

## 2013-04-05 DIAGNOSIS — I509 Heart failure, unspecified: Secondary | ICD-10-CM | POA: Diagnosis not present

## 2013-04-05 DIAGNOSIS — D649 Anemia, unspecified: Secondary | ICD-10-CM | POA: Diagnosis not present

## 2013-04-05 DIAGNOSIS — E039 Hypothyroidism, unspecified: Secondary | ICD-10-CM | POA: Diagnosis not present

## 2013-04-05 MED ORDER — EPOETIN ALFA 10000 UNIT/ML IJ SOLN
10000.0000 [IU] | INTRAMUSCULAR | Status: DC
  Administered 2013-04-05: 10000 [IU] via SUBCUTANEOUS

## 2013-04-05 MED ORDER — EPOETIN ALFA 10000 UNIT/ML IJ SOLN
INTRAMUSCULAR | Status: AC
  Administered 2013-04-05: 10000 [IU] via SUBCUTANEOUS
  Filled 2013-04-05: qty 1

## 2013-04-11 ENCOUNTER — Other Ambulatory Visit (HOSPITAL_COMMUNITY): Payer: Self-pay | Admitting: *Deleted

## 2013-04-12 ENCOUNTER — Encounter (HOSPITAL_COMMUNITY)
Admission: RE | Admit: 2013-04-12 | Discharge: 2013-04-12 | Disposition: A | Discharge status: Home / Self Care | Payer: Medicare Other | Source: Ambulatory Visit | Attending: Nephrology | Admitting: Nephrology

## 2013-04-12 DIAGNOSIS — N184 Chronic kidney disease, stage 4 (severe): Secondary | ICD-10-CM | POA: Diagnosis not present

## 2013-04-12 DIAGNOSIS — D649 Anemia, unspecified: Secondary | ICD-10-CM | POA: Insufficient documentation

## 2013-04-12 DIAGNOSIS — I4891 Unspecified atrial fibrillation: Secondary | ICD-10-CM | POA: Insufficient documentation

## 2013-04-12 DIAGNOSIS — I509 Heart failure, unspecified: Secondary | ICD-10-CM | POA: Diagnosis not present

## 2013-04-12 DIAGNOSIS — E039 Hypothyroidism, unspecified: Secondary | ICD-10-CM | POA: Diagnosis not present

## 2013-04-12 MED ORDER — EPOETIN ALFA 10000 UNIT/ML IJ SOLN
INTRAMUSCULAR | Status: AC
  Administered 2013-04-12: 10000 [IU] via SUBCUTANEOUS
  Filled 2013-04-12: qty 1

## 2013-04-12 MED ORDER — EPOETIN ALFA 10000 UNIT/ML IJ SOLN: 10000.0000 [IU] | INTRAMUSCULAR | Status: DC

## 2013-04-16 ENCOUNTER — Ambulatory Visit (INDEPENDENT_AMBULATORY_CARE_PROVIDER_SITE_OTHER): Payer: PRIVATE HEALTH INSURANCE | Admitting: Pharmacist

## 2013-04-16 DIAGNOSIS — I4891 Unspecified atrial fibrillation: Secondary | ICD-10-CM

## 2013-04-17 ENCOUNTER — Other Ambulatory Visit: Payer: Self-pay | Admitting: Pharmacist

## 2013-04-17 MED ORDER — WARFARIN SODIUM 3 MG PO TABS: ORAL_TABLET | ORAL | Status: DC

## 2013-04-19 ENCOUNTER — Encounter (HOSPITAL_COMMUNITY)
Admission: RE | Admit: 2013-04-19 | Discharge: 2013-04-19 | Disposition: A | Discharge status: Home / Self Care | Payer: Medicare Other | Source: Ambulatory Visit | Attending: Nephrology | Admitting: Nephrology

## 2013-04-19 DIAGNOSIS — E039 Hypothyroidism, unspecified: Secondary | ICD-10-CM | POA: Diagnosis not present

## 2013-04-19 DIAGNOSIS — I4891 Unspecified atrial fibrillation: Secondary | ICD-10-CM | POA: Diagnosis not present

## 2013-04-19 DIAGNOSIS — N184 Chronic kidney disease, stage 4 (severe): Secondary | ICD-10-CM | POA: Diagnosis not present

## 2013-04-19 DIAGNOSIS — D649 Anemia, unspecified: Secondary | ICD-10-CM | POA: Diagnosis not present

## 2013-04-19 DIAGNOSIS — I509 Heart failure, unspecified: Secondary | ICD-10-CM | POA: Diagnosis not present

## 2013-04-19 LAB — IRON AND TIBC: UIBC: 167 ug/dL (ref 125–400)

## 2013-04-19 LAB — FERRITIN: Ferritin: 337 ng/mL — ABNORMAL HIGH (ref 10–291)

## 2013-04-19 MED ORDER — EPOETIN ALFA 10000 UNIT/ML IJ SOLN
INTRAMUSCULAR | Status: AC
  Filled 2013-04-19: qty 1

## 2013-04-19 MED ORDER — EPOETIN ALFA 10000 UNIT/ML IJ SOLN
10000.0000 [IU] | INTRAMUSCULAR | Status: DC
  Administered 2013-04-19: 10000 [IU] via SUBCUTANEOUS

## 2013-04-23 ENCOUNTER — Ambulatory Visit (INDEPENDENT_AMBULATORY_CARE_PROVIDER_SITE_OTHER): Payer: PRIVATE HEALTH INSURANCE | Admitting: Pharmacist

## 2013-04-23 DIAGNOSIS — I4891 Unspecified atrial fibrillation: Secondary | ICD-10-CM

## 2013-04-23 LAB — POCT INR: INR: 2.1

## 2013-04-26 ENCOUNTER — Other Ambulatory Visit: Payer: Self-pay | Admitting: Cardiology

## 2013-04-26 ENCOUNTER — Encounter (HOSPITAL_COMMUNITY)
Admission: RE | Admit: 2013-04-26 | Discharge: 2013-04-26 | Disposition: A | Discharge status: Home / Self Care | Payer: Medicare Other | Source: Ambulatory Visit | Attending: Nephrology | Admitting: Nephrology

## 2013-04-26 DIAGNOSIS — N184 Chronic kidney disease, stage 4 (severe): Secondary | ICD-10-CM | POA: Diagnosis not present

## 2013-04-26 DIAGNOSIS — I509 Heart failure, unspecified: Secondary | ICD-10-CM | POA: Diagnosis not present

## 2013-04-26 DIAGNOSIS — J111 Influenza due to unidentified influenza virus with other respiratory manifestations: Secondary | ICD-10-CM | POA: Diagnosis not present

## 2013-04-26 DIAGNOSIS — I4891 Unspecified atrial fibrillation: Secondary | ICD-10-CM | POA: Diagnosis not present

## 2013-04-26 DIAGNOSIS — E039 Hypothyroidism, unspecified: Secondary | ICD-10-CM | POA: Diagnosis not present

## 2013-04-26 DIAGNOSIS — D649 Anemia, unspecified: Secondary | ICD-10-CM | POA: Diagnosis not present

## 2013-04-26 LAB — POCT HEMOGLOBIN-HEMACUE: Hemoglobin: 12 g/dL (ref 12.0–15.0)

## 2013-04-26 MED ORDER — EPOETIN ALFA 10000 UNIT/ML IJ SOLN: 10000.0000 [IU] | INTRAMUSCULAR | Status: DC

## 2013-04-30 ENCOUNTER — Telehealth: Payer: Self-pay | Admitting: Cardiology

## 2013-04-30 NOTE — Telephone Encounter (Signed)
New message     Pt is on coumadin.  Her PCP prescribed tamaflu and doxcycline 100mg  tab bid.  Is this OK to take with her coumadin?  Also, her blood sugar is 98.

## 2013-04-30 NOTE — Telephone Encounter (Signed)
Called and spoke with pt's daughter and she states was placed on Doxycycline 100mg  bid for 7 days and Tamiflu. The pt started Doxycycline yesterday and instructed to have her check INR on Friday 05/03/2013 and call in results of INR and the daughter states understanding.Pt is self testor.

## 2013-05-03 ENCOUNTER — Ambulatory Visit (INDEPENDENT_AMBULATORY_CARE_PROVIDER_SITE_OTHER): Payer: PRIVATE HEALTH INSURANCE | Admitting: Pharmacist

## 2013-05-03 ENCOUNTER — Telehealth: Payer: Self-pay | Admitting: Pharmacist

## 2013-05-03 DIAGNOSIS — I4891 Unspecified atrial fibrillation: Secondary | ICD-10-CM

## 2013-05-03 LAB — POCT INR: INR: 3.7

## 2013-05-03 NOTE — Telephone Encounter (Signed)
Daughter wanted to confirm warfarin dose of 6 mg qd except 9 mg Wednesday and Saturday after warfarin is held for the next 2 days.  I confirmed that this dose was correct.  She will recheck INR in 5 days at home.

## 2013-05-03 NOTE — Telephone Encounter (Signed)
New Problem:  Pt's daughter is calling stating she has questions about her moms meds and she'd like Ysidro Evert to call her back.

## 2013-05-08 ENCOUNTER — Ambulatory Visit (INDEPENDENT_AMBULATORY_CARE_PROVIDER_SITE_OTHER): Payer: PRIVATE HEALTH INSURANCE | Admitting: Pharmacist

## 2013-05-08 DIAGNOSIS — I4891 Unspecified atrial fibrillation: Secondary | ICD-10-CM

## 2013-05-08 LAB — POCT INR: INR: 2

## 2013-05-10 ENCOUNTER — Encounter (HOSPITAL_COMMUNITY)
Admission: RE | Admit: 2013-05-10 | Discharge: 2013-05-10 | Disposition: A | Discharge status: Home / Self Care | Payer: Medicare Other | Source: Ambulatory Visit | Attending: Nephrology | Admitting: Nephrology

## 2013-05-10 DIAGNOSIS — E039 Hypothyroidism, unspecified: Secondary | ICD-10-CM | POA: Insufficient documentation

## 2013-05-10 DIAGNOSIS — I4891 Unspecified atrial fibrillation: Secondary | ICD-10-CM | POA: Insufficient documentation

## 2013-05-10 DIAGNOSIS — D649 Anemia, unspecified: Secondary | ICD-10-CM | POA: Diagnosis not present

## 2013-05-10 DIAGNOSIS — R05 Cough: Secondary | ICD-10-CM | POA: Diagnosis not present

## 2013-05-10 DIAGNOSIS — I509 Heart failure, unspecified: Secondary | ICD-10-CM | POA: Insufficient documentation

## 2013-05-10 DIAGNOSIS — N184 Chronic kidney disease, stage 4 (severe): Secondary | ICD-10-CM | POA: Insufficient documentation

## 2013-05-10 DIAGNOSIS — R059 Cough, unspecified: Secondary | ICD-10-CM | POA: Diagnosis not present

## 2013-05-10 LAB — POCT HEMOGLOBIN-HEMACUE: HEMOGLOBIN: 11.7 g/dL — AB (ref 12.0–15.0)

## 2013-05-10 MED ORDER — EPOETIN ALFA 10000 UNIT/ML IJ SOLN
10000.0000 [IU] | INTRAMUSCULAR | Status: DC
  Administered 2013-05-10: 10000 [IU] via SUBCUTANEOUS

## 2013-05-10 MED ORDER — EPOETIN ALFA 10000 UNIT/ML IJ SOLN
INTRAMUSCULAR | Status: AC
  Administered 2013-05-10: 13:00:00 10000 [IU] via SUBCUTANEOUS
  Filled 2013-05-10: qty 1

## 2013-05-15 ENCOUNTER — Ambulatory Visit (INDEPENDENT_AMBULATORY_CARE_PROVIDER_SITE_OTHER): Payer: PRIVATE HEALTH INSURANCE | Admitting: Cardiovascular Disease

## 2013-05-15 DIAGNOSIS — I4891 Unspecified atrial fibrillation: Secondary | ICD-10-CM

## 2013-05-15 LAB — POCT INR: INR: 2.3

## 2013-05-16 ENCOUNTER — Encounter (HOSPITAL_COMMUNITY): Payer: Medicare Other

## 2013-05-21 ENCOUNTER — Encounter (HOSPITAL_COMMUNITY)
Admission: RE | Admit: 2013-05-21 | Discharge: 2013-05-21 | Disposition: A | Discharge status: Home / Self Care | Payer: Medicare Other | Source: Ambulatory Visit | Attending: Nephrology | Admitting: Nephrology

## 2013-05-21 ENCOUNTER — Encounter (HOSPITAL_COMMUNITY): Payer: Medicare Other

## 2013-05-21 DIAGNOSIS — I4891 Unspecified atrial fibrillation: Secondary | ICD-10-CM | POA: Diagnosis not present

## 2013-05-21 DIAGNOSIS — D649 Anemia, unspecified: Secondary | ICD-10-CM | POA: Diagnosis not present

## 2013-05-21 DIAGNOSIS — E039 Hypothyroidism, unspecified: Secondary | ICD-10-CM | POA: Diagnosis not present

## 2013-05-21 DIAGNOSIS — N184 Chronic kidney disease, stage 4 (severe): Secondary | ICD-10-CM | POA: Diagnosis not present

## 2013-05-21 DIAGNOSIS — I509 Heart failure, unspecified: Secondary | ICD-10-CM | POA: Diagnosis not present

## 2013-05-21 LAB — FERRITIN: Ferritin: 441 ng/mL — ABNORMAL HIGH (ref 10–291)

## 2013-05-21 LAB — IRON AND TIBC
Iron: 30 ug/dL — ABNORMAL LOW (ref 42–135)
Saturation Ratios: 16 % — ABNORMAL LOW (ref 20–55)
TIBC: 191 ug/dL — ABNORMAL LOW (ref 250–470)
UIBC: 161 ug/dL (ref 125–400)

## 2013-05-21 LAB — POCT HEMOGLOBIN-HEMACUE: Hemoglobin: 11.4 g/dL — ABNORMAL LOW (ref 12.0–15.0)

## 2013-05-21 MED ORDER — EPOETIN ALFA 10000 UNIT/ML IJ SOLN
INTRAMUSCULAR | Status: AC
  Filled 2013-05-21: qty 1

## 2013-05-21 MED ORDER — EPOETIN ALFA 10000 UNIT/ML IJ SOLN
10000.0000 [IU] | INTRAMUSCULAR | Status: DC
  Administered 2013-05-21: 10000 [IU] via SUBCUTANEOUS

## 2013-05-29 ENCOUNTER — Ambulatory Visit (INDEPENDENT_AMBULATORY_CARE_PROVIDER_SITE_OTHER): Payer: PRIVATE HEALTH INSURANCE | Admitting: Pharmacist

## 2013-05-29 ENCOUNTER — Encounter (HOSPITAL_COMMUNITY)
Admission: RE | Admit: 2013-05-29 | Discharge: 2013-05-29 | Disposition: A | Discharge status: Home / Self Care | Payer: Medicare Other | Source: Ambulatory Visit | Attending: Nephrology | Admitting: Nephrology

## 2013-05-29 DIAGNOSIS — N184 Chronic kidney disease, stage 4 (severe): Secondary | ICD-10-CM | POA: Diagnosis not present

## 2013-05-29 DIAGNOSIS — D649 Anemia, unspecified: Secondary | ICD-10-CM | POA: Diagnosis not present

## 2013-05-29 DIAGNOSIS — I4891 Unspecified atrial fibrillation: Secondary | ICD-10-CM

## 2013-05-29 DIAGNOSIS — I509 Heart failure, unspecified: Secondary | ICD-10-CM | POA: Diagnosis not present

## 2013-05-29 DIAGNOSIS — E039 Hypothyroidism, unspecified: Secondary | ICD-10-CM | POA: Diagnosis not present

## 2013-05-29 DIAGNOSIS — Z7901 Long term (current) use of anticoagulants: Secondary | ICD-10-CM | POA: Diagnosis not present

## 2013-05-29 LAB — POCT INR: INR: 2.5

## 2013-05-29 LAB — POCT HEMOGLOBIN-HEMACUE: HEMOGLOBIN: 12.1 g/dL (ref 12.0–15.0)

## 2013-05-29 MED ORDER — EPOETIN ALFA 10000 UNIT/ML IJ SOLN: 10000.0000 [IU] | INTRAMUSCULAR | Status: DC

## 2013-06-12 ENCOUNTER — Ambulatory Visit (INDEPENDENT_AMBULATORY_CARE_PROVIDER_SITE_OTHER): Payer: Medicare Other | Admitting: Pharmacist

## 2013-06-12 ENCOUNTER — Encounter (HOSPITAL_COMMUNITY)
Admission: RE | Admit: 2013-06-12 | Discharge: 2013-06-12 | Disposition: A | Discharge status: Home / Self Care | Payer: Medicare Other | Source: Ambulatory Visit | Attending: Nephrology | Admitting: Nephrology

## 2013-06-12 DIAGNOSIS — N184 Chronic kidney disease, stage 4 (severe): Secondary | ICD-10-CM | POA: Insufficient documentation

## 2013-06-12 DIAGNOSIS — I4891 Unspecified atrial fibrillation: Secondary | ICD-10-CM | POA: Insufficient documentation

## 2013-06-12 DIAGNOSIS — E039 Hypothyroidism, unspecified: Secondary | ICD-10-CM | POA: Diagnosis not present

## 2013-06-12 DIAGNOSIS — I509 Heart failure, unspecified: Secondary | ICD-10-CM | POA: Diagnosis not present

## 2013-06-12 DIAGNOSIS — D649 Anemia, unspecified: Secondary | ICD-10-CM | POA: Diagnosis not present

## 2013-06-12 LAB — POCT INR: INR: 2.7

## 2013-06-12 MED ORDER — EPOETIN ALFA 10000 UNIT/ML IJ SOLN: 10000.0000 [IU] | INTRAMUSCULAR | Status: DC

## 2013-06-13 LAB — POCT HEMOGLOBIN-HEMACUE: HEMOGLOBIN: 12 g/dL (ref 12.0–15.0)

## 2013-06-26 ENCOUNTER — Ambulatory Visit (INDEPENDENT_AMBULATORY_CARE_PROVIDER_SITE_OTHER): Payer: Medicare Other | Admitting: Pharmacist

## 2013-06-26 ENCOUNTER — Encounter (HOSPITAL_COMMUNITY): Payer: Medicare Other

## 2013-06-26 DIAGNOSIS — I4891 Unspecified atrial fibrillation: Secondary | ICD-10-CM

## 2013-06-26 LAB — POCT INR: INR: 2.2

## 2013-07-03 ENCOUNTER — Encounter (HOSPITAL_COMMUNITY)
Admission: RE | Admit: 2013-07-03 | Discharge: 2013-07-03 | Disposition: A | Discharge status: Home / Self Care | Payer: Medicare Other | Source: Ambulatory Visit | Attending: Nephrology | Admitting: Nephrology

## 2013-07-03 LAB — IRON AND TIBC
IRON: 52 ug/dL (ref 42–135)
Saturation Ratios: 24 % (ref 20–55)
TIBC: 220 ug/dL — ABNORMAL LOW (ref 250–470)
UIBC: 168 ug/dL (ref 125–400)

## 2013-07-03 LAB — FERRITIN: FERRITIN: 479 ng/mL — AB (ref 10–291)

## 2013-07-03 LAB — POCT HEMOGLOBIN-HEMACUE: HEMOGLOBIN: 11 g/dL — AB (ref 12.0–15.0)

## 2013-07-03 MED ORDER — EPOETIN ALFA 10000 UNIT/ML IJ SOLN
INTRAMUSCULAR | Status: AC
  Administered 2013-07-03: 10000 [IU] via SUBCUTANEOUS
  Filled 2013-07-03: qty 1

## 2013-07-03 MED ORDER — EPOETIN ALFA 10000 UNIT/ML IJ SOLN: 10000.0000 [IU] | INTRAMUSCULAR | Status: DC

## 2013-07-09 ENCOUNTER — Other Ambulatory Visit: Payer: Self-pay | Admitting: *Deleted

## 2013-07-09 MED ORDER — WARFARIN SODIUM 3 MG PO TABS: ORAL_TABLET | ORAL | Status: DC

## 2013-07-10 ENCOUNTER — Ambulatory Visit (INDEPENDENT_AMBULATORY_CARE_PROVIDER_SITE_OTHER): Payer: Medicare Other | Admitting: Pharmacist

## 2013-07-10 DIAGNOSIS — I4891 Unspecified atrial fibrillation: Secondary | ICD-10-CM

## 2013-07-10 LAB — POCT INR: INR: 2.7

## 2013-07-12 ENCOUNTER — Encounter (HOSPITAL_COMMUNITY)
Admission: RE | Admit: 2013-07-12 | Discharge: 2013-07-12 | Disposition: A | Discharge status: Home / Self Care | Payer: Medicare Other | Source: Ambulatory Visit | Attending: Nephrology | Admitting: Nephrology

## 2013-07-12 DIAGNOSIS — I4891 Unspecified atrial fibrillation: Secondary | ICD-10-CM | POA: Insufficient documentation

## 2013-07-12 DIAGNOSIS — N184 Chronic kidney disease, stage 4 (severe): Secondary | ICD-10-CM | POA: Insufficient documentation

## 2013-07-12 DIAGNOSIS — D649 Anemia, unspecified: Secondary | ICD-10-CM | POA: Diagnosis not present

## 2013-07-12 DIAGNOSIS — I509 Heart failure, unspecified: Secondary | ICD-10-CM | POA: Diagnosis not present

## 2013-07-12 DIAGNOSIS — E039 Hypothyroidism, unspecified: Secondary | ICD-10-CM | POA: Diagnosis not present

## 2013-07-12 MED ORDER — EPOETIN ALFA 10000 UNIT/ML IJ SOLN
INTRAMUSCULAR | Status: AC
  Filled 2013-07-12: qty 1

## 2013-07-12 MED ORDER — EPOETIN ALFA 10000 UNIT/ML IJ SOLN
10000.0000 [IU] | INTRAMUSCULAR | Status: DC
  Administered 2013-07-12: 10000 [IU] via SUBCUTANEOUS

## 2013-07-15 LAB — POCT HEMOGLOBIN-HEMACUE: Hemoglobin: 11.5 g/dL — ABNORMAL LOW (ref 12.0–15.0)

## 2013-07-19 ENCOUNTER — Encounter (HOSPITAL_COMMUNITY)
Admission: RE | Admit: 2013-07-19 | Discharge: 2013-07-19 | Disposition: A | Discharge status: Home / Self Care | Payer: Medicare Other | Source: Ambulatory Visit | Attending: Nephrology | Admitting: Nephrology

## 2013-07-19 LAB — POCT HEMOGLOBIN-HEMACUE: HEMOGLOBIN: 11.9 g/dL — AB (ref 12.0–15.0)

## 2013-07-19 MED ORDER — EPOETIN ALFA 10000 UNIT/ML IJ SOLN
INTRAMUSCULAR | Status: AC
  Administered 2013-07-19: 10000 [IU]
  Filled 2013-07-19: qty 1

## 2013-07-19 MED ORDER — EPOETIN ALFA 10000 UNIT/ML IJ SOLN: 10000.0000 [IU] | INTRAMUSCULAR | Status: DC

## 2013-07-24 ENCOUNTER — Ambulatory Visit (INDEPENDENT_AMBULATORY_CARE_PROVIDER_SITE_OTHER): Payer: Medicare Other | Admitting: Pharmacist

## 2013-07-24 DIAGNOSIS — I4891 Unspecified atrial fibrillation: Secondary | ICD-10-CM | POA: Diagnosis not present

## 2013-07-24 DIAGNOSIS — Z7901 Long term (current) use of anticoagulants: Secondary | ICD-10-CM | POA: Diagnosis not present

## 2013-07-24 LAB — POCT INR: INR: 2

## 2013-07-26 ENCOUNTER — Encounter (HOSPITAL_COMMUNITY)
Admission: RE | Admit: 2013-07-26 | Discharge: 2013-07-26 | Disposition: A | Discharge status: Home / Self Care | Payer: Medicare Other | Source: Ambulatory Visit | Attending: Nephrology | Admitting: Nephrology

## 2013-07-26 LAB — POCT HEMOGLOBIN-HEMACUE: Hemoglobin: 11.6 g/dL — ABNORMAL LOW (ref 12.0–15.0)

## 2013-07-26 MED ORDER — EPOETIN ALFA 10000 UNIT/ML IJ SOLN
10000.0000 [IU] | INTRAMUSCULAR | Status: DC
  Administered 2013-07-26: 10000 [IU] via SUBCUTANEOUS

## 2013-07-26 MED ORDER — EPOETIN ALFA 10000 UNIT/ML IJ SOLN
INTRAMUSCULAR | Status: AC
  Administered 2013-07-26: 10000 [IU] via SUBCUTANEOUS
  Filled 2013-07-26: qty 1

## 2013-08-02 ENCOUNTER — Encounter (HOSPITAL_COMMUNITY)
Admission: RE | Admit: 2013-08-02 | Discharge: 2013-08-02 | Disposition: A | Discharge status: Home / Self Care | Payer: Medicare Other | Source: Ambulatory Visit | Attending: Nephrology | Admitting: Nephrology

## 2013-08-02 LAB — IRON AND TIBC
Iron: 56 ug/dL (ref 42–135)
Saturation Ratios: 24 % (ref 20–55)
TIBC: 233 ug/dL — ABNORMAL LOW (ref 250–470)
UIBC: 177 ug/dL (ref 125–400)

## 2013-08-02 LAB — POCT HEMOGLOBIN-HEMACUE: HEMOGLOBIN: 12.1 g/dL (ref 12.0–15.0)

## 2013-08-02 LAB — FERRITIN: Ferritin: 315 ng/mL — ABNORMAL HIGH (ref 10–291)

## 2013-08-02 MED ORDER — EPOETIN ALFA 10000 UNIT/ML IJ SOLN: 10000.0000 [IU] | INTRAMUSCULAR | Status: DC

## 2013-08-07 ENCOUNTER — Ambulatory Visit (INDEPENDENT_AMBULATORY_CARE_PROVIDER_SITE_OTHER): Payer: Medicare Other | Admitting: Interventional Cardiology

## 2013-08-07 DIAGNOSIS — I4891 Unspecified atrial fibrillation: Secondary | ICD-10-CM

## 2013-08-07 LAB — POCT INR: INR: 1.7

## 2013-08-15 ENCOUNTER — Ambulatory Visit (INDEPENDENT_AMBULATORY_CARE_PROVIDER_SITE_OTHER): Payer: Medicare Other | Admitting: Pharmacist

## 2013-08-15 DIAGNOSIS — I4891 Unspecified atrial fibrillation: Secondary | ICD-10-CM

## 2013-08-15 LAB — POCT INR: INR: 2.3

## 2013-08-16 ENCOUNTER — Encounter (HOSPITAL_COMMUNITY)
Admission: RE | Admit: 2013-08-16 | Discharge: 2013-08-16 | Disposition: A | Discharge status: Home / Self Care | Payer: Medicare Other | Source: Ambulatory Visit | Attending: Nephrology | Admitting: Nephrology

## 2013-08-16 DIAGNOSIS — I4891 Unspecified atrial fibrillation: Secondary | ICD-10-CM | POA: Insufficient documentation

## 2013-08-16 DIAGNOSIS — I509 Heart failure, unspecified: Secondary | ICD-10-CM | POA: Diagnosis not present

## 2013-08-16 DIAGNOSIS — N184 Chronic kidney disease, stage 4 (severe): Secondary | ICD-10-CM | POA: Insufficient documentation

## 2013-08-16 DIAGNOSIS — E039 Hypothyroidism, unspecified: Secondary | ICD-10-CM | POA: Insufficient documentation

## 2013-08-16 DIAGNOSIS — D649 Anemia, unspecified: Secondary | ICD-10-CM | POA: Insufficient documentation

## 2013-08-16 LAB — POCT HEMOGLOBIN-HEMACUE: HEMOGLOBIN: 11.6 g/dL — AB (ref 12.0–15.0)

## 2013-08-16 MED ORDER — EPOETIN ALFA 10000 UNIT/ML IJ SOLN
10000.0000 [IU] | INTRAMUSCULAR | Status: DC
  Administered 2013-08-16: 10000 [IU] via SUBCUTANEOUS

## 2013-08-16 MED ORDER — EPOETIN ALFA 10000 UNIT/ML IJ SOLN
INTRAMUSCULAR | Status: AC
  Administered 2013-08-16: 10000 [IU] via SUBCUTANEOUS
  Filled 2013-08-16: qty 1

## 2013-08-19 DIAGNOSIS — E1129 Type 2 diabetes mellitus with other diabetic kidney complication: Secondary | ICD-10-CM | POA: Diagnosis not present

## 2013-08-19 DIAGNOSIS — I2510 Atherosclerotic heart disease of native coronary artery without angina pectoris: Secondary | ICD-10-CM | POA: Diagnosis not present

## 2013-08-19 DIAGNOSIS — I4891 Unspecified atrial fibrillation: Secondary | ICD-10-CM | POA: Diagnosis not present

## 2013-08-19 DIAGNOSIS — N184 Chronic kidney disease, stage 4 (severe): Secondary | ICD-10-CM | POA: Diagnosis not present

## 2013-08-19 DIAGNOSIS — I5032 Chronic diastolic (congestive) heart failure: Secondary | ICD-10-CM | POA: Diagnosis not present

## 2013-08-19 DIAGNOSIS — E890 Postprocedural hypothyroidism: Secondary | ICD-10-CM | POA: Diagnosis not present

## 2013-08-26 ENCOUNTER — Encounter (HOSPITAL_COMMUNITY)
Admission: RE | Admit: 2013-08-26 | Discharge: 2013-08-26 | Disposition: A | Discharge status: Home / Self Care | Payer: Medicare Other | Source: Ambulatory Visit | Attending: Nephrology | Admitting: Nephrology

## 2013-08-26 DIAGNOSIS — I4891 Unspecified atrial fibrillation: Secondary | ICD-10-CM | POA: Diagnosis not present

## 2013-08-26 DIAGNOSIS — D649 Anemia, unspecified: Secondary | ICD-10-CM | POA: Diagnosis not present

## 2013-08-26 DIAGNOSIS — E039 Hypothyroidism, unspecified: Secondary | ICD-10-CM | POA: Diagnosis not present

## 2013-08-26 DIAGNOSIS — N184 Chronic kidney disease, stage 4 (severe): Secondary | ICD-10-CM | POA: Diagnosis not present

## 2013-08-26 DIAGNOSIS — I509 Heart failure, unspecified: Secondary | ICD-10-CM | POA: Diagnosis not present

## 2013-08-26 LAB — POCT HEMOGLOBIN-HEMACUE: Hemoglobin: 11.5 g/dL — ABNORMAL LOW (ref 12.0–15.0)

## 2013-08-26 MED ORDER — EPOETIN ALFA 10000 UNIT/ML IJ SOLN
INTRAMUSCULAR | Status: AC
  Administered 2013-08-26: 10000 [IU] via SUBCUTANEOUS
  Filled 2013-08-26: qty 1

## 2013-08-26 MED ORDER — EPOETIN ALFA 10000 UNIT/ML IJ SOLN
10000.0000 [IU] | INTRAMUSCULAR | Status: DC
  Administered 2013-08-26: 10000 [IU] via SUBCUTANEOUS

## 2013-08-27 LAB — POCT HEMOGLOBIN-HEMACUE: HEMOGLOBIN: 9.9 g/dL — AB (ref 12.0–15.0)

## 2013-08-28 ENCOUNTER — Telehealth: Payer: Self-pay | Admitting: Cardiology

## 2013-08-28 ENCOUNTER — Ambulatory Visit (INDEPENDENT_AMBULATORY_CARE_PROVIDER_SITE_OTHER): Payer: Medicare Other | Admitting: Cardiovascular Disease

## 2013-08-28 DIAGNOSIS — I4891 Unspecified atrial fibrillation: Secondary | ICD-10-CM

## 2013-08-28 LAB — POCT INR: INR: 2.2

## 2013-08-28 NOTE — Telephone Encounter (Signed)
Let's stop the metolazone.  Continue to monitor weights.  Make sure she has follow up appt.

## 2013-08-28 NOTE — Telephone Encounter (Signed)
Thanks for update.  Perhaps she needs extra potassium when taking the metolazone.  On the day she takes the metolazone, have her take 77meq of KCL on those days.

## 2013-08-28 NOTE — Telephone Encounter (Signed)
Spoke with patient's daughter who is caregiver who states patient is getting very dehyrdrated on the days she takes her Metolazone.  Daughter states patient's weight goes from 200 lb down to 192 lbs.  States patient generally feels weaker on the days she takes this medication, however this past week she felt so weak that she almost went to the ER and also the medication increased her blood suger.  Daughter states after 2 cups of water the patient felt better and did not go to the hospital.  I reviewed patient's fluid intake with her daughter who states patient drinks 3 cups of coffee daily and drinks water if she gets thirsty during the day.  Daughter states patient is limited to 3-4 cups of fluid daily.  I advised daughter to increase water and decrease coffee intake on the days she takes the Metolazone and to drink an extra cup of water on this day for a total of 4-5 cups.  I advised that I will send message to  Dr. Marlou Porch who is not in the office today to see if he would like to make an additional changes to patient's medications.  Daughter verbalized understanding and agreement.

## 2013-08-28 NOTE — Telephone Encounter (Signed)
Called patient's daughter, Randell Patient, to report Dr. Marlou Porch' advice.  [name] expressed concern regarding stopping the Metolazone due to patient's weight creeps up approximately 1 lb per day and when it reaches 200 lb the patient takes the Metolazone and it effectively reduces weight back to her norm of 192 lb; daughter does not want patient to have to be hospitalized due to fluid overload.  I advised that I could schedule an appointment for patient to discuss with Dr. Marlou Porch if she thought necessary and daughter states she would like to discuss this in person with the patient tonight.  I advised her to call back tomorrow and ask for Dr. Kingsley Plan nurse.  [name] verbalized understanding and agreement.

## 2013-08-28 NOTE — Telephone Encounter (Signed)
New message    Pt is on a diuretic.  When she takes it --it makes her dehydrated.  It is the pill she takes once a week.

## 2013-08-29 ENCOUNTER — Telehealth: Payer: Self-pay | Admitting: Cardiology

## 2013-08-29 NOTE — Telephone Encounter (Signed)
New message          Pt daughter said mother decided to hold off on the pill until Monday. If she keeps gaining weight then she will take it then

## 2013-08-29 NOTE — Telephone Encounter (Signed)
Returned call to patient's daughter Randell Patient.She stated her mother is afraid to take metolazone this week.Stated she takes metolazone 2.5 mg once a week.Stated last week after taking she became very dehydrated,weak.Message sent to Dr.Skains can patient just take metolazone once a week if needed.

## 2013-08-29 NOTE — Telephone Encounter (Signed)
I think that once a week metolazone if needed is fine.

## 2013-09-02 ENCOUNTER — Encounter (HOSPITAL_COMMUNITY)
Admission: RE | Admit: 2013-09-02 | Discharge: 2013-09-02 | Disposition: A | Discharge status: Home / Self Care | Payer: Medicare Other | Source: Ambulatory Visit | Attending: Nephrology | Admitting: Nephrology

## 2013-09-02 DIAGNOSIS — I509 Heart failure, unspecified: Secondary | ICD-10-CM | POA: Diagnosis not present

## 2013-09-02 DIAGNOSIS — E039 Hypothyroidism, unspecified: Secondary | ICD-10-CM | POA: Diagnosis not present

## 2013-09-02 DIAGNOSIS — D649 Anemia, unspecified: Secondary | ICD-10-CM | POA: Diagnosis not present

## 2013-09-02 DIAGNOSIS — N184 Chronic kidney disease, stage 4 (severe): Secondary | ICD-10-CM | POA: Diagnosis not present

## 2013-09-02 DIAGNOSIS — I4891 Unspecified atrial fibrillation: Secondary | ICD-10-CM | POA: Diagnosis not present

## 2013-09-02 LAB — IRON AND TIBC
Iron: 41 ug/dL — ABNORMAL LOW (ref 42–135)
Saturation Ratios: 20 % (ref 20–55)
TIBC: 209 ug/dL — ABNORMAL LOW (ref 250–470)
UIBC: 168 ug/dL (ref 125–400)

## 2013-09-02 LAB — FERRITIN: FERRITIN: 272 ng/mL (ref 10–291)

## 2013-09-02 LAB — POCT HEMOGLOBIN-HEMACUE: Hemoglobin: 11.1 g/dL — ABNORMAL LOW (ref 12.0–15.0)

## 2013-09-02 MED ORDER — EPOETIN ALFA 10000 UNIT/ML IJ SOLN
INTRAMUSCULAR | Status: AC
Start: 2013-09-02 — End: 2013-09-02
  Filled 2013-09-02: qty 1

## 2013-09-02 MED ORDER — EPOETIN ALFA 10000 UNIT/ML IJ SOLN
10000.0000 [IU] | INTRAMUSCULAR | Status: DC
  Administered 2013-09-02: 10000 [IU] via SUBCUTANEOUS

## 2013-09-02 NOTE — Telephone Encounter (Signed)
Returned call to patient's daughter Randell Patient Dr.Skains advised ok to take metolazone 2.5 mg once a week if needed.Daughter stated mother is feeling better today,has not had to take a metolazone.Advised to call back if needed.

## 2013-09-05 IMAGING — CR DG CHEST 1V PORT
1 series · 1 of 1 positions shown · non-contrast
Comparison: 11/03/2012.

CLINICAL DATA: Cough.  Sputum production.

PORTABLE CHEST - 1 VIEW

[AP]
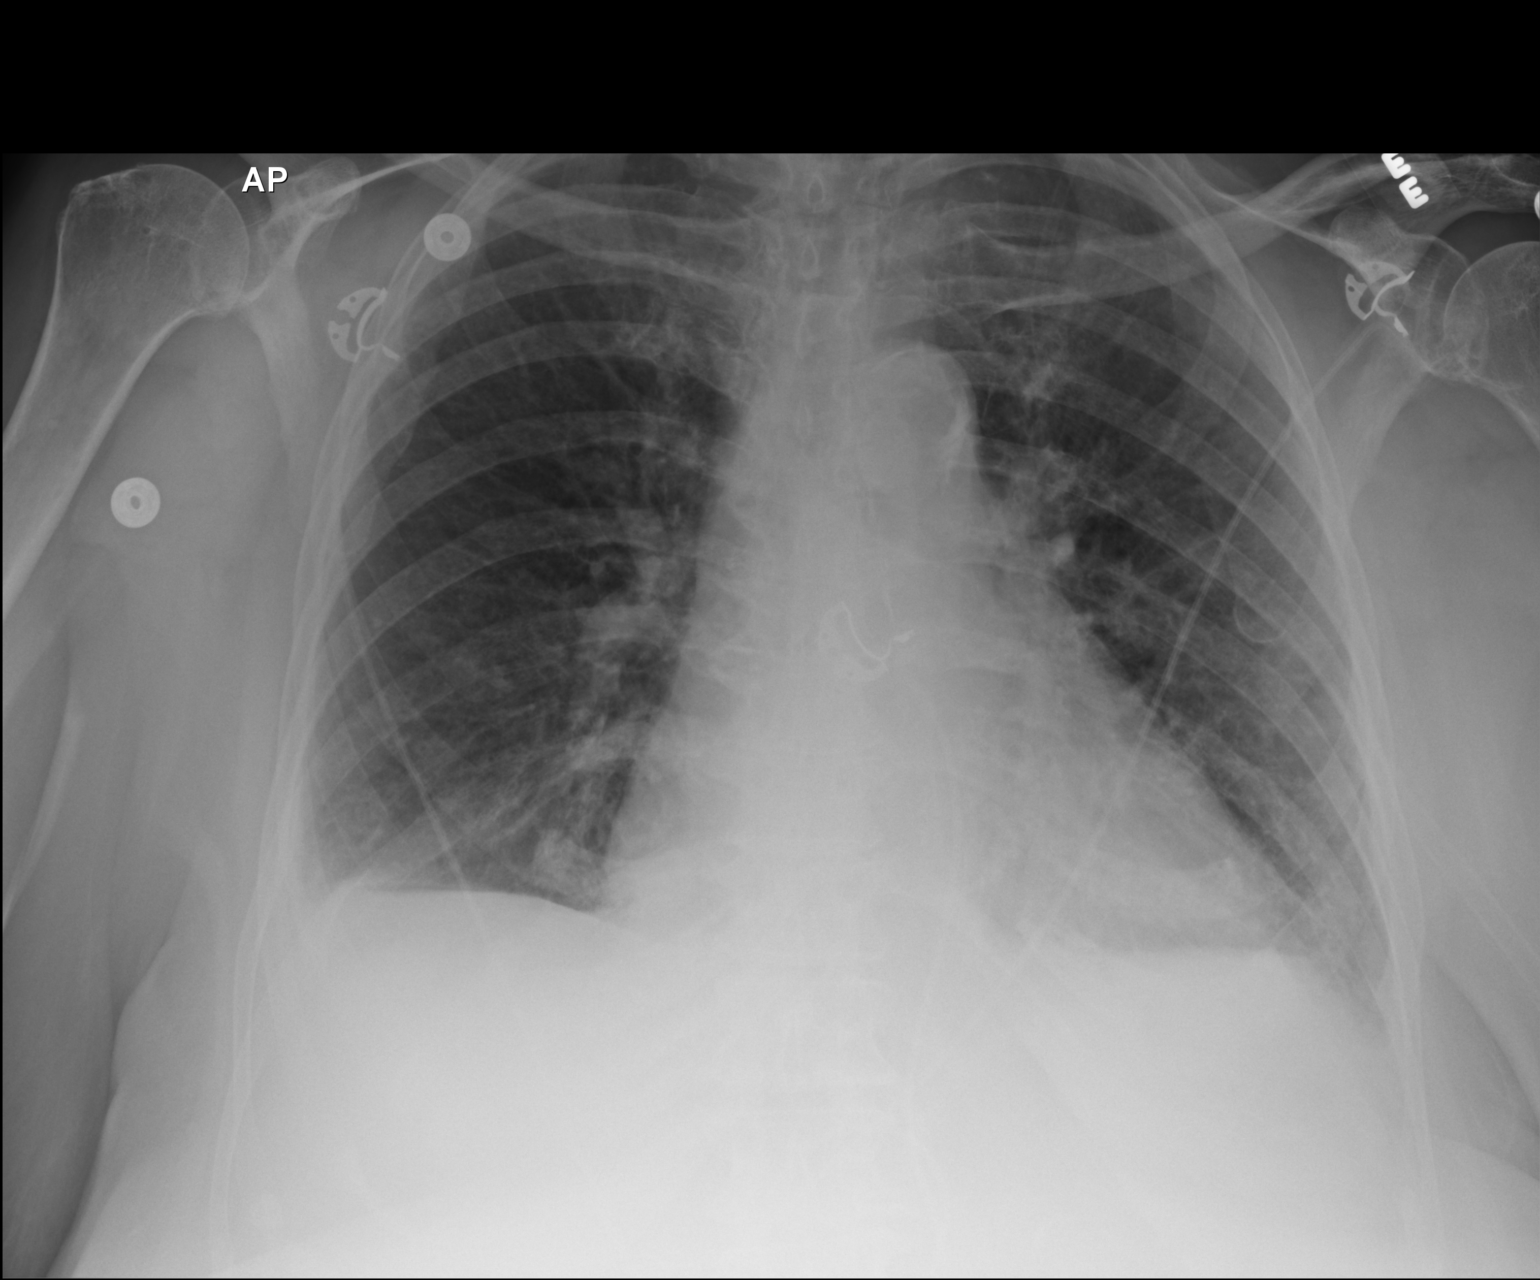

[1 of 1 positions shown; findings below may reference images not displayed]

FINDINGS: Cardiomegaly is present.  Interstitial pulmonary edema is
present at the lung bases.  Pulmonary vascular congestion.  There
is no focal consolidation identified.  Probable tiny bilateral
pleural effusions.  Aortic arch atherosclerosis.
IMPRESSION: Findings most compatible with mild CHF.  No focal consolidation to
suggest pneumonia.

## 2013-09-11 ENCOUNTER — Encounter (HOSPITAL_COMMUNITY)
Admission: RE | Admit: 2013-09-11 | Discharge: 2013-09-11 | Disposition: A | Discharge status: Home / Self Care | Payer: Medicare Other | Source: Ambulatory Visit | Attending: Nephrology | Admitting: Nephrology

## 2013-09-11 ENCOUNTER — Ambulatory Visit (INDEPENDENT_AMBULATORY_CARE_PROVIDER_SITE_OTHER): Payer: Medicare Other | Admitting: Pharmacist

## 2013-09-11 DIAGNOSIS — N184 Chronic kidney disease, stage 4 (severe): Secondary | ICD-10-CM | POA: Insufficient documentation

## 2013-09-11 DIAGNOSIS — I509 Heart failure, unspecified: Secondary | ICD-10-CM | POA: Insufficient documentation

## 2013-09-11 DIAGNOSIS — I4891 Unspecified atrial fibrillation: Secondary | ICD-10-CM

## 2013-09-11 DIAGNOSIS — E039 Hypothyroidism, unspecified: Secondary | ICD-10-CM | POA: Diagnosis not present

## 2013-09-11 DIAGNOSIS — D649 Anemia, unspecified: Secondary | ICD-10-CM | POA: Insufficient documentation

## 2013-09-11 DIAGNOSIS — Z7901 Long term (current) use of anticoagulants: Secondary | ICD-10-CM | POA: Diagnosis not present

## 2013-09-11 LAB — POCT INR: INR: 1.7

## 2013-09-11 LAB — POCT HEMOGLOBIN-HEMACUE: HEMOGLOBIN: 10.8 g/dL — AB (ref 12.0–15.0)

## 2013-09-11 MED ORDER — EPOETIN ALFA 10000 UNIT/ML IJ SOLN
10000.0000 [IU] | INTRAMUSCULAR | Status: DC
  Administered 2013-09-11: 10000 [IU] via SUBCUTANEOUS

## 2013-09-11 MED ORDER — EPOETIN ALFA 10000 UNIT/ML IJ SOLN
INTRAMUSCULAR | Status: AC
Start: 2013-09-11 — End: 2013-09-11
  Administered 2013-09-11: 10000 [IU] via SUBCUTANEOUS
  Filled 2013-09-11: qty 1

## 2013-09-12 NOTE — Telephone Encounter (Signed)
Spoke with patient ,patietn advised that she is feeling a lot better after the change in medications.

## 2013-09-19 ENCOUNTER — Encounter (HOSPITAL_COMMUNITY): Payer: Medicare Other

## 2013-09-19 ENCOUNTER — Encounter (HOSPITAL_COMMUNITY)
Admission: RE | Admit: 2013-09-19 | Discharge: 2013-09-19 | Disposition: A | Discharge status: Home / Self Care | Payer: Medicare Other | Source: Ambulatory Visit | Attending: Nephrology | Admitting: Nephrology

## 2013-09-19 LAB — POCT HEMOGLOBIN-HEMACUE: Hemoglobin: 11.7 g/dL — ABNORMAL LOW (ref 12.0–15.0)

## 2013-09-19 MED ORDER — EPOETIN ALFA 10000 UNIT/ML IJ SOLN
INTRAMUSCULAR | Status: AC
  Administered 2013-09-19: 10000 [IU] via SUBCUTANEOUS
  Filled 2013-09-19: qty 1

## 2013-09-19 MED ORDER — EPOETIN ALFA 10000 UNIT/ML IJ SOLN: 10000.0000 [IU] | INTRAMUSCULAR | Status: DC

## 2013-09-25 ENCOUNTER — Ambulatory Visit (INDEPENDENT_AMBULATORY_CARE_PROVIDER_SITE_OTHER): Payer: Medicare Other | Admitting: Cardiovascular Disease

## 2013-09-25 DIAGNOSIS — I4891 Unspecified atrial fibrillation: Secondary | ICD-10-CM

## 2013-09-25 LAB — POCT INR: INR: 2.3

## 2013-09-26 ENCOUNTER — Encounter (HOSPITAL_COMMUNITY)
Admission: RE | Admit: 2013-09-26 | Discharge: 2013-09-26 | Disposition: A | Discharge status: Home / Self Care | Payer: Medicare Other | Source: Ambulatory Visit | Attending: Nephrology | Admitting: Nephrology

## 2013-09-26 DIAGNOSIS — N184 Chronic kidney disease, stage 4 (severe): Secondary | ICD-10-CM | POA: Insufficient documentation

## 2013-09-26 DIAGNOSIS — D638 Anemia in other chronic diseases classified elsewhere: Secondary | ICD-10-CM | POA: Insufficient documentation

## 2013-09-26 LAB — POCT HEMOGLOBIN-HEMACUE: Hemoglobin: 11.1 g/dL — ABNORMAL LOW (ref 12.0–15.0)

## 2013-09-26 MED ORDER — EPOETIN ALFA 10000 UNIT/ML IJ SOLN
10000.0000 [IU] | INTRAMUSCULAR | Status: DC
  Administered 2013-09-26: 10000 [IU] via SUBCUTANEOUS

## 2013-09-26 MED ORDER — EPOETIN ALFA 10000 UNIT/ML IJ SOLN
INTRAMUSCULAR | Status: AC
Start: 2013-09-26 — End: 2013-09-26
  Filled 2013-09-26: qty 1

## 2013-10-03 ENCOUNTER — Encounter (HOSPITAL_COMMUNITY)
Admission: RE | Admit: 2013-10-03 | Discharge: 2013-10-03 | Disposition: A | Discharge status: Home / Self Care | Payer: Medicare Other | Source: Ambulatory Visit | Attending: Nephrology | Admitting: Nephrology

## 2013-10-03 LAB — POCT HEMOGLOBIN-HEMACUE: Hemoglobin: 11.4 g/dL — ABNORMAL LOW (ref 12.0–15.0)

## 2013-10-03 LAB — IRON AND TIBC
IRON: 42 ug/dL (ref 42–135)
Saturation Ratios: 19 % — ABNORMAL LOW (ref 20–55)
TIBC: 218 ug/dL — ABNORMAL LOW (ref 250–470)
UIBC: 176 ug/dL (ref 125–400)

## 2013-10-03 LAB — FERRITIN: Ferritin: 265 ng/mL (ref 10–291)

## 2013-10-03 MED ORDER — EPOETIN ALFA 10000 UNIT/ML IJ SOLN
INTRAMUSCULAR | Status: AC
  Administered 2013-10-03: 10000 [IU] via SUBCUTANEOUS
  Filled 2013-10-03: qty 1

## 2013-10-03 MED ORDER — EPOETIN ALFA 10000 UNIT/ML IJ SOLN
10000.0000 [IU] | INTRAMUSCULAR | Status: DC
  Administered 2013-10-03: 10000 [IU] via SUBCUTANEOUS

## 2013-10-09 ENCOUNTER — Ambulatory Visit (INDEPENDENT_AMBULATORY_CARE_PROVIDER_SITE_OTHER): Payer: Medicare Other | Admitting: Pharmacist

## 2013-10-09 ENCOUNTER — Encounter (HOSPITAL_COMMUNITY)
Admission: RE | Admit: 2013-10-09 | Discharge: 2013-10-09 | Disposition: A | Discharge status: Home / Self Care | Payer: Medicare Other | Source: Ambulatory Visit | Attending: Nephrology | Admitting: Nephrology

## 2013-10-09 DIAGNOSIS — N184 Chronic kidney disease, stage 4 (severe): Secondary | ICD-10-CM | POA: Insufficient documentation

## 2013-10-09 DIAGNOSIS — I4891 Unspecified atrial fibrillation: Secondary | ICD-10-CM

## 2013-10-09 LAB — POCT INR: INR: 2.3

## 2013-10-09 LAB — POCT HEMOGLOBIN-HEMACUE: Hemoglobin: 11.2 g/dL — ABNORMAL LOW (ref 12.0–15.0)

## 2013-10-09 MED ORDER — EPOETIN ALFA 10000 UNIT/ML IJ SOLN
INTRAMUSCULAR | Status: AC
  Filled 2013-10-09: qty 1

## 2013-10-09 MED ORDER — EPOETIN ALFA 10000 UNIT/ML IJ SOLN
10000.0000 [IU] | INTRAMUSCULAR | Status: DC
  Administered 2013-10-09: 10000 [IU] via SUBCUTANEOUS

## 2013-10-10 IMAGING — CR DG CHEST 2V
2 series · 2 of 2 positions shown · non-contrast
Comparison: November 06, 2012

CLINICAL DATA: Left arm pain.  The patient has a history of
congestive heart failure.

CHEST - 2 VIEW

[w chest pa]
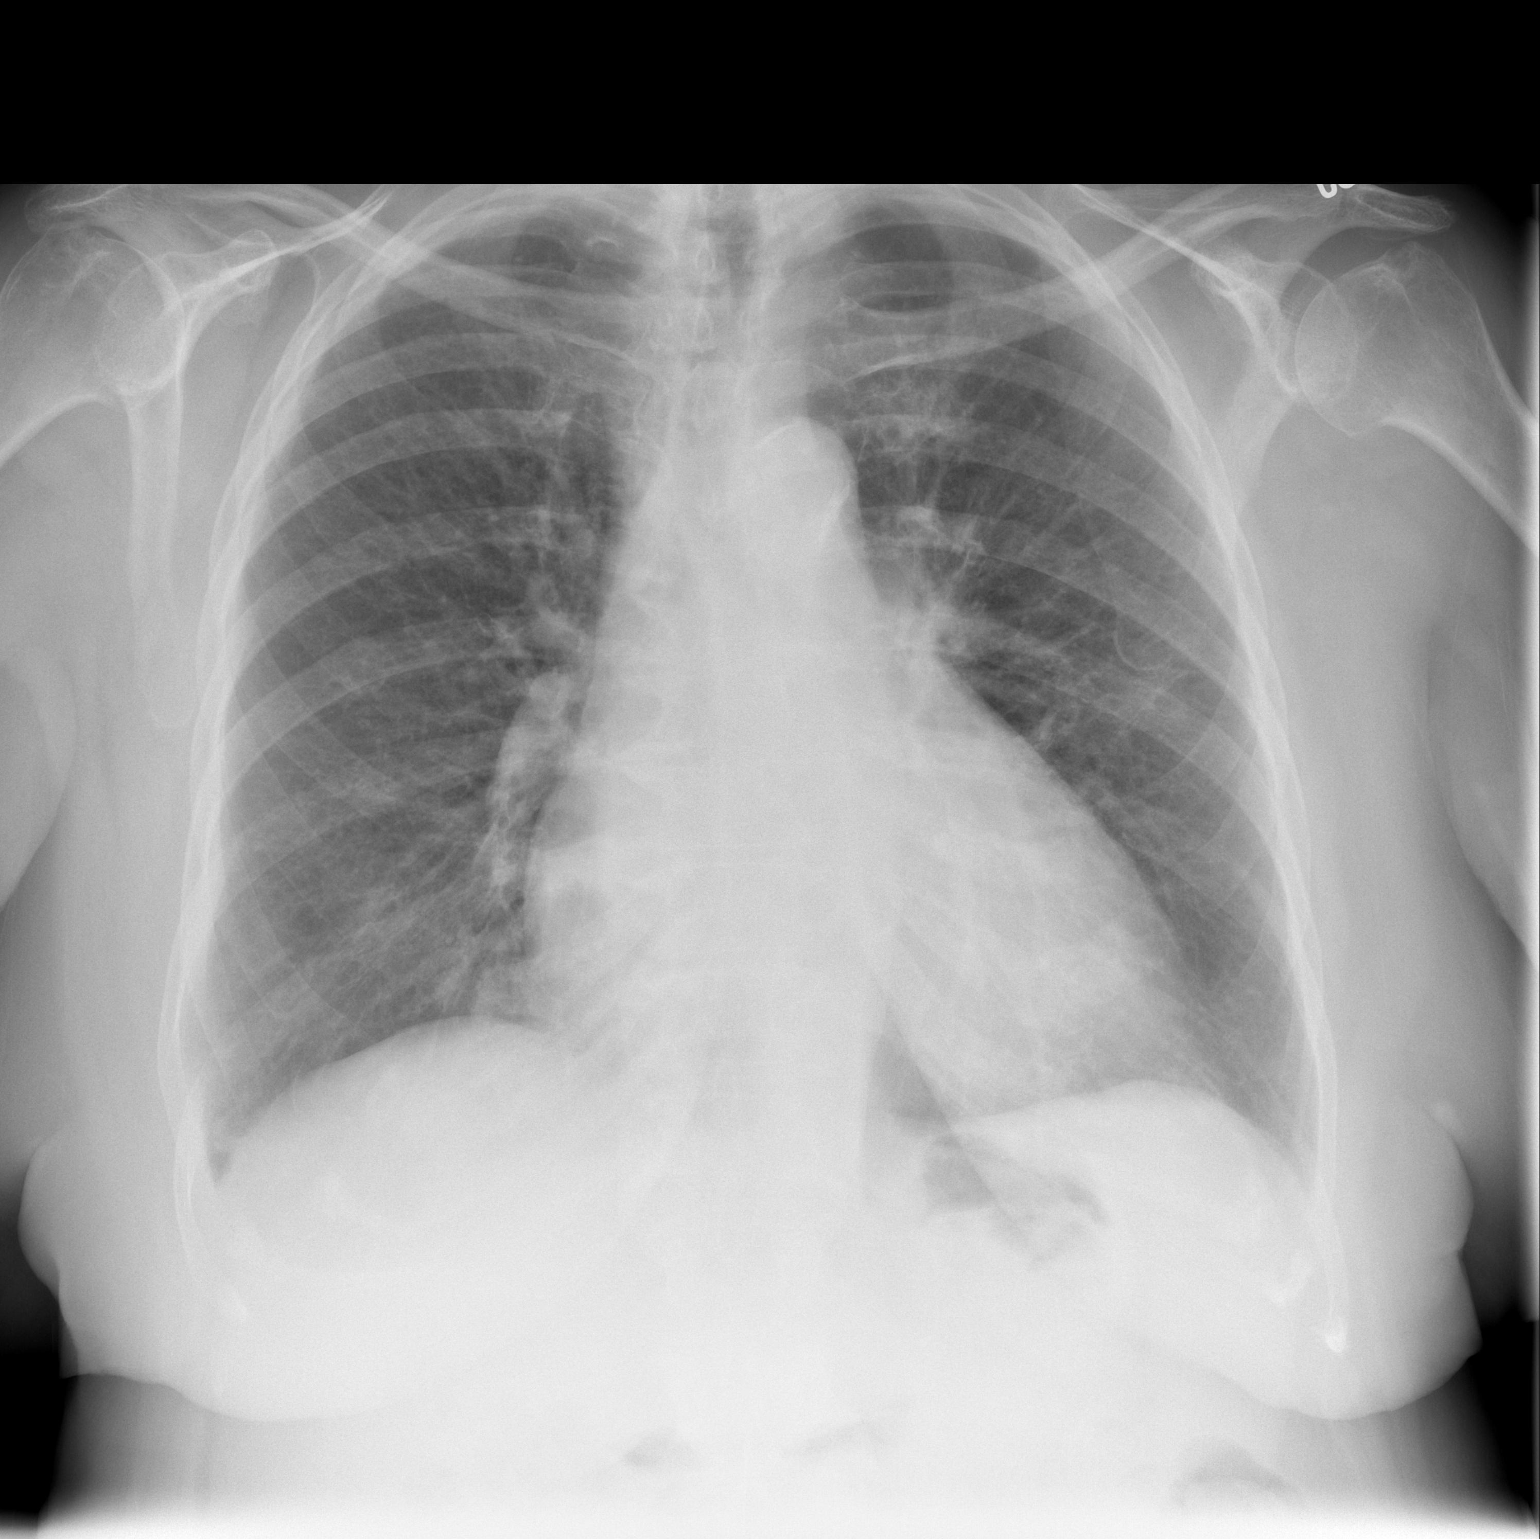

[w chest lat]
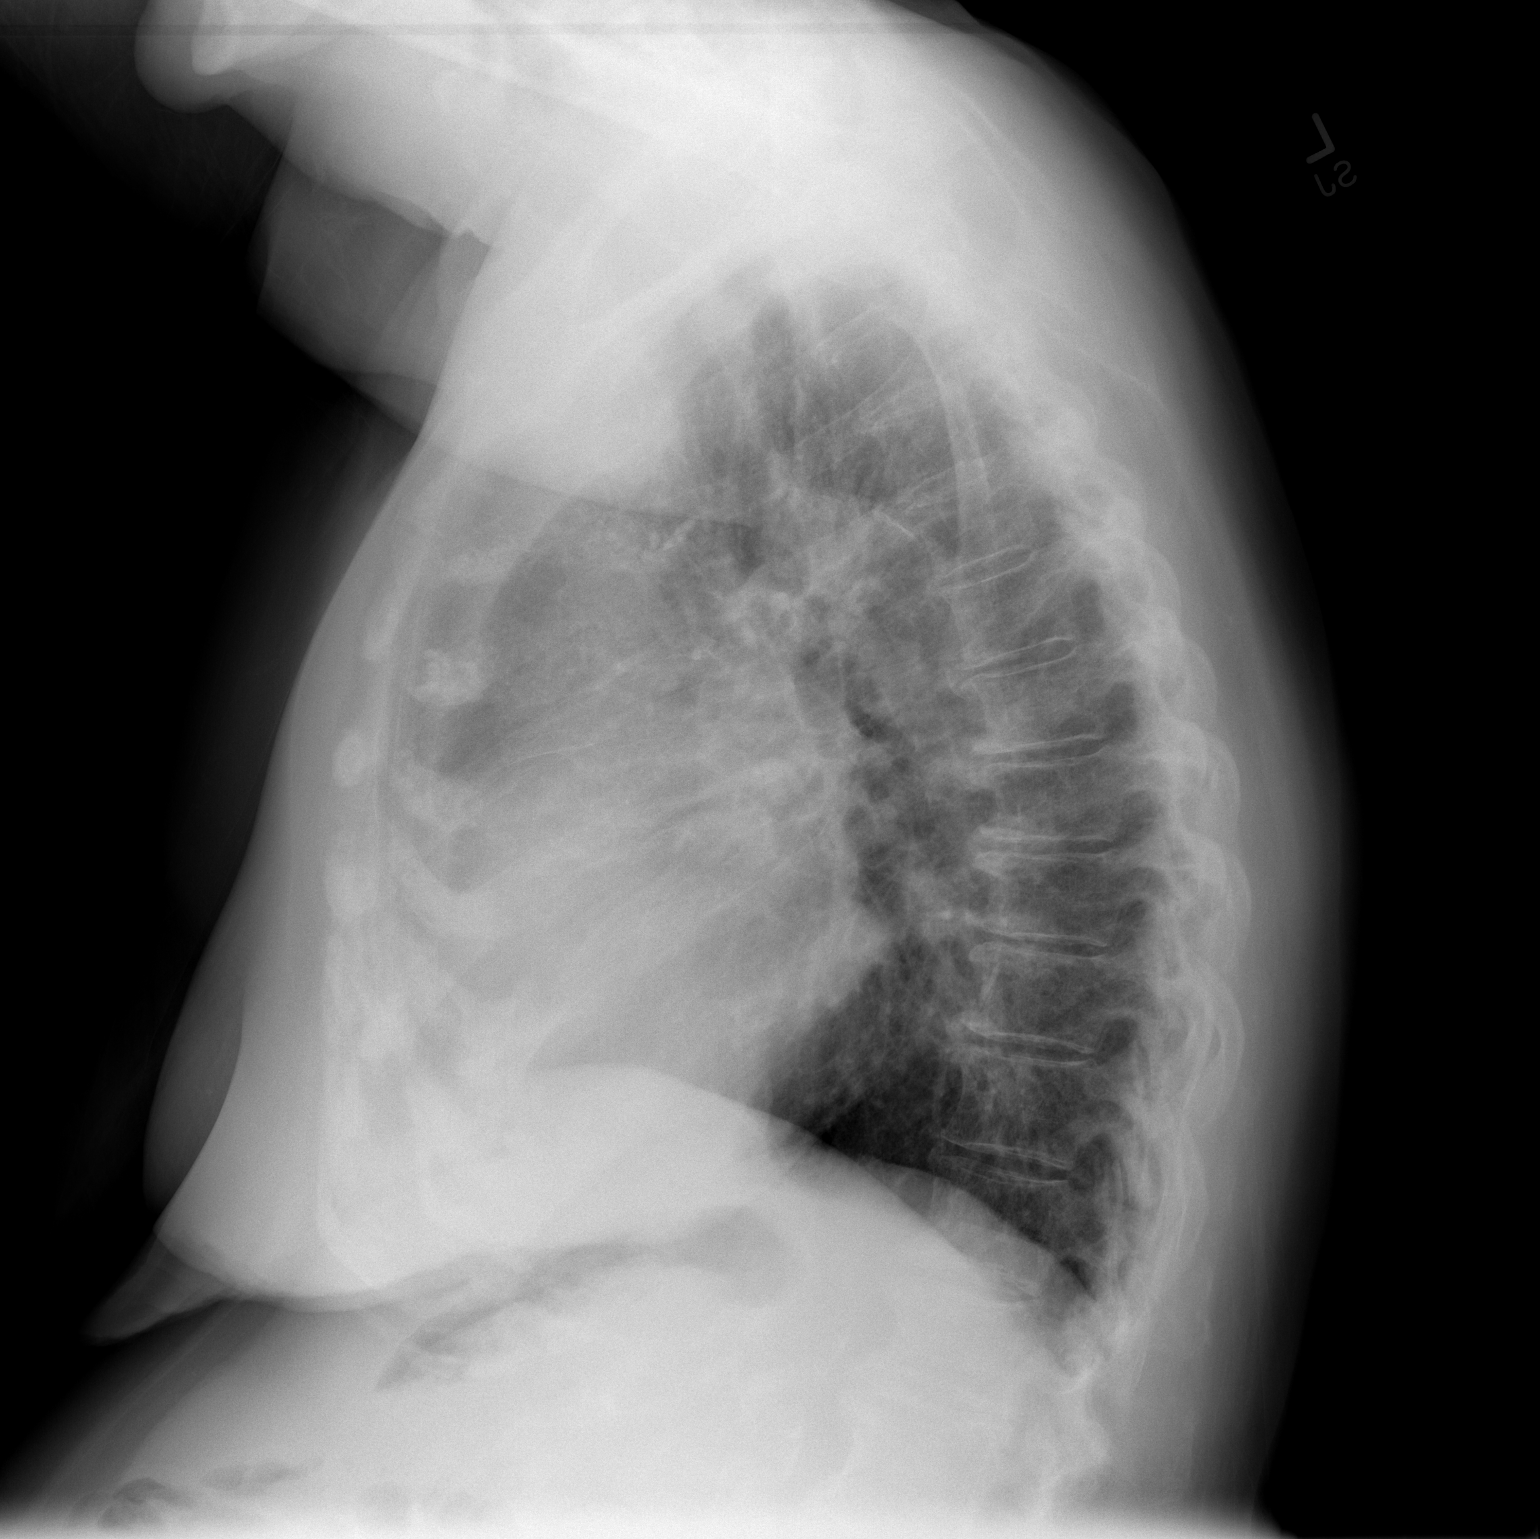

[2 of 2 positions shown; findings below may reference images not displayed]

FINDINGS: There is no focal infiltrate, pulmonary edema, or pleural
effusion.  The mediastinal contour is normal.  The heart size is
mildly enlarged.  The soft tissues and osseous structures are
stable.  There are degenerative joint changes of the left first rib
manubrial joint.
IMPRESSION: No acute cardiopulmonary disease identified.

## 2013-10-17 ENCOUNTER — Other Ambulatory Visit (HOSPITAL_COMMUNITY): Payer: Self-pay

## 2013-10-18 ENCOUNTER — Encounter (HOSPITAL_COMMUNITY)
Admission: RE | Admit: 2013-10-18 | Discharge: 2013-10-18 | Disposition: A | Discharge status: Home / Self Care | Payer: Medicare Other | Source: Ambulatory Visit | Attending: Nephrology | Admitting: Nephrology

## 2013-10-18 VITALS — BP 150/53 | HR 52 | Temp 98.7°F | Resp 20

## 2013-10-18 DIAGNOSIS — N184 Chronic kidney disease, stage 4 (severe): Secondary | ICD-10-CM

## 2013-10-18 LAB — POCT HEMOGLOBIN-HEMACUE: HEMOGLOBIN: 11.3 g/dL — AB (ref 12.0–15.0)

## 2013-10-18 MED ORDER — EPOETIN ALFA 10000 UNIT/ML IJ SOLN
10000.0000 [IU] | INTRAMUSCULAR | Status: DC
  Administered 2013-10-18: 10000 [IU] via SUBCUTANEOUS

## 2013-10-18 MED ORDER — EPOETIN ALFA 10000 UNIT/ML IJ SOLN
INTRAMUSCULAR | Status: AC
  Filled 2013-10-18: qty 1

## 2013-10-23 ENCOUNTER — Ambulatory Visit (INDEPENDENT_AMBULATORY_CARE_PROVIDER_SITE_OTHER): Payer: Medicare Other | Admitting: Cardiology

## 2013-10-23 DIAGNOSIS — I4891 Unspecified atrial fibrillation: Secondary | ICD-10-CM

## 2013-10-23 LAB — POCT INR: INR: 2.1

## 2013-10-25 ENCOUNTER — Encounter (HOSPITAL_COMMUNITY)
Admission: RE | Admit: 2013-10-25 | Discharge: 2013-10-25 | Disposition: A | Discharge status: Home / Self Care | Payer: Medicare Other | Source: Ambulatory Visit | Attending: Nephrology | Admitting: Nephrology

## 2013-10-25 VITALS — BP 129/43 | HR 53 | Temp 98.4°F | Resp 20

## 2013-10-25 DIAGNOSIS — N184 Chronic kidney disease, stage 4 (severe): Secondary | ICD-10-CM

## 2013-10-25 MED ORDER — EPOETIN ALFA 10000 UNIT/ML IJ SOLN
INTRAMUSCULAR | Status: AC
  Filled 2013-10-25: qty 1

## 2013-10-25 MED ORDER — EPOETIN ALFA 10000 UNIT/ML IJ SOLN
10000.0000 [IU] | INTRAMUSCULAR | Status: DC
  Administered 2013-10-25: 10000 [IU] via SUBCUTANEOUS

## 2013-10-28 LAB — POCT HEMOGLOBIN-HEMACUE: HEMOGLOBIN: 11.5 g/dL — AB (ref 12.0–15.0)

## 2013-11-01 ENCOUNTER — Encounter (HOSPITAL_COMMUNITY)
Admission: RE | Admit: 2013-11-01 | Discharge: 2013-11-01 | Disposition: A | Discharge status: Home / Self Care | Payer: Medicare Other | Source: Ambulatory Visit | Attending: Nephrology | Admitting: Nephrology

## 2013-11-01 DIAGNOSIS — N184 Chronic kidney disease, stage 4 (severe): Secondary | ICD-10-CM

## 2013-11-01 LAB — IRON AND TIBC
Iron: 47 ug/dL (ref 42–135)
Saturation Ratios: 21 % (ref 20–55)
TIBC: 225 ug/dL — AB (ref 250–470)
UIBC: 178 ug/dL (ref 125–400)

## 2013-11-01 LAB — FERRITIN: Ferritin: 320 ng/mL — ABNORMAL HIGH (ref 10–291)

## 2013-11-01 MED ORDER — EPOETIN ALFA 10000 UNIT/ML IJ SOLN: 10000.0000 [IU] | INTRAMUSCULAR | Status: DC

## 2013-11-01 MED ORDER — EPOETIN ALFA 10000 UNIT/ML IJ SOLN
INTRAMUSCULAR | Status: AC
  Administered 2013-11-01: 10000 [IU] via SUBCUTANEOUS
  Filled 2013-11-01: qty 1

## 2013-11-04 LAB — POCT HEMOGLOBIN-HEMACUE: HEMOGLOBIN: 11.9 g/dL — AB (ref 12.0–15.0)

## 2013-11-06 ENCOUNTER — Ambulatory Visit (INDEPENDENT_AMBULATORY_CARE_PROVIDER_SITE_OTHER): Payer: Medicare Other | Admitting: Pharmacist

## 2013-11-06 ENCOUNTER — Encounter (HOSPITAL_COMMUNITY)
Admission: RE | Admit: 2013-11-06 | Discharge: 2013-11-06 | Disposition: A | Discharge status: Home / Self Care | Payer: Medicare Other | Source: Ambulatory Visit | Attending: Nephrology | Admitting: Nephrology

## 2013-11-06 VITALS — BP 155/68 | HR 58 | Temp 98.1°F | Resp 18

## 2013-11-06 DIAGNOSIS — I4891 Unspecified atrial fibrillation: Secondary | ICD-10-CM

## 2013-11-06 DIAGNOSIS — Z7901 Long term (current) use of anticoagulants: Secondary | ICD-10-CM | POA: Diagnosis not present

## 2013-11-06 DIAGNOSIS — N184 Chronic kidney disease, stage 4 (severe): Secondary | ICD-10-CM | POA: Diagnosis not present

## 2013-11-06 LAB — POCT HEMOGLOBIN-HEMACUE: HEMOGLOBIN: 11.8 g/dL — AB (ref 12.0–15.0)

## 2013-11-06 LAB — POCT INR: INR: 1.8

## 2013-11-06 MED ORDER — EPOETIN ALFA 10000 UNIT/ML IJ SOLN
10000.0000 [IU] | INTRAMUSCULAR | Status: DC
  Administered 2013-11-06: 10000 [IU] via SUBCUTANEOUS

## 2013-11-06 MED ORDER — EPOETIN ALFA 10000 UNIT/ML IJ SOLN
INTRAMUSCULAR | Status: AC
  Filled 2013-11-06: qty 1

## 2013-11-07 ENCOUNTER — Encounter (HOSPITAL_COMMUNITY): Payer: Medicare Other

## 2013-11-11 ENCOUNTER — Other Ambulatory Visit: Payer: Self-pay | Admitting: *Deleted

## 2013-11-11 MED ORDER — WARFARIN SODIUM 3 MG PO TABS: ORAL_TABLET | ORAL | Status: DC

## 2013-11-13 ENCOUNTER — Encounter (HOSPITAL_COMMUNITY)
Admission: RE | Admit: 2013-11-13 | Discharge: 2013-11-13 | Disposition: A | Discharge status: Home / Self Care | Payer: Medicare Other | Source: Ambulatory Visit | Attending: Nephrology | Admitting: Nephrology

## 2013-11-13 VITALS — BP 129/70 | HR 55 | Temp 98.2°F

## 2013-11-13 DIAGNOSIS — N184 Chronic kidney disease, stage 4 (severe): Secondary | ICD-10-CM | POA: Diagnosis not present

## 2013-11-13 MED ORDER — EPOETIN ALFA 10000 UNIT/ML IJ SOLN: 10000.0000 [IU] | INTRAMUSCULAR | Status: DC

## 2013-11-13 MED ORDER — EPOETIN ALFA 10000 UNIT/ML IJ SOLN
INTRAMUSCULAR | Status: AC
  Administered 2013-11-13: 10000 [IU] via SUBCUTANEOUS
  Filled 2013-11-13: qty 1

## 2013-11-14 LAB — POCT HEMOGLOBIN-HEMACUE: HEMOGLOBIN: 11.2 g/dL — AB (ref 12.0–15.0)

## 2013-11-17 ENCOUNTER — Encounter (HOSPITAL_BASED_OUTPATIENT_CLINIC_OR_DEPARTMENT_OTHER): Payer: Self-pay | Admitting: Emergency Medicine

## 2013-11-17 ENCOUNTER — Emergency Department (HOSPITAL_BASED_OUTPATIENT_CLINIC_OR_DEPARTMENT_OTHER)
Admission: EM | Admit: 2013-11-17 | Discharge: 2013-11-17 | Disposition: A | Discharge status: Home / Self Care | Payer: Medicare Other | Attending: Emergency Medicine | Admitting: Emergency Medicine

## 2013-11-17 ENCOUNTER — Emergency Department (HOSPITAL_BASED_OUTPATIENT_CLINIC_OR_DEPARTMENT_OTHER): Payer: Medicare Other

## 2013-11-17 DIAGNOSIS — Y9289 Other specified places as the place of occurrence of the external cause: Secondary | ICD-10-CM | POA: Insufficient documentation

## 2013-11-17 DIAGNOSIS — Z794 Long term (current) use of insulin: Secondary | ICD-10-CM | POA: Insufficient documentation

## 2013-11-17 DIAGNOSIS — M549 Dorsalgia, unspecified: Secondary | ICD-10-CM | POA: Diagnosis not present

## 2013-11-17 DIAGNOSIS — R296 Repeated falls: Secondary | ICD-10-CM | POA: Insufficient documentation

## 2013-11-17 DIAGNOSIS — S62143A Displaced fracture of body of hamate [unciform] bone, unspecified wrist, initial encounter for closed fracture: Secondary | ICD-10-CM | POA: Diagnosis not present

## 2013-11-17 DIAGNOSIS — I509 Heart failure, unspecified: Secondary | ICD-10-CM | POA: Insufficient documentation

## 2013-11-17 DIAGNOSIS — E079 Disorder of thyroid, unspecified: Secondary | ICD-10-CM | POA: Diagnosis not present

## 2013-11-17 DIAGNOSIS — I10 Essential (primary) hypertension: Secondary | ICD-10-CM | POA: Insufficient documentation

## 2013-11-17 DIAGNOSIS — I4891 Unspecified atrial fibrillation: Secondary | ICD-10-CM | POA: Insufficient documentation

## 2013-11-17 DIAGNOSIS — E119 Type 2 diabetes mellitus without complications: Secondary | ICD-10-CM | POA: Insufficient documentation

## 2013-11-17 DIAGNOSIS — IMO0002 Reserved for concepts with insufficient information to code with codable children: Secondary | ICD-10-CM | POA: Diagnosis not present

## 2013-11-17 DIAGNOSIS — Z79899 Other long term (current) drug therapy: Secondary | ICD-10-CM | POA: Diagnosis not present

## 2013-11-17 DIAGNOSIS — Z7901 Long term (current) use of anticoagulants: Secondary | ICD-10-CM | POA: Insufficient documentation

## 2013-11-17 DIAGNOSIS — S62142A Displaced fracture of body of hamate [unciform] bone, left wrist, initial encounter for closed fracture: Secondary | ICD-10-CM

## 2013-11-17 DIAGNOSIS — Y9389 Activity, other specified: Secondary | ICD-10-CM | POA: Insufficient documentation

## 2013-11-17 DIAGNOSIS — W19XXXA Unspecified fall, initial encounter: Secondary | ICD-10-CM

## 2013-11-17 DIAGNOSIS — D649 Anemia, unspecified: Secondary | ICD-10-CM | POA: Insufficient documentation

## 2013-11-17 DIAGNOSIS — Z8742 Personal history of other diseases of the female genital tract: Secondary | ICD-10-CM | POA: Diagnosis not present

## 2013-11-17 LAB — PROTIME-INR
INR: 1.95 — ABNORMAL HIGH (ref 0.00–1.49)
PROTHROMBIN TIME: 22.2 s — AB (ref 11.6–15.2)

## 2013-11-17 MED ORDER — HYDROCODONE-ACETAMINOPHEN 5-325 MG PO TABS
1.0000 | ORAL_TABLET | Freq: Once | ORAL | Status: AC
  Administered 2013-11-17: 1 via ORAL
  Filled 2013-11-17: qty 1

## 2013-11-17 MED ORDER — HYDROCODONE-ACETAMINOPHEN 5-325 MG PO TABS: 1.0000 | ORAL_TABLET | Freq: Four times a day (QID) | ORAL | Status: DC | PRN

## 2013-11-17 NOTE — ED Provider Notes (Signed)
CSN: [id number]     Arrival date & time 11/17/13  1016 History   First MD Initiated Contact with Patient 11/17/13 1020     Chief Complaint  Patient presents with  . Fall     (Consider location/radiation/quality/duration/timing/severity/associated sxs/prior Treatment) HPI  This is an 80  yo female with history of hypertension, diabetes, congestive heart failure, atrial fibrillation who presents following a fall.  Patient states yesterday she was on her front porch using her seated walker. She leaned over to tend to her garden. She states that she leaned back and her seated walker had moved from its position. She fell forcefully onto the floor. She fell on her left wrist and buttock. She denies any other injury including hearing her head or loss of consciousness. She's complaining of coccyx pain and left wrist pain which is worse with range of motion. Tylenol has helped some. Currently she denies any pain because "I'm not moving it." She does take Coumadin for atrial fibrillation. She denies any syncope or weakness yesterday.  She is right-handed.  Past Medical History  Diagnosis Date  . Hypertension   . Diabetes mellitus   . CHF (congestive heart failure)   . Thyroid disease   . Atrial fibrillation   . Kidney failure   . Anemia 11/07/2012   Past Surgical History  Procedure Laterality Date  . Abdominal hysterectomy    . Thyroid surgery     No family history on file. History  Substance Use Topics  . Smoking status: Never Smoker   . Smokeless tobacco: Never Used  . Alcohol Use: No   OB History   Grav Para Term Preterm Abortions TAB SAB Ect Mult Living                 Review of Systems  Constitutional: Negative for fever.  Respiratory: Negative for chest tightness and shortness of breath.   Cardiovascular: Negative for chest pain.  Gastrointestinal: Negative for nausea, vomiting and abdominal pain.  Genitourinary: Negative for dysuria.  Musculoskeletal: Positive for back pain.        Coccyx pain, left wrist pain  Skin: Negative for wound.  Neurological: Negative for headaches.  Psychiatric/Behavioral: Negative for confusion.  All other systems reviewed and are negative.     Allergies  Review of patient's allergies indicates no known allergies.  Home Medications   Prior to Admission medications   Medication Sig Start Date End Date Taking? Authorizing Provider  amLODipine (NORVASC) 10 MG tablet Take 10 mg by mouth daily.    Historical Provider, MD  carvedilol (COREG) 6.25 MG tablet Take 6.25 mg by mouth 2 (two) times daily with a meal.    Historical Provider, MD  cholecalciferol (VITAMIN D) 1000 UNITS tablet Take 1,000 Units by mouth daily.    Historical Provider, MD  docusate sodium 100 MG CAPS Take 100 mg by mouth 2 (two) times daily. OTC 11/13/12   Eugenie Filler, MD  ferrous sulfate 325 (65 FE) MG tablet Take 1 tablet (325 mg total) by mouth 3 (three) times daily with meals. 11/13/12   Eugenie Filler, MD  furosemide (LASIX) 40 MG tablet Take 3 tablets (120 mg total) by mouth 2 (two) times daily. 11/13/12   Eugenie Filler, MD  glimepiride (AMARYL) 4 MG tablet Take 4 mg by mouth 2 (two) times daily.    Historical Provider, MD  HYDROcodone-acetaminophen (NORCO/VICODIN) 5-325 MG per tablet Take 1 tablet by mouth every 6 (six) hours as needed for moderate pain.  11/17/13   Merryl Hacker, MD  insulin glargine (LANTUS) 100 UNIT/ML injection Inject 25 Units into the skin at bedtime.     Historical Provider, MD  isosorbide mononitrate (IMDUR) 30 MG 24 hr tablet Take 30 mg by mouth daily.    Historical Provider, MD  levothyroxine (SYNTHROID, LEVOTHROID) 137 MCG tablet Take 137-274 mcg by mouth daily before breakfast. Takes 2 tablets on Sunday, takes 1 tablet on all remaining days.    Historical Provider, MD  metolazone (ZAROXOLYN) 2.5 MG tablet Take 2.5 mg once a week if needed 09/02/13   Candee Furbish, MD  nitroGLYCERIN (NITROSTAT) 0.4 MG SL tablet Place 0.4 mg  under the tongue every 5 (five) minutes as needed for chest pain.    Historical Provider, MD  nystatin-triamcinolone (MYCOLOG II) cream Apply 1 application topically 2 (two) times daily.    Historical Provider, MD  pantoprazole (PROTONIX) 40 MG tablet Take 40 mg by mouth 2 (two) times daily.    Historical Provider, MD  polyethylene glycol (MIRALAX / GLYCOLAX) packet Take 17 g by mouth daily as needed. otc 11/13/12   Eugenie Filler, MD  potassium chloride SA (K-DUR,KLOR-CON) 20 MEQ tablet Take 2 tablets (40 mEq total) by mouth 2 (two) times daily. 11/13/12   Eugenie Filler, MD  pravastatin (PRAVACHOL) 40 MG tablet Take 40 mg by mouth daily.    Historical Provider, MD  senna (SENOKOT) 8.6 MG TABS Take 2 tablets (17.2 mg total) by mouth daily. otc 11/13/12   Eugenie Filler, MD  silver sulfADIAZINE (SILVADENE) 1 % cream Apply 1 application topically daily.    Historical Provider, MD  sitaGLIPtin (JANUVIA) 50 MG tablet Take 25 mg by mouth daily.    Historical Provider, MD  traMADol (ULTRAM) 50 MG tablet Take 1 tablet (50 mg total) by mouth every 6 (six) hours as needed for pain. 10/05/12   Orson Eva, MD  warfarin (COUMADIN) 3 MG tablet 2 tablets on all days except 3 tablets on Wednesdays and Saturdays or as directed by Anticoagulation clinic 11/11/13   Candee Furbish, MD   BP 151/55  Pulse 57  Temp(Src) 97.8 F (36.6 C) (Oral)  Resp 18  Wt 200 lb (90.719 kg)  SpO2 98% Physical Exam  Nursing note and vitals reviewed. Constitutional: She is oriented to person, place, and time. No distress.  Elderly  HENT:  Head: Normocephalic and atraumatic.  Eyes: Pupils are equal, round, and reactive to light.  Cardiovascular: Normal rate and normal heart sounds.   Regular rhythm  Pulmonary/Chest: Effort normal and breath sounds normal. No respiratory distress. She has no wheezes.  Abdominal: Soft. There is no tenderness.  Musculoskeletal:  Tenderness to palpation over the coccyx and sacrum, there is also  tenderness and swelling over the left wrist, limited range of motion secondary to pain, no snuffbox tenderness, no obvious deformity  Neurological: She is alert and oriented to person, place, and time.  Skin: Skin is warm and dry.  No evidence of ecchymosis or contusion over the flanks or buttock  Psychiatric: She has a normal mood and affect.    ED Course  Procedures (including critical care time) Labs Review Labs Reviewed  PROTIME-INR - Abnormal; Notable for the following:    Prothrombin Time 22.2 (*)    INR 1.95 (*)    All other components within normal limits    Imaging Review Dg Sacrum/coccyx  11/17/2013   CLINICAL DATA:  Fall, back pain  EXAM: SACRUM AND COCCYX - 2+ VIEW  COMPARISON:  None.  FINDINGS: No radiographic evidence of fracture of the sacrum or coccyx.  IMPRESSION: No radiographic evidence of fracture of the sacrum or coccyx   Electronically Signed   By: Suzy Bouchard M.D.   On: 11/17/2013 11:37   Dg Wrist Complete Left  11/17/2013   CLINICAL DATA:  Fall.  Left wrist injury.  Swelling and pain.  EXAM: LEFT WRIST - COMPLETE 3+ VIEW  COMPARISON:  None.  FINDINGS: The bones are osteopenic and there are degenerative changes about the wrist. Prominent spurring noted at the first carpometacarpal joint.  There is a lucency in the ulnar margin of the hamate bone with irregularity of the cortex, and slight ulnar displacement of a small bony fragment, consistent with an acute fracture. There is also horizontally oriented linear lucency in the distal aspect of the hamate which could reflect a nondisplaced component of the fracture.  The base of the fifth metacarpal appears intact on these views. No additional carpal fractures are identified. The distal radius and ulna are intact. There is soft tissue swelling dorsal to the wrist.  IMPRESSION: Acute mildly displaced fracture of the hamate, with soft tissue swelling of the wrist.   Electronically Signed   By: Curlene Dolphin M.D.   On:  11/17/2013 11:24     EKG Interpretation None      MDM   Final diagnoses:  Closed hamate fracture, left, initial encounter  Fall, initial encounter    Patient presents following a fall last night. She reports a mechanical fall. She's complaining of left wrist pain and left coccyx pain. She otherwise has no evidence of wound or contusion. She is nontoxic-appearing.    Vital signs are reassuring. X-rays obtained and showed evidence of acute mildly displaced fracture of the hamate of the left wrist. Patient was placed in a volar splint. She is otherwise neurovascularly intact. INR was 1.95. Given no evidence of other injury or contusion, feel patient does not want further imaging at this time. Patient will be given followup with Dr. Fredna Dow.  She was given Norco for pain management. She was ambulatory at her baseline. She has family that lives with her.  After history, exam, and medical workup I feel the patient has been appropriately medically screened and is safe for discharge home. Pertinent diagnoses were discussed with the patient. Patient was given return precautions.     Merryl Hacker, MD 11/17/13 1343

## 2013-11-17 NOTE — ED Notes (Signed)
Patient reports that she got weak while outdoors in yard last pm and sat down hard on the concrete patio. Complains of tail bone pain and left wrist pain, swelling noted to wrist, ice applied on arrival

## 2013-11-17 NOTE — Discharge Instructions (Signed)
Wrist Fracture °A wrist fracture is a break in one of the bones of the wrist. Your wrist is made up of several small bones at the palm of your hand (carpal bones) and the two bones that make up your forearm (radius and ulna). The bones come together to form multiple large and small joints. The shape and design of these joints allow your wrist to bend and straighten, move side-to-side, and rotate, as in twisting your palm up or down. °CAUSES  °A fracture may occur in any of the bones in your wrist when enough force is applied to the wrist, such as when falling down onto an outstretched hand. Severe injuries may occur from a more forceful injury. °SYMPTOMS °Symptoms of wrist fractures include tenderness, bruising, and swelling. Additionally, the wrist may hang in an odd position or may be misshaped. °DIAGNOSIS °To diagnose a wrist fracture, your caregiver will physically examine your wrist. Your caregiver may also request an X-ray exam of your wrist. °TREATMENT °Treatment depends on many factors, including the nature and location of the fracture, your age, and your activity level. Treatment for wrist fracture can be nonsurgical or surgical. °For nonsurgical treatment, a plaster cast or splint may be applied to your wrist if the bone is in a good position (aligned). The cast will stay on for about 6 weeks. If the alignment of your bone is not good, it may be necessary to realign (reduce) it. After the bone is reduced, a splint usually is placed on your wrist to allow for a small amount of normal swelling. After about 1 week, the splint is removed and a cast is added. The cast is removed 2 or 3 weeks later, after the swelling goes down, causing the cast to loosen. Another cast is applied. This cast is removed after about another 2 or 3 weeks, for a total of 4 to 6 weeks of immobilization. °Sometimes the position of the bone is so far out of place that surgery is required to apply a device to hold it together as it  heals. If the bone cannot be reduced without cutting the skin around the bone (closed reduction), a cut (incision) is made to allow direct access to the bone to reduce it (open reduction). Depending on the fracture, there are a number of options for holding the bone in place while it heals, including a cast, metal pins, a plate and screws, and a device called an external fixator. With an external fixator, most of the hardware remains outside of the body. °HOME CARE INSTRUCTIONS °· To lessen swelling, keep your injured wrist elevated and move your fingers as much as possible. °· Apply ice to your wrist for the first 1 to 2 days after you have been treated or as directed by your caregiver. Applying ice helps to reduce inflammation and pain. °¨ Put ice in a plastic bag. °¨ Place a towel between your skin and the bag. °¨ Leave the ice on for 15 to 20 minutes at a time, every 2 hours while you are awake. °· Do not put pressure on any part of your cast or splint. It may break. °· Use a plastic bag to protect your cast or splint from water while bathing or showering. Do not lower your cast or splint into water. °· Only take over-the-counter or prescription medicines for pain as directed by your caregiver. °SEEK IMMEDIATE MEDICAL CARE IF:  °· Your cast or splint gets damaged or breaks. °· You have continued severe pain   or more swelling than you did before the cast was put on. °· Your skin or fingernails below the injury turn blue or gray or feel cold or numb. °· You develop decreased feeling in your fingers. °MAKE SURE YOU: °· Understand these instructions. °· Will watch your condition. °· Will get help right away if you are not doing well or get worse. °Document Released: 02/02/2005 Document Revised: 07/18/2011 Document Reviewed: 05/13/2011 °[employer]® Patient Information ©2015 [employer]. This information is not intended to replace advice given to you by your health care provider. Make sure you discuss any questions you  have with your health care provider. ° °

## 2013-11-20 ENCOUNTER — Ambulatory Visit (INDEPENDENT_AMBULATORY_CARE_PROVIDER_SITE_OTHER): Payer: Medicare Other | Admitting: Cardiology

## 2013-11-20 ENCOUNTER — Inpatient Hospital Stay (HOSPITAL_COMMUNITY): Admission: RE | Admit: 2013-11-20 | Payer: Medicare Other | Source: Ambulatory Visit

## 2013-11-20 DIAGNOSIS — S62143A Displaced fracture of body of hamate [unciform] bone, unspecified wrist, initial encounter for closed fracture: Secondary | ICD-10-CM | POA: Diagnosis not present

## 2013-11-20 LAB — POCT INR: INR: 3

## 2013-11-21 ENCOUNTER — Encounter (HOSPITAL_COMMUNITY)
Admission: RE | Admit: 2013-11-21 | Discharge: 2013-11-21 | Disposition: A | Discharge status: Home / Self Care | Payer: Medicare Other | Source: Ambulatory Visit | Attending: Nephrology | Admitting: Nephrology

## 2013-11-21 VITALS — BP 135/42 | HR 62 | Temp 98.0°F | Resp 20

## 2013-11-21 DIAGNOSIS — N184 Chronic kidney disease, stage 4 (severe): Secondary | ICD-10-CM | POA: Diagnosis not present

## 2013-11-21 LAB — POCT HEMOGLOBIN-HEMACUE: HEMOGLOBIN: 11.3 g/dL — AB (ref 12.0–15.0)

## 2013-11-21 MED ORDER — EPOETIN ALFA 10000 UNIT/ML IJ SOLN
10000.0000 [IU] | INTRAMUSCULAR | Status: DC
  Administered 2013-11-21: 10000 [IU] via SUBCUTANEOUS

## 2013-11-21 MED ORDER — EPOETIN ALFA 10000 UNIT/ML IJ SOLN
INTRAMUSCULAR | Status: AC
  Filled 2013-11-21: qty 1

## 2013-11-28 ENCOUNTER — Encounter (HOSPITAL_COMMUNITY)
Admission: RE | Admit: 2013-11-28 | Discharge: 2013-11-28 | Disposition: A | Discharge status: Home / Self Care | Payer: Medicare Other | Source: Ambulatory Visit | Attending: Nephrology | Admitting: Nephrology

## 2013-11-28 VITALS — BP 138/50 | HR 70 | Temp 98.3°F | Resp 20

## 2013-11-28 DIAGNOSIS — N184 Chronic kidney disease, stage 4 (severe): Secondary | ICD-10-CM | POA: Diagnosis not present

## 2013-11-28 LAB — IRON AND TIBC
Iron: 46 ug/dL (ref 42–135)
UIBC: <15 ug/dL — ABNORMAL LOW (ref 125–400)

## 2013-11-28 LAB — FERRITIN: Ferritin: 374 ng/mL — ABNORMAL HIGH (ref 10–291)

## 2013-11-28 LAB — POCT HEMOGLOBIN-HEMACUE: HEMOGLOBIN: 12 g/dL (ref 12.0–15.0)

## 2013-11-28 MED ORDER — EPOETIN ALFA 10000 UNIT/ML IJ SOLN: 10000.0000 [IU] | INTRAMUSCULAR | Status: DC

## 2013-11-28 MED ORDER — EPOETIN ALFA 10000 UNIT/ML IJ SOLN
INTRAMUSCULAR | Status: AC
  Filled 2013-11-28: qty 1

## 2013-12-02 DIAGNOSIS — S62143A Displaced fracture of body of hamate [unciform] bone, unspecified wrist, initial encounter for closed fracture: Secondary | ICD-10-CM | POA: Diagnosis not present

## 2013-12-04 ENCOUNTER — Ambulatory Visit (INDEPENDENT_AMBULATORY_CARE_PROVIDER_SITE_OTHER): Payer: Medicare Other | Admitting: Pharmacist

## 2013-12-04 DIAGNOSIS — I4891 Unspecified atrial fibrillation: Secondary | ICD-10-CM

## 2013-12-04 LAB — POCT INR: INR: 2.5

## 2013-12-13 ENCOUNTER — Encounter (HOSPITAL_COMMUNITY)
Admission: RE | Admit: 2013-12-13 | Discharge: 2013-12-13 | Disposition: A | Discharge status: Home / Self Care | Payer: Medicare Other | Source: Ambulatory Visit | Attending: Nephrology | Admitting: Nephrology

## 2013-12-13 VITALS — BP 150/73 | HR 59 | Temp 97.2°F | Resp 18

## 2013-12-13 DIAGNOSIS — N184 Chronic kidney disease, stage 4 (severe): Secondary | ICD-10-CM | POA: Insufficient documentation

## 2013-12-13 LAB — POCT HEMOGLOBIN-HEMACUE: HEMOGLOBIN: 11.1 g/dL — AB (ref 12.0–15.0)

## 2013-12-13 MED ORDER — EPOETIN ALFA 10000 UNIT/ML IJ SOLN
INTRAMUSCULAR | Status: AC
  Filled 2013-12-13: qty 1

## 2013-12-13 MED ORDER — EPOETIN ALFA 10000 UNIT/ML IJ SOLN
10000.0000 [IU] | INTRAMUSCULAR | Status: DC
  Administered 2013-12-13: 10000 [IU] via SUBCUTANEOUS

## 2013-12-18 ENCOUNTER — Ambulatory Visit (INDEPENDENT_AMBULATORY_CARE_PROVIDER_SITE_OTHER): Payer: Medicare Other | Admitting: Pharmacist

## 2013-12-18 DIAGNOSIS — I4891 Unspecified atrial fibrillation: Secondary | ICD-10-CM

## 2013-12-18 LAB — POCT INR: INR: 2.8

## 2013-12-19 ENCOUNTER — Encounter (HOSPITAL_COMMUNITY)
Admission: RE | Admit: 2013-12-19 | Discharge: 2013-12-19 | Disposition: A | Discharge status: Home / Self Care | Payer: Medicare Other | Source: Ambulatory Visit | Attending: Nephrology | Admitting: Nephrology

## 2013-12-19 VITALS — BP 144/58 | HR 67 | Temp 98.2°F

## 2013-12-19 DIAGNOSIS — N184 Chronic kidney disease, stage 4 (severe): Secondary | ICD-10-CM | POA: Diagnosis not present

## 2013-12-19 LAB — POCT HEMOGLOBIN-HEMACUE: HEMOGLOBIN: 11.5 g/dL — AB (ref 12.0–15.0)

## 2013-12-19 MED ORDER — EPOETIN ALFA 10000 UNIT/ML IJ SOLN: 10000.0000 [IU] | INTRAMUSCULAR | Status: DC

## 2013-12-19 MED ORDER — EPOETIN ALFA 10000 UNIT/ML IJ SOLN
INTRAMUSCULAR | Status: AC
  Administered 2013-12-19: 10000 [IU] via SUBCUTANEOUS
  Filled 2013-12-19: qty 1

## 2013-12-25 DIAGNOSIS — S62143A Displaced fracture of body of hamate [unciform] bone, unspecified wrist, initial encounter for closed fracture: Secondary | ICD-10-CM | POA: Diagnosis not present

## 2013-12-26 ENCOUNTER — Encounter (HOSPITAL_COMMUNITY)
Admission: RE | Admit: 2013-12-26 | Discharge: 2013-12-26 | Disposition: A | Discharge status: Home / Self Care | Payer: Medicare Other | Source: Ambulatory Visit | Attending: Nephrology | Admitting: Nephrology

## 2013-12-26 VITALS — BP 145/41 | HR 51 | Temp 98.2°F | Resp 20

## 2013-12-26 DIAGNOSIS — N184 Chronic kidney disease, stage 4 (severe): Secondary | ICD-10-CM | POA: Diagnosis not present

## 2013-12-26 LAB — IRON AND TIBC
IRON: 55 ug/dL (ref 42–135)
Saturation Ratios: 24 % (ref 20–55)
TIBC: 227 ug/dL — ABNORMAL LOW (ref 250–470)
UIBC: 172 ug/dL (ref 125–400)

## 2013-12-26 LAB — FERRITIN: Ferritin: 326 ng/mL — ABNORMAL HIGH (ref 10–291)

## 2013-12-26 LAB — POCT HEMOGLOBIN-HEMACUE: Hemoglobin: 12.2 g/dL (ref 12.0–15.0)

## 2013-12-26 MED ORDER — EPOETIN ALFA 10000 UNIT/ML IJ SOLN
10000.0000 [IU] | INTRAMUSCULAR | Status: DC
Start: 2013-12-26 — End: 2013-12-27

## 2014-01-01 ENCOUNTER — Ambulatory Visit (INDEPENDENT_AMBULATORY_CARE_PROVIDER_SITE_OTHER): Payer: Medicare Other | Admitting: Internal Medicine

## 2014-01-01 DIAGNOSIS — Z7901 Long term (current) use of anticoagulants: Secondary | ICD-10-CM | POA: Diagnosis not present

## 2014-01-01 DIAGNOSIS — I4891 Unspecified atrial fibrillation: Secondary | ICD-10-CM | POA: Diagnosis not present

## 2014-01-01 LAB — POCT INR: INR: 2.4

## 2014-01-09 ENCOUNTER — Encounter (HOSPITAL_COMMUNITY)
Admission: RE | Admit: 2014-01-09 | Discharge: 2014-01-09 | Disposition: A | Discharge status: Home / Self Care | Payer: Medicare Other | Source: Ambulatory Visit | Attending: Nephrology | Admitting: Nephrology

## 2014-01-09 VITALS — BP 135/39 | HR 57 | Temp 98.1°F | Resp 20

## 2014-01-09 DIAGNOSIS — N184 Chronic kidney disease, stage 4 (severe): Secondary | ICD-10-CM | POA: Diagnosis not present

## 2014-01-09 LAB — POCT HEMOGLOBIN-HEMACUE: Hemoglobin: 12.1 g/dL (ref 12.0–15.0)

## 2014-01-09 MED ORDER — EPOETIN ALFA 10000 UNIT/ML IJ SOLN: 10000.0000 [IU] | INTRAMUSCULAR | Status: DC

## 2014-01-17 ENCOUNTER — Other Ambulatory Visit: Payer: Self-pay | Admitting: *Deleted

## 2014-01-17 ENCOUNTER — Ambulatory Visit (INDEPENDENT_AMBULATORY_CARE_PROVIDER_SITE_OTHER): Payer: Medicare Other | Admitting: Cardiology

## 2014-01-17 LAB — POCT INR: INR: 2

## 2014-01-17 MED ORDER — WARFARIN SODIUM 3 MG PO TABS: ORAL_TABLET | ORAL | Status: DC

## 2014-01-23 ENCOUNTER — Other Ambulatory Visit (HOSPITAL_COMMUNITY): Payer: Self-pay | Admitting: *Deleted

## 2014-01-24 ENCOUNTER — Encounter (HOSPITAL_COMMUNITY)
Admission: RE | Admit: 2014-01-24 | Discharge: 2014-01-24 | Disposition: A | Discharge status: Home / Self Care | Payer: Medicare Other | Source: Ambulatory Visit | Attending: Nephrology | Admitting: Nephrology

## 2014-01-24 VITALS — BP 145/38 | HR 60 | Temp 98.0°F | Resp 20

## 2014-01-24 DIAGNOSIS — N184 Chronic kidney disease, stage 4 (severe): Secondary | ICD-10-CM

## 2014-01-24 LAB — POCT HEMOGLOBIN-HEMACUE: Hemoglobin: 10.8 g/dL — ABNORMAL LOW (ref 12.0–15.0)

## 2014-01-24 LAB — IRON AND TIBC
Iron: 59 ug/dL (ref 42–135)
SATURATION RATIOS: 27 % (ref 20–55)
TIBC: 217 ug/dL — AB (ref 250–470)
UIBC: 158 ug/dL (ref 125–400)

## 2014-01-24 LAB — FERRITIN: FERRITIN: 418 ng/mL — AB (ref 10–291)

## 2014-01-24 MED ORDER — EPOETIN ALFA 10000 UNIT/ML IJ SOLN
INTRAMUSCULAR | Status: AC
  Filled 2014-01-24: qty 1

## 2014-01-24 MED ORDER — EPOETIN ALFA 10000 UNIT/ML IJ SOLN
10000.0000 [IU] | INTRAMUSCULAR | Status: DC
  Administered 2014-01-24: 10000 [IU] via SUBCUTANEOUS

## 2014-01-30 ENCOUNTER — Encounter (HOSPITAL_COMMUNITY)
Admission: RE | Admit: 2014-01-30 | Discharge: 2014-01-30 | Disposition: A | Discharge status: Home / Self Care | Payer: Medicare Other | Source: Ambulatory Visit | Attending: Nephrology | Admitting: Nephrology

## 2014-01-30 VITALS — BP 124/74 | HR 67 | Temp 97.8°F

## 2014-01-30 DIAGNOSIS — N184 Chronic kidney disease, stage 4 (severe): Secondary | ICD-10-CM

## 2014-01-30 LAB — POCT HEMOGLOBIN-HEMACUE: Hemoglobin: 11.4 g/dL — ABNORMAL LOW (ref 12.0–15.0)

## 2014-01-30 MED ORDER — EPOETIN ALFA 10000 UNIT/ML IJ SOLN
10000.0000 [IU] | INTRAMUSCULAR | Status: DC
  Administered 2014-01-30: 10000 [IU] via SUBCUTANEOUS

## 2014-01-30 MED ORDER — EPOETIN ALFA 10000 UNIT/ML IJ SOLN
INTRAMUSCULAR | Status: AC
  Filled 2014-01-30: qty 1

## 2014-02-01 LAB — POCT INR: INR: 2

## 2014-02-03 ENCOUNTER — Ambulatory Visit: Payer: Medicare Other | Admitting: Cardiology

## 2014-02-03 ENCOUNTER — Ambulatory Visit (INDEPENDENT_AMBULATORY_CARE_PROVIDER_SITE_OTHER): Payer: Medicare Other | Admitting: Cardiovascular Disease

## 2014-02-06 ENCOUNTER — Encounter (HOSPITAL_COMMUNITY)
Admission: RE | Admit: 2014-02-06 | Discharge: 2014-02-06 | Disposition: A | Discharge status: Home / Self Care | Payer: Medicare Other | Source: Ambulatory Visit | Attending: Nephrology | Admitting: Nephrology

## 2014-02-06 VITALS — BP 148/65 | HR 54 | Temp 97.7°F | Resp 20

## 2014-02-06 DIAGNOSIS — N184 Chronic kidney disease, stage 4 (severe): Secondary | ICD-10-CM | POA: Insufficient documentation

## 2014-02-06 LAB — POCT HEMOGLOBIN-HEMACUE: Hemoglobin: 11.2 g/dL — ABNORMAL LOW (ref 12.0–15.0)

## 2014-02-06 MED ORDER — EPOETIN ALFA 10000 UNIT/ML IJ SOLN
10000.0000 [IU] | INTRAMUSCULAR | Status: DC
  Administered 2014-02-06: 10000 [IU] via SUBCUTANEOUS

## 2014-02-06 MED ORDER — EPOETIN ALFA 10000 UNIT/ML IJ SOLN
INTRAMUSCULAR | Status: AC
  Filled 2014-02-06: qty 1

## 2014-02-10 ENCOUNTER — Ambulatory Visit: Payer: Medicare Other | Admitting: Cardiology

## 2014-02-10 ENCOUNTER — Encounter: Payer: Self-pay | Admitting: Cardiology

## 2014-02-10 DIAGNOSIS — I5032 Chronic diastolic (congestive) heart failure: Secondary | ICD-10-CM | POA: Diagnosis not present

## 2014-02-10 DIAGNOSIS — I482 Chronic atrial fibrillation: Secondary | ICD-10-CM | POA: Diagnosis not present

## 2014-02-10 DIAGNOSIS — N184 Chronic kidney disease, stage 4 (severe): Secondary | ICD-10-CM | POA: Diagnosis not present

## 2014-02-10 DIAGNOSIS — E890 Postprocedural hypothyroidism: Secondary | ICD-10-CM | POA: Diagnosis not present

## 2014-02-10 DIAGNOSIS — I2510 Atherosclerotic heart disease of native coronary artery without angina pectoris: Secondary | ICD-10-CM | POA: Diagnosis not present

## 2014-02-10 DIAGNOSIS — E1122 Type 2 diabetes mellitus with diabetic chronic kidney disease: Secondary | ICD-10-CM | POA: Diagnosis not present

## 2014-02-10 DIAGNOSIS — Z23 Encounter for immunization: Secondary | ICD-10-CM | POA: Diagnosis not present

## 2014-02-10 DIAGNOSIS — R300 Dysuria: Secondary | ICD-10-CM | POA: Diagnosis not present

## 2014-02-10 DIAGNOSIS — M170 Bilateral primary osteoarthritis of knee: Secondary | ICD-10-CM | POA: Diagnosis not present

## 2014-02-13 ENCOUNTER — Ambulatory Visit: Payer: Medicare Other | Admitting: Cardiology

## 2014-02-13 ENCOUNTER — Encounter (HOSPITAL_COMMUNITY)
Admission: RE | Admit: 2014-02-13 | Discharge: 2014-02-13 | Disposition: A | Discharge status: Home / Self Care | Payer: Medicare Other | Source: Ambulatory Visit | Attending: Nephrology | Admitting: Nephrology

## 2014-02-13 VITALS — BP 139/41 | HR 55 | Temp 98.4°F | Resp 20

## 2014-02-13 DIAGNOSIS — N184 Chronic kidney disease, stage 4 (severe): Secondary | ICD-10-CM

## 2014-02-13 LAB — POCT HEMOGLOBIN-HEMACUE: HEMOGLOBIN: 11.4 g/dL — AB (ref 12.0–15.0)

## 2014-02-13 MED ORDER — EPOETIN ALFA 10000 UNIT/ML IJ SOLN
10000.0000 [IU] | INTRAMUSCULAR | Status: DC
  Administered 2014-02-13: 10000 [IU] via SUBCUTANEOUS

## 2014-02-13 MED ORDER — EPOETIN ALFA 10000 UNIT/ML IJ SOLN
INTRAMUSCULAR | Status: AC
  Filled 2014-02-13: qty 1

## 2014-02-17 ENCOUNTER — Ambulatory Visit (INDEPENDENT_AMBULATORY_CARE_PROVIDER_SITE_OTHER): Payer: Medicare Other | Admitting: Internal Medicine

## 2014-02-17 DIAGNOSIS — Z7901 Long term (current) use of anticoagulants: Secondary | ICD-10-CM | POA: Diagnosis not present

## 2014-02-17 DIAGNOSIS — I4891 Unspecified atrial fibrillation: Secondary | ICD-10-CM | POA: Diagnosis not present

## 2014-02-17 LAB — POCT INR: INR: 2.9

## 2014-02-21 ENCOUNTER — Encounter (HOSPITAL_COMMUNITY)
Admission: RE | Admit: 2014-02-21 | Discharge: 2014-02-21 | Disposition: A | Discharge status: Home / Self Care | Payer: Medicare Other | Source: Ambulatory Visit | Attending: Nephrology | Admitting: Nephrology

## 2014-02-21 VITALS — BP 137/56 | HR 59 | Temp 98.2°F | Resp 20

## 2014-02-21 DIAGNOSIS — N184 Chronic kidney disease, stage 4 (severe): Secondary | ICD-10-CM

## 2014-02-21 LAB — IRON AND TIBC
IRON: 63 ug/dL (ref 42–135)
Saturation Ratios: 26 % (ref 20–55)
TIBC: 238 ug/dL — ABNORMAL LOW (ref 250–470)
UIBC: 175 ug/dL (ref 125–400)

## 2014-02-21 LAB — POCT HEMOGLOBIN-HEMACUE: Hemoglobin: 11.9 g/dL — ABNORMAL LOW (ref 12.0–15.0)

## 2014-02-21 LAB — FERRITIN: Ferritin: 368 ng/mL — ABNORMAL HIGH (ref 10–291)

## 2014-02-21 MED ORDER — EPOETIN ALFA 10000 UNIT/ML IJ SOLN
INTRAMUSCULAR | Status: AC
  Filled 2014-02-21: qty 1

## 2014-02-21 MED ORDER — EPOETIN ALFA 10000 UNIT/ML IJ SOLN
10000.0000 [IU] | INTRAMUSCULAR | Status: DC
  Administered 2014-02-21: 10000 [IU] via SUBCUTANEOUS

## 2014-02-27 ENCOUNTER — Other Ambulatory Visit: Payer: Self-pay

## 2014-02-27 MED ORDER — METOLAZONE 2.5 MG PO TABS: ORAL_TABLET | ORAL | Status: DC

## 2014-02-28 ENCOUNTER — Encounter (HOSPITAL_COMMUNITY)
Admission: RE | Admit: 2014-02-28 | Discharge: 2014-02-28 | Disposition: A | Discharge status: Home / Self Care | Payer: Medicare Other | Source: Ambulatory Visit | Attending: Nephrology | Admitting: Nephrology

## 2014-02-28 VITALS — BP 158/54 | HR 54 | Temp 98.2°F | Resp 20

## 2014-02-28 DIAGNOSIS — N184 Chronic kidney disease, stage 4 (severe): Secondary | ICD-10-CM | POA: Diagnosis not present

## 2014-02-28 LAB — POCT HEMOGLOBIN-HEMACUE: Hemoglobin: 11.6 g/dL — ABNORMAL LOW (ref 12.0–15.0)

## 2014-02-28 MED ORDER — EPOETIN ALFA 10000 UNIT/ML IJ SOLN
10000.0000 [IU] | INTRAMUSCULAR | Status: DC
  Administered 2014-02-28: 10000 [IU] via SUBCUTANEOUS

## 2014-02-28 MED ORDER — EPOETIN ALFA 10000 UNIT/ML IJ SOLN
INTRAMUSCULAR | Status: AC
  Filled 2014-02-28: qty 1

## 2014-03-05 ENCOUNTER — Ambulatory Visit (INDEPENDENT_AMBULATORY_CARE_PROVIDER_SITE_OTHER): Payer: Medicare Other | Admitting: Cardiovascular Disease

## 2014-03-05 DIAGNOSIS — I4891 Unspecified atrial fibrillation: Secondary | ICD-10-CM

## 2014-03-05 LAB — POCT INR: INR: 2.8

## 2014-03-07 ENCOUNTER — Encounter (HOSPITAL_COMMUNITY)
Admission: RE | Admit: 2014-03-07 | Discharge: 2014-03-07 | Disposition: A | Discharge status: Home / Self Care | Payer: Medicare Other | Source: Ambulatory Visit | Attending: Nephrology | Admitting: Nephrology

## 2014-03-07 VITALS — BP 135/44 | HR 57 | Temp 98.6°F | Resp 20

## 2014-03-07 DIAGNOSIS — N184 Chronic kidney disease, stage 4 (severe): Secondary | ICD-10-CM | POA: Diagnosis not present

## 2014-03-07 LAB — POCT HEMOGLOBIN-HEMACUE: Hemoglobin: 12.2 g/dL (ref 12.0–15.0)

## 2014-03-07 MED ORDER — EPOETIN ALFA 10000 UNIT/ML IJ SOLN: 10000.0000 [IU] | INTRAMUSCULAR | Status: DC

## 2014-03-19 ENCOUNTER — Ambulatory Visit (INDEPENDENT_AMBULATORY_CARE_PROVIDER_SITE_OTHER): Payer: Medicare Other | Admitting: Cardiology

## 2014-03-19 ENCOUNTER — Encounter: Payer: Self-pay | Admitting: Cardiology

## 2014-03-19 ENCOUNTER — Ambulatory Visit (INDEPENDENT_AMBULATORY_CARE_PROVIDER_SITE_OTHER): Payer: Medicare Other | Admitting: *Deleted

## 2014-03-19 VITALS — BP 146/60 | HR 63 | Ht 62.0 in | Wt 197.0 lb

## 2014-03-19 DIAGNOSIS — I10 Essential (primary) hypertension: Secondary | ICD-10-CM | POA: Diagnosis not present

## 2014-03-19 DIAGNOSIS — I4891 Unspecified atrial fibrillation: Secondary | ICD-10-CM | POA: Diagnosis not present

## 2014-03-19 DIAGNOSIS — I5032 Chronic diastolic (congestive) heart failure: Secondary | ICD-10-CM | POA: Diagnosis not present

## 2014-03-19 DIAGNOSIS — Z7901 Long term (current) use of anticoagulants: Secondary | ICD-10-CM | POA: Insufficient documentation

## 2014-03-19 LAB — POCT INR: INR: 2.6

## 2014-03-19 NOTE — Progress Notes (Signed)
[facility]. [address] [place], [place]  [address] Phone: [phone] Fax:  [phone]  Date:  03/19/2014   ID:  Madeline [name], DOB 09/14/1925, MRN [id number]  PCP:  Abigail Miyamoto, MD   History of Present Illness: Madeline [name] is a 80 y.o. female with chronic diastolic heart failure on occasional metolazone with chronic atrial fibrillation rate controlled, chronic kidney disease stage IV. Had a fall in July 2015. On Coumadin.  Overall doing well. Occasionally will have orthopnea when her weight increases. She knows to try to keep her weight below 200 pounds. She will take an occasional metolazone when this happens. Dr. Leticia Clas has been monitoring her renal function on a yearly basis. She has an appointment coming up in December.  No bleeding, no syncope. No recent falls. Her husband has dementia. Her daughters help out significantly.   Wt Readings from Last 3 Encounters:  03/19/14 197 lb (89.359 kg)  11/17/13 200 lb (90.719 kg)  09/19/13 198 lb (89.812 kg)     Past Medical History  Diagnosis Date  . Hypertension   . Diabetes mellitus   . CHF (congestive heart failure)   . Thyroid disease   . Atrial fibrillation   . Kidney failure   . Anemia 11/07/2012    Past Surgical History  Procedure Laterality Date  . Abdominal hysterectomy    . Thyroid surgery      Current Outpatient Prescriptions  Medication Sig Dispense Refill  . amLODipine (NORVASC) 10 MG tablet Take 10 mg by mouth daily.    . carvedilol (COREG) 6.25 MG tablet Take 6.25 mg by mouth 2 (two) times daily with a meal.    . cholecalciferol (VITAMIN D) 1000 UNITS tablet Take 1,000 Units by mouth daily.    Marland Kitchen docusate sodium 100 MG CAPS Take 100 mg by mouth 2 (two) times daily. OTC 10 capsule 0  . ferrous sulfate 325 (65 FE) MG tablet Take 1 tablet (325 mg total) by mouth 3 (three) times daily with meals.    . furosemide (LASIX) 40 MG tablet Take 3 tablets (120 mg total) by mouth 2 (two) times daily. 180  tablet 0  . glimepiride (AMARYL) 4 MG tablet Take 4 mg by mouth 2 (two) times daily.    Marland Kitchen HYDROcodone-acetaminophen (NORCO/VICODIN) 5-325 MG per tablet Take 1 tablet by mouth every 6 (six) hours as needed for moderate pain. 10 tablet 0  . insulin glargine (LANTUS) 100 UNIT/ML injection Inject 25 Units into the skin at bedtime.     . isosorbide mononitrate (IMDUR) 30 MG 24 hr tablet Take 30 mg by mouth daily.    Marland Kitchen levothyroxine (SYNTHROID, LEVOTHROID) 175 MCG tablet Take 175 mcg by mouth daily. Takes 2 tablets on Sunday.  5  . metolazone (ZAROXOLYN) 2.5 MG tablet Take 2.5 mg once a week if needed 30 tablet 6  . mometasone (ELOCON) 0.1 % cream   0  . nitroGLYCERIN (NITROSTAT) 0.4 MG SL tablet Place 0.4 mg under the tongue every 5 (five) minutes as needed for chest pain.    Marland Kitchen nystatin-triamcinolone (MYCOLOG II) cream Apply 1 application topically 2 (two) times daily.    . pantoprazole (PROTONIX) 40 MG tablet Take 40 mg by mouth 2 (two) times daily.    . polyethylene glycol (MIRALAX / GLYCOLAX) packet Take 17 g by mouth daily as needed. otc 14 each 0  . potassium chloride SA (K-DUR,KLOR-CON) 20 MEQ tablet Take 2 tablets (40 mEq total) by mouth  2 (two) times daily. 120 tablet 0  . pravastatin (PRAVACHOL) 40 MG tablet Take 40 mg by mouth daily.    Marland Kitchen senna (SENOKOT) 8.6 MG TABS Take 2 tablets (17.2 mg total) by mouth daily. otc 120 each 0  . silver sulfADIAZINE (SILVADENE) 1 % cream Apply 1 application topically daily.    . sitaGLIPtin (JANUVIA) 50 MG tablet Take 25 mg by mouth daily. Take half a tablet daily.    . traMADol (ULTRAM) 50 MG tablet Take 1 tablet (50 mg total) by mouth every 6 (six) hours as needed for pain. 30 tablet 0  . warfarin (COUMADIN) 3 MG tablet 2 tablets on all days except 3 tablets on Wednesdays and Saturdays or as directed by Anticoagulation clinic 100 tablet 3   No current facility-administered medications for this visit.    Allergies:   No Known Allergies  Social History:   The patient  reports that she has never smoked. She has never used smokeless tobacco. She reports that she does not drink alcohol or use illicit drugs.   No family history on file. Currently noncontributory family history.  ROS:  Please see the history of present illness.   No chest pain, no syncope, no bleeding, no orthopnea currently.   All other systems reviewed and negative.   PHYSICAL EXAM: VS:  BP 146/60 mmHg  Pulse 63  Ht 5\' 2"  (1.575 m)  Wt 197 lb (89.359 kg)  BMI 36.02 kg/m2 Well nourished, well developed, in no acute distress HEENT: normal, [place]/AT, EOMI Neck: no JVD, normal carotid upstroke, no bruit Cardiac:  normal S1, S2;irregularly irregular; no murmur Lungs:  clear to auscultation bilaterally, no wheezing, rhonchi or rales Abd: soft, nontender, no hepatomegaly, no bruits Ext: no edema, 2+ distal pulses Skin: warm and dry GU: deferred Neuro: no focal abnormalities noted, AAO x 3  EKG:  Atrial fibrillation heart rate 63 with no other abnormalities.    ASSESSMENT AND PLAN:  1. Persistent atrial fibrillation-continue with good rate control. No changes made. 2. Chronic anticoagulation-checking Coumadin today. 3. Chronic diastolic heart failure-maintain weight. Occasional metolazone. No changes made. Fluid, salt restriction. 4. Hypertension-under reasonable control. Medications reviewed. 5. 6 month follow-up.  Signed, Candee Furbish, MD [facility]  03/19/2014 9:41 AM

## 2014-03-19 NOTE — Patient Instructions (Signed)
Your physician recommends that you continue on your current medications as directed. Please refer to the Current Medication list given to you today.  Your physician wants you to follow-up in: 6 months with Dr. Skains. You will receive a reminder letter in the mail two months in advance. If you don't receive a letter, please call our office to schedule the follow-up appointment.  

## 2014-03-21 ENCOUNTER — Encounter (HOSPITAL_COMMUNITY)
Admission: RE | Admit: 2014-03-21 | Discharge: 2014-03-21 | Disposition: A | Discharge status: Home / Self Care | Payer: Medicare Other | Source: Ambulatory Visit | Attending: Nephrology | Admitting: Nephrology

## 2014-03-21 DIAGNOSIS — N184 Chronic kidney disease, stage 4 (severe): Secondary | ICD-10-CM | POA: Insufficient documentation

## 2014-03-21 LAB — IRON AND TIBC
Iron: 55 ug/dL (ref 42–135)
SATURATION RATIOS: 27 % (ref 20–55)
TIBC: 205 ug/dL — ABNORMAL LOW (ref 250–470)
UIBC: 150 ug/dL (ref 125–400)

## 2014-03-21 LAB — POCT HEMOGLOBIN-HEMACUE: HEMOGLOBIN: 12.2 g/dL (ref 12.0–15.0)

## 2014-03-21 LAB — FERRITIN: FERRITIN: 372 ng/mL — AB (ref 10–291)

## 2014-03-21 MED ORDER — EPOETIN ALFA 10000 UNIT/ML IJ SOLN
INTRAMUSCULAR | Status: AC
  Filled 2014-03-21: qty 1

## 2014-03-21 MED ORDER — EPOETIN ALFA 10000 UNIT/ML IJ SOLN: 10000.0000 [IU] | INTRAMUSCULAR | Status: DC

## 2014-04-02 ENCOUNTER — Other Ambulatory Visit (HOSPITAL_COMMUNITY): Payer: Self-pay | Admitting: *Deleted

## 2014-04-02 ENCOUNTER — Ambulatory Visit (INDEPENDENT_AMBULATORY_CARE_PROVIDER_SITE_OTHER): Payer: Medicare Other | Admitting: Internal Medicine

## 2014-04-02 DIAGNOSIS — I4891 Unspecified atrial fibrillation: Secondary | ICD-10-CM

## 2014-04-02 LAB — POCT INR: INR: 3

## 2014-04-04 ENCOUNTER — Encounter (HOSPITAL_COMMUNITY)
Admission: RE | Admit: 2014-04-04 | Discharge: 2014-04-04 | Disposition: A | Discharge status: Home / Self Care | Payer: Medicare Other | Source: Ambulatory Visit | Attending: Nephrology | Admitting: Nephrology

## 2014-04-04 DIAGNOSIS — N184 Chronic kidney disease, stage 4 (severe): Secondary | ICD-10-CM | POA: Diagnosis not present

## 2014-04-04 LAB — POCT HEMOGLOBIN-HEMACUE: Hemoglobin: 10.9 g/dL — ABNORMAL LOW (ref 12.0–15.0)

## 2014-04-04 MED ORDER — EPOETIN ALFA 10000 UNIT/ML IJ SOLN
INTRAMUSCULAR | Status: AC
  Filled 2014-04-04: qty 1

## 2014-04-04 MED ORDER — EPOETIN ALFA 10000 UNIT/ML IJ SOLN
10000.0000 [IU] | INTRAMUSCULAR | Status: DC
  Administered 2014-04-04: 10000 [IU] via SUBCUTANEOUS

## 2014-04-18 ENCOUNTER — Ambulatory Visit (INDEPENDENT_AMBULATORY_CARE_PROVIDER_SITE_OTHER): Payer: Medicare Other | Admitting: Cardiology

## 2014-04-18 DIAGNOSIS — I4891 Unspecified atrial fibrillation: Secondary | ICD-10-CM | POA: Diagnosis not present

## 2014-04-18 DIAGNOSIS — Z7901 Long term (current) use of anticoagulants: Secondary | ICD-10-CM | POA: Diagnosis not present

## 2014-04-18 LAB — POCT INR: INR: 2.4

## 2014-04-24 DIAGNOSIS — N184 Chronic kidney disease, stage 4 (severe): Secondary | ICD-10-CM | POA: Diagnosis not present

## 2014-04-24 DIAGNOSIS — N2581 Secondary hyperparathyroidism of renal origin: Secondary | ICD-10-CM | POA: Diagnosis not present

## 2014-04-24 DIAGNOSIS — N189 Chronic kidney disease, unspecified: Secondary | ICD-10-CM | POA: Diagnosis not present

## 2014-04-24 DIAGNOSIS — D631 Anemia in chronic kidney disease: Secondary | ICD-10-CM | POA: Diagnosis not present

## 2014-05-05 ENCOUNTER — Ambulatory Visit (INDEPENDENT_AMBULATORY_CARE_PROVIDER_SITE_OTHER): Payer: Medicare Other | Admitting: *Deleted

## 2014-05-05 ENCOUNTER — Encounter (HOSPITAL_COMMUNITY)
Admission: RE | Admit: 2014-05-05 | Discharge: 2014-05-05 | Disposition: A | Discharge status: Home / Self Care | Payer: Medicare Other | Source: Ambulatory Visit | Attending: Nephrology | Admitting: Nephrology

## 2014-05-05 DIAGNOSIS — I4891 Unspecified atrial fibrillation: Secondary | ICD-10-CM

## 2014-05-05 DIAGNOSIS — N184 Chronic kidney disease, stage 4 (severe): Secondary | ICD-10-CM | POA: Diagnosis not present

## 2014-05-05 LAB — IRON AND TIBC
Iron: 59 ug/dL (ref 42–145)
Saturation Ratios: 25 % (ref 20–55)
TIBC: 237 ug/dL — ABNORMAL LOW (ref 250–470)
UIBC: 178 ug/dL (ref 125–400)

## 2014-05-05 LAB — FERRITIN: Ferritin: 494 ng/mL — ABNORMAL HIGH (ref 10–291)

## 2014-05-05 LAB — POCT INR: INR: 3

## 2014-05-05 LAB — POCT HEMOGLOBIN-HEMACUE: Hemoglobin: 11.8 g/dL — ABNORMAL LOW (ref 12.0–15.0)

## 2014-05-05 MED ORDER — EPOETIN ALFA 10000 UNIT/ML IJ SOLN
INTRAMUSCULAR | Status: AC
  Administered 2014-05-05: 10000 [IU] via SUBCUTANEOUS
  Filled 2014-05-05: qty 1

## 2014-05-05 MED ORDER — EPOETIN ALFA 10000 UNIT/ML IJ SOLN
10000.0000 [IU] | INTRAMUSCULAR | Status: DC
  Administered 2014-05-05: 10000 [IU] via SUBCUTANEOUS

## 2014-05-19 ENCOUNTER — Ambulatory Visit (INDEPENDENT_AMBULATORY_CARE_PROVIDER_SITE_OTHER): Payer: Medicare Other | Admitting: Pharmacist

## 2014-05-19 DIAGNOSIS — I4891 Unspecified atrial fibrillation: Secondary | ICD-10-CM

## 2014-05-19 LAB — POCT INR: INR: 2.3

## 2014-06-02 ENCOUNTER — Encounter (HOSPITAL_COMMUNITY)
Admission: RE | Admit: 2014-06-02 | Discharge: 2014-06-02 | Disposition: A | Discharge status: Home / Self Care | Payer: Medicare Other | Source: Ambulatory Visit | Attending: Nephrology | Admitting: Nephrology

## 2014-06-02 ENCOUNTER — Ambulatory Visit (INDEPENDENT_AMBULATORY_CARE_PROVIDER_SITE_OTHER): Payer: Medicare Other | Admitting: Cardiology

## 2014-06-02 DIAGNOSIS — N184 Chronic kidney disease, stage 4 (severe): Secondary | ICD-10-CM | POA: Diagnosis not present

## 2014-06-02 DIAGNOSIS — I4891 Unspecified atrial fibrillation: Secondary | ICD-10-CM

## 2014-06-02 LAB — POCT HEMOGLOBIN-HEMACUE: HEMOGLOBIN: 11.9 g/dL — AB (ref 12.0–15.0)

## 2014-06-02 LAB — FERRITIN: Ferritin: 431 ng/mL — ABNORMAL HIGH (ref 10–291)

## 2014-06-02 LAB — IRON AND TIBC
IRON: 62 ug/dL (ref 42–145)
SATURATION RATIOS: 29 % (ref 20–55)
TIBC: 212 ug/dL — AB (ref 250–470)
UIBC: 150 ug/dL (ref 125–400)

## 2014-06-02 LAB — POCT INR: INR: 2.4

## 2014-06-02 MED ORDER — EPOETIN ALFA 10000 UNIT/ML IJ SOLN
10000.0000 [IU] | INTRAMUSCULAR | Status: DC
  Administered 2014-06-02: 10000 [IU] via SUBCUTANEOUS

## 2014-06-02 MED ORDER — EPOETIN ALFA 10000 UNIT/ML IJ SOLN
INTRAMUSCULAR | Status: AC
  Filled 2014-06-02: qty 1

## 2014-06-16 DIAGNOSIS — I4891 Unspecified atrial fibrillation: Secondary | ICD-10-CM | POA: Diagnosis not present

## 2014-06-16 DIAGNOSIS — Z7901 Long term (current) use of anticoagulants: Secondary | ICD-10-CM | POA: Diagnosis not present

## 2014-06-16 LAB — POCT INR: INR: 3.8

## 2014-06-17 ENCOUNTER — Ambulatory Visit (INDEPENDENT_AMBULATORY_CARE_PROVIDER_SITE_OTHER): Payer: Medicare Other | Admitting: Cardiology

## 2014-06-17 DIAGNOSIS — I4891 Unspecified atrial fibrillation: Secondary | ICD-10-CM

## 2014-06-30 ENCOUNTER — Ambulatory Visit (INDEPENDENT_AMBULATORY_CARE_PROVIDER_SITE_OTHER): Payer: Medicare Other | Admitting: Interventional Cardiology

## 2014-06-30 ENCOUNTER — Encounter (HOSPITAL_COMMUNITY)
Admission: RE | Admit: 2014-06-30 | Discharge: 2014-06-30 | Disposition: A | Discharge status: Home / Self Care | Payer: Medicare Other | Source: Ambulatory Visit | Attending: Nephrology | Admitting: Nephrology

## 2014-06-30 DIAGNOSIS — I4891 Unspecified atrial fibrillation: Secondary | ICD-10-CM

## 2014-06-30 DIAGNOSIS — N184 Chronic kidney disease, stage 4 (severe): Secondary | ICD-10-CM | POA: Insufficient documentation

## 2014-06-30 LAB — IRON AND TIBC
Iron: 54 ug/dL (ref 42–145)
SATURATION RATIOS: 25 % (ref 20–55)
TIBC: 217 ug/dL — ABNORMAL LOW (ref 250–470)
UIBC: 163 ug/dL (ref 125–400)

## 2014-06-30 LAB — PROTIME-INR: INR: 2.3 — AB (ref 0.9–1.1)

## 2014-06-30 LAB — FERRITIN: Ferritin: 443 ng/mL — ABNORMAL HIGH (ref 10–291)

## 2014-06-30 LAB — POCT HEMOGLOBIN-HEMACUE: Hemoglobin: 11.9 g/dL — ABNORMAL LOW (ref 12.0–15.0)

## 2014-06-30 MED ORDER — EPOETIN ALFA 10000 UNIT/ML IJ SOLN
10000.0000 [IU] | INTRAMUSCULAR | Status: DC
Start: 2014-06-30 — End: 2014-07-01
  Administered 2014-06-30: 10000 [IU] via SUBCUTANEOUS

## 2014-07-01 MED ORDER — EPOETIN ALFA 10000 UNIT/ML IJ SOLN
INTRAMUSCULAR | Status: AC
  Filled 2014-07-01: qty 1

## 2014-07-14 ENCOUNTER — Ambulatory Visit (INDEPENDENT_AMBULATORY_CARE_PROVIDER_SITE_OTHER): Payer: PRIVATE HEALTH INSURANCE | Admitting: Pharmacist

## 2014-07-14 DIAGNOSIS — I4891 Unspecified atrial fibrillation: Secondary | ICD-10-CM

## 2014-07-14 LAB — POCT INR: INR: 2.8

## 2014-07-28 ENCOUNTER — Ambulatory Visit (INDEPENDENT_AMBULATORY_CARE_PROVIDER_SITE_OTHER): Payer: PRIVATE HEALTH INSURANCE | Admitting: Interventional Cardiology

## 2014-07-28 ENCOUNTER — Encounter (HOSPITAL_COMMUNITY)
Admission: RE | Admit: 2014-07-28 | Discharge: 2014-07-28 | Disposition: A | Discharge status: Home / Self Care | Payer: Medicare Other | Source: Ambulatory Visit | Attending: Nephrology | Admitting: Nephrology

## 2014-07-28 DIAGNOSIS — Z5181 Encounter for therapeutic drug level monitoring: Secondary | ICD-10-CM | POA: Diagnosis not present

## 2014-07-28 DIAGNOSIS — D631 Anemia in chronic kidney disease: Secondary | ICD-10-CM | POA: Insufficient documentation

## 2014-07-28 DIAGNOSIS — N184 Chronic kidney disease, stage 4 (severe): Secondary | ICD-10-CM | POA: Insufficient documentation

## 2014-07-28 DIAGNOSIS — Z79899 Other long term (current) drug therapy: Secondary | ICD-10-CM | POA: Diagnosis not present

## 2014-07-28 DIAGNOSIS — I4891 Unspecified atrial fibrillation: Secondary | ICD-10-CM

## 2014-07-28 LAB — POCT HEMOGLOBIN-HEMACUE: Hemoglobin: 11.5 g/dL — ABNORMAL LOW (ref 12.0–15.0)

## 2014-07-28 LAB — POCT INR: INR: 2.2

## 2014-07-28 MED ORDER — EPOETIN ALFA 10000 UNIT/ML IJ SOLN
INTRAMUSCULAR | Status: AC
  Filled 2014-07-28: qty 1

## 2014-07-28 MED ORDER — EPOETIN ALFA 10000 UNIT/ML IJ SOLN
10000.0000 [IU] | INTRAMUSCULAR | Status: DC
  Administered 2014-07-28: 10000 [IU] via SUBCUTANEOUS

## 2014-07-29 LAB — FERRITIN: FERRITIN: 401 ng/mL — AB (ref 10–291)

## 2014-07-29 LAB — IRON AND TIBC
Iron: 50 ug/dL (ref 42–145)
SATURATION RATIOS: 23 % (ref 20–55)
TIBC: 219 ug/dL — ABNORMAL LOW (ref 250–470)
UIBC: 169 ug/dL (ref 125–400)

## 2014-08-11 ENCOUNTER — Ambulatory Visit (INDEPENDENT_AMBULATORY_CARE_PROVIDER_SITE_OTHER): Payer: PRIVATE HEALTH INSURANCE | Admitting: Cardiology

## 2014-08-11 DIAGNOSIS — I4891 Unspecified atrial fibrillation: Secondary | ICD-10-CM | POA: Diagnosis not present

## 2014-08-11 DIAGNOSIS — M170 Bilateral primary osteoarthritis of knee: Secondary | ICD-10-CM | POA: Diagnosis not present

## 2014-08-11 DIAGNOSIS — I130 Hypertensive heart and chronic kidney disease with heart failure and stage 1 through stage 4 chronic kidney disease, or unspecified chronic kidney disease: Secondary | ICD-10-CM | POA: Diagnosis not present

## 2014-08-11 DIAGNOSIS — E890 Postprocedural hypothyroidism: Secondary | ICD-10-CM | POA: Diagnosis not present

## 2014-08-11 DIAGNOSIS — Z5181 Encounter for therapeutic drug level monitoring: Secondary | ICD-10-CM

## 2014-08-11 DIAGNOSIS — N184 Chronic kidney disease, stage 4 (severe): Secondary | ICD-10-CM | POA: Diagnosis not present

## 2014-08-11 DIAGNOSIS — Z7901 Long term (current) use of anticoagulants: Secondary | ICD-10-CM | POA: Diagnosis not present

## 2014-08-11 DIAGNOSIS — I2510 Atherosclerotic heart disease of native coronary artery without angina pectoris: Secondary | ICD-10-CM | POA: Diagnosis not present

## 2014-08-11 DIAGNOSIS — R109 Unspecified abdominal pain: Secondary | ICD-10-CM | POA: Diagnosis not present

## 2014-08-11 DIAGNOSIS — I5032 Chronic diastolic (congestive) heart failure: Secondary | ICD-10-CM | POA: Diagnosis not present

## 2014-08-11 DIAGNOSIS — E1122 Type 2 diabetes mellitus with diabetic chronic kidney disease: Secondary | ICD-10-CM | POA: Diagnosis not present

## 2014-08-11 LAB — POCT INR: INR: 3.2

## 2014-08-13 ENCOUNTER — Encounter: Payer: Self-pay | Admitting: Cardiology

## 2014-08-17 ENCOUNTER — Other Ambulatory Visit: Payer: Self-pay | Admitting: Cardiology

## 2014-08-25 ENCOUNTER — Encounter (HOSPITAL_COMMUNITY)
Admission: RE | Admit: 2014-08-25 | Discharge: 2014-08-25 | Disposition: A | Discharge status: Home / Self Care | Payer: Medicare Other | Source: Ambulatory Visit | Attending: Nephrology | Admitting: Nephrology

## 2014-08-25 DIAGNOSIS — N184 Chronic kidney disease, stage 4 (severe): Secondary | ICD-10-CM | POA: Diagnosis not present

## 2014-08-25 LAB — POCT HEMOGLOBIN-HEMACUE: Hemoglobin: 10.9 g/dL — ABNORMAL LOW (ref 12.0–15.0)

## 2014-08-25 LAB — POCT INR: INR: 2.2

## 2014-08-25 MED ORDER — EPOETIN ALFA 10000 UNIT/ML IJ SOLN
10000.0000 [IU] | INTRAMUSCULAR | Status: DC
  Administered 2014-08-25: 10000 [IU] via SUBCUTANEOUS

## 2014-08-25 MED ORDER — EPOETIN ALFA 10000 UNIT/ML IJ SOLN
INTRAMUSCULAR | Status: AC
  Filled 2014-08-25: qty 1

## 2014-08-26 ENCOUNTER — Ambulatory Visit (INDEPENDENT_AMBULATORY_CARE_PROVIDER_SITE_OTHER): Payer: PRIVATE HEALTH INSURANCE | Admitting: Interventional Cardiology

## 2014-08-26 DIAGNOSIS — Z5181 Encounter for therapeutic drug level monitoring: Secondary | ICD-10-CM

## 2014-08-26 DIAGNOSIS — I4891 Unspecified atrial fibrillation: Secondary | ICD-10-CM

## 2014-08-26 LAB — IRON AND TIBC
Iron: 40 ug/dL — ABNORMAL LOW (ref 42–145)
Saturation Ratios: 20 % (ref 20–55)
TIBC: 204 ug/dL — AB (ref 250–470)
UIBC: 164 ug/dL (ref 125–400)

## 2014-08-26 LAB — FERRITIN: Ferritin: 453 ng/mL — ABNORMAL HIGH (ref 10–291)

## 2014-09-08 ENCOUNTER — Ambulatory Visit (INDEPENDENT_AMBULATORY_CARE_PROVIDER_SITE_OTHER): Payer: Medicare Other | Admitting: Internal Medicine

## 2014-09-08 DIAGNOSIS — Z5181 Encounter for therapeutic drug level monitoring: Secondary | ICD-10-CM

## 2014-09-08 DIAGNOSIS — I4891 Unspecified atrial fibrillation: Secondary | ICD-10-CM

## 2014-09-08 LAB — POCT INR: INR: 3.2

## 2014-09-16 ENCOUNTER — Encounter: Payer: Self-pay | Admitting: *Deleted

## 2014-09-16 ENCOUNTER — Ambulatory Visit (INDEPENDENT_AMBULATORY_CARE_PROVIDER_SITE_OTHER): Payer: Medicare Other | Admitting: Cardiology

## 2014-09-16 VITALS — BP 124/58 | HR 53 | Ht 62.0 in | Wt 202.0 lb

## 2014-09-16 DIAGNOSIS — I4819 Other persistent atrial fibrillation: Secondary | ICD-10-CM

## 2014-09-16 DIAGNOSIS — Z7901 Long term (current) use of anticoagulants: Secondary | ICD-10-CM

## 2014-09-16 DIAGNOSIS — I481 Persistent atrial fibrillation: Secondary | ICD-10-CM

## 2014-09-16 DIAGNOSIS — I10 Essential (primary) hypertension: Secondary | ICD-10-CM | POA: Diagnosis not present

## 2014-09-16 NOTE — Patient Instructions (Signed)
Medication Instructions:  Your physician recommends that you continue on your current medications as directed. Please refer to the Current Medication list given to you today.  Follow-Up: Follow up in 6 months with Dr. Skains.  You will receive a letter in the mail 2 months before you are due.  Please call us when you receive this letter to schedule your follow up appointment.  Thank you for choosing [facility]!!     

## 2014-09-16 NOTE — Progress Notes (Signed)
[name]. [address] [place], [place]  [address] Phone: [phone] Fax:  [phone]  Date:  09/16/2014   ID:  Madeline [name], DOB Jan 19, 1926, MRN [id number]  PCP:  Abigail Miyamoto, MD   History of Present Illness: Madeline [name] is a 81 y.o. female with chronic diastolic heart failure on occasional metolazone with chronic atrial fibrillation rate controlled, chronic kidney disease stage IV. Had a fall in July 2015. On Coumadin.  Overall doing well. Occasionally will have orthopnea when her weight increases. She knows to try to keep her weight below 200 pounds. She will take an occasional metolazone when this happens, usually once a week. Dr. Justin Mend had been monitoring her renal function on a yearly basis.   No bleeding, no syncope. No recent falls. Her husband died at 38 had dementia. Her daughters help out significantly. Still having knee pain.   Wt Readings from Last 3 Encounters:  09/16/14 202 lb (91.627 kg)  03/19/14 197 lb (89.359 kg)  11/17/13 200 lb (90.719 kg)     Past Medical History  Diagnosis Date  . Hypertension   . Diabetes mellitus   . CHF (congestive heart failure)   . Thyroid disease   . Atrial fibrillation   . Kidney failure   . Anemia 11/07/2012    Past Surgical History  Procedure Laterality Date  . Abdominal hysterectomy    . Thyroid surgery      Current Outpatient Prescriptions  Medication Sig Dispense Refill  . amLODipine (NORVASC) 10 MG tablet Take 10 mg by mouth daily.    . carvedilol (COREG) 6.25 MG tablet Take 6.25 mg by mouth 2 (two) times daily with a meal.    . cholecalciferol (VITAMIN D) 1000 UNITS tablet Take 1,000 Units by mouth daily.    Marland Kitchen docusate sodium 100 MG CAPS Take 100 mg by mouth 2 (two) times daily. OTC 10 capsule 0  . ferrous sulfate 325 (65 FE) MG tablet Take 1 tablet (325 mg total) by mouth 3 (three) times daily with meals.    . furosemide (LASIX) 40 MG tablet Take 3 tablets (120 mg total) by mouth 2 (two) times  daily. 180 tablet 0  . glimepiride (AMARYL) 4 MG tablet Take 4 mg by mouth 2 (two) times daily.    Marland Kitchen HYDROcodone-acetaminophen (NORCO/VICODIN) 5-325 MG per tablet Take 1 tablet by mouth every 6 (six) hours as needed for moderate pain. 10 tablet 0  . isosorbide mononitrate (IMDUR) 30 MG 24 hr tablet Take 30 mg by mouth daily.    Marland Kitchen levothyroxine (SYNTHROID, LEVOTHROID) 175 MCG tablet Take 175 mcg by mouth daily. Takes 2 tablets on Sunday.  5  . metolazone (ZAROXOLYN) 2.5 MG tablet Take 2.5 mg once a week if needed 30 tablet 6  . mometasone (ELOCON) 0.1 % cream   0  . nitroGLYCERIN (NITROSTAT) 0.4 MG SL tablet Place 0.4 mg under the tongue every 5 (five) minutes as needed for chest pain.    Marland Kitchen nystatin-triamcinolone (MYCOLOG II) cream Apply 1 application topically 2 (two) times daily.    . pantoprazole (PROTONIX) 40 MG tablet Take 40 mg by mouth 2 (two) times daily.    . polyethylene glycol (MIRALAX / GLYCOLAX) packet Take 17 g by mouth daily as needed. otc 14 each 0  . potassium chloride SA (K-DUR,KLOR-CON) 20 MEQ tablet Take 2 tablets (40 mEq total) by mouth 2 (two) times daily. 120 tablet 0  . pravastatin (PRAVACHOL) 40 MG tablet Take  40 mg by mouth daily.    Marland Kitchen senna (SENOKOT) 8.6 MG TABS Take 2 tablets (17.2 mg total) by mouth daily. otc 120 each 0  . silver sulfADIAZINE (SILVADENE) 1 % cream Apply 1 application topically daily.    . sitaGLIPtin (JANUVIA) 50 MG tablet Take 25 mg by mouth daily. Take half a tablet daily.    [name] 300 UNIT/ML SOPN 28 mLs daily.    . traMADol (ULTRAM) 50 MG tablet Take 1 tablet (50 mg total) by mouth every 6 (six) hours as needed for pain. 30 tablet 0  . warfarin (COUMADIN) 3 MG tablet TAKE AS DIRECTED 80 tablet 3   No current facility-administered medications for this visit.    Allergies:   No Known Allergies  Social History:  The patient  reports that she has never smoked. She has never used smokeless tobacco. She reports that she does not drink  alcohol or use illicit drugs.   Family History  Problem Relation Age of Onset  . Heart failure Neg Hx   . Heart attack Neg Hx   . Sudden death Neg Hx   . Stroke Neg Hx    Currently noncontributory family history.  ROS:  Please see the history of present illness.   No chest pain, no syncope, no bleeding, no orthopnea currently.   All other systems reviewed and negative.   PHYSICAL EXAM: VS:  BP 124/58 mmHg  Pulse 53  Ht 5\' 2"  (1.575 m)  Wt 202 lb (91.627 kg)  BMI 36.94 kg/m2  SpO2 96% Well nourished, well developed, in no acute distress HEENT: normal, [place]/AT, EOMI Neck: no JVD, normal carotid upstroke, no bruit Cardiac:  normal S1, S2;irregularly irregular; no murmur Lungs:  clear to auscultation bilaterally, no wheezing, rhonchi or rales Abd: soft, nontender, no hepatomegaly, no bruits Ext: no edema, 2+ distal pulses Skin: warm and dry GU: deferred Neuro: no focal abnormalities noted, AAO x 3  EKG:  Atrial fibrillation heart rate 63 with no other abnormalities.    ASSESSMENT AND PLAN:  1. Persistent atrial fibrillation-continue with good rate control. No changes made. Doing well. 2. Chronic anticoagulation-checking Coumadin today. 3. Chronic diastolic heart failure-maintain weight. Occasional metolazone, she states that she takes this once a week. No changes made. Fluid, salt restriction. 4. Hypertension-under reasonable control. Medications reviewed. Weight has been stable. 5. 6 month follow-up.  Signed, Candee Furbish, MD [facility]  09/16/2014 5:00 PM

## 2014-09-22 ENCOUNTER — Encounter (HOSPITAL_COMMUNITY): Payer: PRIVATE HEALTH INSURANCE

## 2014-09-24 ENCOUNTER — Ambulatory Visit (INDEPENDENT_AMBULATORY_CARE_PROVIDER_SITE_OTHER): Payer: Medicare Other | Admitting: Cardiology

## 2014-09-24 ENCOUNTER — Other Ambulatory Visit (HOSPITAL_COMMUNITY): Payer: Self-pay | Admitting: *Deleted

## 2014-09-24 DIAGNOSIS — Z5181 Encounter for therapeutic drug level monitoring: Secondary | ICD-10-CM

## 2014-09-24 DIAGNOSIS — I4819 Other persistent atrial fibrillation: Secondary | ICD-10-CM

## 2014-09-24 DIAGNOSIS — I481 Persistent atrial fibrillation: Secondary | ICD-10-CM

## 2014-09-24 LAB — POCT INR: INR: 2.5

## 2014-09-25 ENCOUNTER — Encounter (HOSPITAL_COMMUNITY)
Admission: RE | Admit: 2014-09-25 | Discharge: 2014-09-25 | Disposition: A | Discharge status: Home / Self Care | Payer: Medicare Other | Source: Ambulatory Visit | Attending: Nephrology | Admitting: Nephrology

## 2014-09-25 DIAGNOSIS — N184 Chronic kidney disease, stage 4 (severe): Secondary | ICD-10-CM | POA: Diagnosis not present

## 2014-09-25 LAB — IRON AND TIBC
Iron: 43 ug/dL (ref 28–170)
SATURATION RATIOS: 18 % (ref 10.4–31.8)
TIBC: 237 ug/dL — AB (ref 250–450)
UIBC: 194 ug/dL

## 2014-09-25 LAB — FERRITIN: Ferritin: 349 ng/mL — ABNORMAL HIGH (ref 11–307)

## 2014-09-25 MED ORDER — EPOETIN ALFA 10000 UNIT/ML IJ SOLN
10000.0000 [IU] | INTRAMUSCULAR | Status: DC
  Administered 2014-09-25: 10000 [IU] via SUBCUTANEOUS

## 2014-09-25 MED ORDER — EPOETIN ALFA 10000 UNIT/ML IJ SOLN
INTRAMUSCULAR | Status: AC
  Filled 2014-09-25: qty 1

## 2014-09-26 LAB — POCT HEMOGLOBIN-HEMACUE: Hemoglobin: 11.2 g/dL — ABNORMAL LOW (ref 12.0–15.0)

## 2014-10-08 ENCOUNTER — Ambulatory Visit (INDEPENDENT_AMBULATORY_CARE_PROVIDER_SITE_OTHER): Payer: Medicare Other | Admitting: Cardiology

## 2014-10-08 ENCOUNTER — Telehealth: Payer: Self-pay | Admitting: Cardiology

## 2014-10-08 DIAGNOSIS — I4819 Other persistent atrial fibrillation: Secondary | ICD-10-CM

## 2014-10-08 DIAGNOSIS — Z5181 Encounter for therapeutic drug level monitoring: Secondary | ICD-10-CM

## 2014-10-08 DIAGNOSIS — I481 Persistent atrial fibrillation: Secondary | ICD-10-CM

## 2014-10-08 DIAGNOSIS — Z7901 Long term (current) use of anticoagulants: Secondary | ICD-10-CM | POA: Diagnosis not present

## 2014-10-08 DIAGNOSIS — I4891 Unspecified atrial fibrillation: Secondary | ICD-10-CM | POA: Diagnosis not present

## 2014-10-08 LAB — POCT INR: INR: 2.5

## 2014-10-08 NOTE — Telephone Encounter (Signed)
Follow Up    Pts daughter is returning a call from earlier today. Please call back.

## 2014-10-20 ENCOUNTER — Encounter (HOSPITAL_COMMUNITY)
Admission: RE | Admit: 2014-10-20 | Discharge: 2014-10-20 | Disposition: A | Discharge status: Home / Self Care | Payer: Medicare Other | Source: Ambulatory Visit | Attending: Nephrology | Admitting: Nephrology

## 2014-10-20 ENCOUNTER — Ambulatory Visit (INDEPENDENT_AMBULATORY_CARE_PROVIDER_SITE_OTHER): Payer: Medicare Other | Admitting: Interventional Cardiology

## 2014-10-20 DIAGNOSIS — I481 Persistent atrial fibrillation: Secondary | ICD-10-CM

## 2014-10-20 DIAGNOSIS — Z5181 Encounter for therapeutic drug level monitoring: Secondary | ICD-10-CM

## 2014-10-20 DIAGNOSIS — I4819 Other persistent atrial fibrillation: Secondary | ICD-10-CM

## 2014-10-20 DIAGNOSIS — N184 Chronic kidney disease, stage 4 (severe): Secondary | ICD-10-CM | POA: Insufficient documentation

## 2014-10-20 LAB — FERRITIN: FERRITIN: 323 ng/mL — AB (ref 11–307)

## 2014-10-20 LAB — IRON AND TIBC
Iron: 49 ug/dL (ref 28–170)
SATURATION RATIOS: 21 % (ref 10.4–31.8)
TIBC: 231 ug/dL — ABNORMAL LOW (ref 250–450)
UIBC: 182 ug/dL

## 2014-10-20 LAB — POCT HEMOGLOBIN-HEMACUE: HEMOGLOBIN: 11 g/dL — AB (ref 12.0–15.0)

## 2014-10-20 LAB — POCT INR: INR: 2

## 2014-10-20 MED ORDER — EPOETIN ALFA 10000 UNIT/ML IJ SOLN
10000.0000 [IU] | INTRAMUSCULAR | Status: DC
  Administered 2014-10-20: 10000 [IU] via SUBCUTANEOUS

## 2014-10-20 MED ORDER — EPOETIN ALFA 10000 UNIT/ML IJ SOLN
INTRAMUSCULAR | Status: AC
  Administered 2014-10-20: 10000 [IU] via SUBCUTANEOUS
  Filled 2014-10-20: qty 1

## 2014-11-05 ENCOUNTER — Ambulatory Visit (INDEPENDENT_AMBULATORY_CARE_PROVIDER_SITE_OTHER): Payer: Medicare Other | Admitting: Cardiology

## 2014-11-05 DIAGNOSIS — Z5181 Encounter for therapeutic drug level monitoring: Secondary | ICD-10-CM

## 2014-11-05 DIAGNOSIS — I481 Persistent atrial fibrillation: Secondary | ICD-10-CM

## 2014-11-05 DIAGNOSIS — I4819 Other persistent atrial fibrillation: Secondary | ICD-10-CM

## 2014-11-05 LAB — POCT INR: INR: 2.4

## 2014-11-17 ENCOUNTER — Encounter (HOSPITAL_COMMUNITY): Payer: Medicare Other

## 2014-11-20 ENCOUNTER — Encounter (HOSPITAL_COMMUNITY)
Admission: RE | Admit: 2014-11-20 | Discharge: 2014-11-20 | Disposition: A | Discharge status: Home / Self Care | Payer: Medicare Other | Source: Ambulatory Visit | Attending: Nephrology | Admitting: Nephrology

## 2014-11-20 ENCOUNTER — Ambulatory Visit (INDEPENDENT_AMBULATORY_CARE_PROVIDER_SITE_OTHER): Payer: Medicare Other | Admitting: Cardiology

## 2014-11-20 DIAGNOSIS — I481 Persistent atrial fibrillation: Secondary | ICD-10-CM

## 2014-11-20 DIAGNOSIS — N184 Chronic kidney disease, stage 4 (severe): Secondary | ICD-10-CM

## 2014-11-20 DIAGNOSIS — I4819 Other persistent atrial fibrillation: Secondary | ICD-10-CM

## 2014-11-20 DIAGNOSIS — Z5181 Encounter for therapeutic drug level monitoring: Secondary | ICD-10-CM

## 2014-11-20 LAB — FERRITIN: FERRITIN: 326 ng/mL — AB (ref 11–307)

## 2014-11-20 LAB — POCT INR: INR: 2.6

## 2014-11-20 LAB — IRON AND TIBC
Iron: 41 ug/dL (ref 28–170)
Saturation Ratios: 19 % (ref 10.4–31.8)
TIBC: 218 ug/dL — ABNORMAL LOW (ref 250–450)
UIBC: 177 ug/dL

## 2014-11-20 LAB — POCT HEMOGLOBIN-HEMACUE: Hemoglobin: 11.2 g/dL — ABNORMAL LOW (ref 12.0–15.0)

## 2014-11-20 MED ORDER — EPOETIN ALFA 10000 UNIT/ML IJ SOLN
10000.0000 [IU] | INTRAMUSCULAR | Status: DC
  Administered 2014-11-20: 10000 [IU] via SUBCUTANEOUS

## 2014-11-20 MED ORDER — EPOETIN ALFA 10000 UNIT/ML IJ SOLN
INTRAMUSCULAR | Status: AC
  Filled 2014-11-20: qty 1

## 2014-11-27 DIAGNOSIS — M67441 Ganglion, right hand: Secondary | ICD-10-CM | POA: Diagnosis not present

## 2014-12-04 DIAGNOSIS — I4891 Unspecified atrial fibrillation: Secondary | ICD-10-CM | POA: Diagnosis not present

## 2014-12-04 DIAGNOSIS — Z7901 Long term (current) use of anticoagulants: Secondary | ICD-10-CM | POA: Diagnosis not present

## 2014-12-04 LAB — POCT INR: INR: 2.1

## 2014-12-05 ENCOUNTER — Ambulatory Visit (INDEPENDENT_AMBULATORY_CARE_PROVIDER_SITE_OTHER): Payer: Medicare Other | Admitting: Internal Medicine

## 2014-12-05 DIAGNOSIS — I4819 Other persistent atrial fibrillation: Secondary | ICD-10-CM

## 2014-12-05 DIAGNOSIS — I481 Persistent atrial fibrillation: Secondary | ICD-10-CM

## 2014-12-05 DIAGNOSIS — Z5181 Encounter for therapeutic drug level monitoring: Secondary | ICD-10-CM

## 2014-12-15 ENCOUNTER — Encounter (HOSPITAL_COMMUNITY)
Admission: RE | Admit: 2014-12-15 | Discharge: 2014-12-15 | Disposition: A | Discharge status: Home / Self Care | Payer: Medicare Other | Source: Ambulatory Visit | Attending: Nephrology | Admitting: Nephrology

## 2014-12-15 DIAGNOSIS — N184 Chronic kidney disease, stage 4 (severe): Secondary | ICD-10-CM | POA: Insufficient documentation

## 2014-12-15 LAB — IRON AND TIBC
Iron: 28 ug/dL (ref 28–170)
Saturation Ratios: 13 % (ref 10.4–31.8)
TIBC: 224 ug/dL — ABNORMAL LOW (ref 250–450)
UIBC: 196 ug/dL

## 2014-12-15 LAB — POCT HEMOGLOBIN-HEMACUE: Hemoglobin: 10.1 g/dL — ABNORMAL LOW (ref 12.0–15.0)

## 2014-12-15 LAB — FERRITIN: Ferritin: 349 ng/mL — ABNORMAL HIGH (ref 11–307)

## 2014-12-15 MED ORDER — EPOETIN ALFA 10000 UNIT/ML IJ SOLN
10000.0000 [IU] | INTRAMUSCULAR | Status: DC
  Administered 2014-12-15: 10000 [IU] via SUBCUTANEOUS

## 2014-12-15 MED ORDER — EPOETIN ALFA 10000 UNIT/ML IJ SOLN
INTRAMUSCULAR | Status: AC
  Filled 2014-12-15: qty 1

## 2014-12-18 ENCOUNTER — Ambulatory Visit (INDEPENDENT_AMBULATORY_CARE_PROVIDER_SITE_OTHER): Payer: Medicare Other | Admitting: Cardiology

## 2014-12-18 DIAGNOSIS — I4819 Other persistent atrial fibrillation: Secondary | ICD-10-CM

## 2014-12-18 DIAGNOSIS — I481 Persistent atrial fibrillation: Secondary | ICD-10-CM

## 2014-12-18 DIAGNOSIS — Z5181 Encounter for therapeutic drug level monitoring: Secondary | ICD-10-CM

## 2014-12-18 LAB — POCT INR: INR: 2.6

## 2015-01-01 ENCOUNTER — Ambulatory Visit (INDEPENDENT_AMBULATORY_CARE_PROVIDER_SITE_OTHER): Payer: Medicare Other | Admitting: Interventional Cardiology

## 2015-01-01 DIAGNOSIS — Z5181 Encounter for therapeutic drug level monitoring: Secondary | ICD-10-CM

## 2015-01-01 DIAGNOSIS — I4819 Other persistent atrial fibrillation: Secondary | ICD-10-CM

## 2015-01-01 DIAGNOSIS — I481 Persistent atrial fibrillation: Secondary | ICD-10-CM

## 2015-01-01 LAB — POCT INR: INR: 1.7

## 2015-01-09 ENCOUNTER — Other Ambulatory Visit (HOSPITAL_COMMUNITY): Payer: Self-pay | Admitting: *Deleted

## 2015-01-09 ENCOUNTER — Other Ambulatory Visit: Payer: Self-pay | Admitting: Cardiology

## 2015-01-13 ENCOUNTER — Encounter (HOSPITAL_COMMUNITY)
Admission: RE | Admit: 2015-01-13 | Discharge: 2015-01-13 | Disposition: A | Discharge status: Home / Self Care | Payer: Medicare Other | Source: Ambulatory Visit | Attending: Nephrology | Admitting: Nephrology

## 2015-01-13 DIAGNOSIS — N184 Chronic kidney disease, stage 4 (severe): Secondary | ICD-10-CM

## 2015-01-13 LAB — IRON AND TIBC
Iron: 41 ug/dL (ref 28–170)
Saturation Ratios: 17 % (ref 10.4–31.8)
TIBC: 248 ug/dL — ABNORMAL LOW (ref 250–450)
UIBC: 207 ug/dL

## 2015-01-13 LAB — POCT HEMOGLOBIN-HEMACUE: HEMOGLOBIN: 11.1 g/dL — AB (ref 12.0–15.0)

## 2015-01-13 LAB — FERRITIN: Ferritin: 298 ng/mL (ref 11–307)

## 2015-01-13 MED ORDER — EPOETIN ALFA 10000 UNIT/ML IJ SOLN
10000.0000 [IU] | INTRAMUSCULAR | Status: DC
  Administered 2015-01-13: 10000 [IU] via SUBCUTANEOUS

## 2015-01-13 MED ORDER — EPOETIN ALFA 10000 UNIT/ML IJ SOLN
INTRAMUSCULAR | Status: AC
  Administered 2015-01-13: 10000 [IU] via SUBCUTANEOUS
  Filled 2015-01-13: qty 1

## 2015-01-13 MED ORDER — SODIUM CHLORIDE 0.9 % IV SOLN
510.0000 mg | INTRAVENOUS | Status: DC
  Administered 2015-01-13: 510 mg via INTRAVENOUS
  Filled 2015-01-13: qty 17

## 2015-01-15 ENCOUNTER — Ambulatory Visit (INDEPENDENT_AMBULATORY_CARE_PROVIDER_SITE_OTHER): Payer: Medicare Other | Admitting: Cardiology

## 2015-01-15 DIAGNOSIS — I481 Persistent atrial fibrillation: Secondary | ICD-10-CM

## 2015-01-15 DIAGNOSIS — Z5181 Encounter for therapeutic drug level monitoring: Secondary | ICD-10-CM

## 2015-01-15 DIAGNOSIS — I4819 Other persistent atrial fibrillation: Secondary | ICD-10-CM

## 2015-01-15 LAB — POCT INR: INR: 2.2

## 2015-01-16 ENCOUNTER — Other Ambulatory Visit (HOSPITAL_COMMUNITY): Payer: Self-pay | Admitting: *Deleted

## 2015-01-19 ENCOUNTER — Encounter (HOSPITAL_COMMUNITY): Payer: Medicare Other

## 2015-01-21 DIAGNOSIS — N390 Urinary tract infection, site not specified: Secondary | ICD-10-CM | POA: Diagnosis not present

## 2015-01-21 DIAGNOSIS — R5383 Other fatigue: Secondary | ICD-10-CM | POA: Diagnosis not present

## 2015-01-22 ENCOUNTER — Encounter (HOSPITAL_BASED_OUTPATIENT_CLINIC_OR_DEPARTMENT_OTHER): Payer: Self-pay

## 2015-01-22 ENCOUNTER — Inpatient Hospital Stay (HOSPITAL_BASED_OUTPATIENT_CLINIC_OR_DEPARTMENT_OTHER)
Admission: EM | Admit: 2015-01-22 | Discharge: 2015-01-26 | DRG: 194 | Disposition: A | Discharge status: Home / Self Care | Payer: Medicare Other | Attending: Internal Medicine | Admitting: Internal Medicine

## 2015-01-22 ENCOUNTER — Emergency Department (HOSPITAL_BASED_OUTPATIENT_CLINIC_OR_DEPARTMENT_OTHER): Payer: Medicare Other

## 2015-01-22 ENCOUNTER — Encounter (HOSPITAL_COMMUNITY): Payer: Medicare Other

## 2015-01-22 DIAGNOSIS — E039 Hypothyroidism, unspecified: Secondary | ICD-10-CM | POA: Diagnosis present

## 2015-01-22 DIAGNOSIS — I129 Hypertensive chronic kidney disease with stage 1 through stage 4 chronic kidney disease, or unspecified chronic kidney disease: Secondary | ICD-10-CM | POA: Diagnosis present

## 2015-01-22 DIAGNOSIS — I482 Chronic atrial fibrillation: Secondary | ICD-10-CM | POA: Diagnosis present

## 2015-01-22 DIAGNOSIS — D649 Anemia, unspecified: Secondary | ICD-10-CM | POA: Diagnosis present

## 2015-01-22 DIAGNOSIS — N289 Disorder of kidney and ureter, unspecified: Secondary | ICD-10-CM | POA: Diagnosis not present

## 2015-01-22 DIAGNOSIS — E119 Type 2 diabetes mellitus without complications: Secondary | ICD-10-CM | POA: Diagnosis not present

## 2015-01-22 DIAGNOSIS — I4891 Unspecified atrial fibrillation: Secondary | ICD-10-CM | POA: Diagnosis present

## 2015-01-22 DIAGNOSIS — N184 Chronic kidney disease, stage 4 (severe): Secondary | ICD-10-CM | POA: Diagnosis not present

## 2015-01-22 DIAGNOSIS — Z794 Long term (current) use of insulin: Secondary | ICD-10-CM | POA: Diagnosis not present

## 2015-01-22 DIAGNOSIS — R05 Cough: Secondary | ICD-10-CM | POA: Diagnosis not present

## 2015-01-22 DIAGNOSIS — I5021 Acute systolic (congestive) heart failure: Secondary | ICD-10-CM | POA: Diagnosis not present

## 2015-01-22 DIAGNOSIS — E785 Hyperlipidemia, unspecified: Secondary | ICD-10-CM | POA: Diagnosis present

## 2015-01-22 DIAGNOSIS — E1122 Type 2 diabetes mellitus with diabetic chronic kidney disease: Secondary | ICD-10-CM | POA: Diagnosis present

## 2015-01-22 DIAGNOSIS — E1129 Type 2 diabetes mellitus with other diabetic kidney complication: Secondary | ICD-10-CM

## 2015-01-22 DIAGNOSIS — I10 Essential (primary) hypertension: Secondary | ICD-10-CM | POA: Diagnosis present

## 2015-01-22 DIAGNOSIS — Z7901 Long term (current) use of anticoagulants: Secondary | ICD-10-CM | POA: Diagnosis not present

## 2015-01-22 DIAGNOSIS — I5032 Chronic diastolic (congestive) heart failure: Secondary | ICD-10-CM | POA: Diagnosis present

## 2015-01-22 DIAGNOSIS — E11649 Type 2 diabetes mellitus with hypoglycemia without coma: Secondary | ICD-10-CM | POA: Diagnosis present

## 2015-01-22 DIAGNOSIS — J189 Pneumonia, unspecified organism: Secondary | ICD-10-CM | POA: Diagnosis not present

## 2015-01-22 DIAGNOSIS — R509 Fever, unspecified: Secondary | ICD-10-CM | POA: Diagnosis not present

## 2015-01-22 DIAGNOSIS — I481 Persistent atrial fibrillation: Secondary | ICD-10-CM | POA: Diagnosis not present

## 2015-01-22 LAB — URINALYSIS, ROUTINE W REFLEX MICROSCOPIC
BILIRUBIN URINE: NEGATIVE
Glucose, UA: NEGATIVE mg/dL
Hgb urine dipstick: NEGATIVE
Ketones, ur: NEGATIVE mg/dL
Leukocytes, UA: NEGATIVE
NITRITE: NEGATIVE
PH: 6.5 (ref 5.0–8.0)
Protein, ur: NEGATIVE mg/dL
SPECIFIC GRAVITY, URINE: 1.009 (ref 1.005–1.030)
Urobilinogen, UA: 0.2 mg/dL (ref 0.0–1.0)

## 2015-01-22 LAB — CBC WITH DIFFERENTIAL/PLATELET
Basophils Absolute: 0 10*3/uL (ref 0.0–0.1)
Basophils Relative: 0 %
EOS ABS: 0.1 10*3/uL (ref 0.0–0.7)
Eosinophils Relative: 1 %
HEMATOCRIT: 35.3 % — AB (ref 36.0–46.0)
HEMOGLOBIN: 11.4 g/dL — AB (ref 12.0–15.0)
LYMPHS ABS: 1.6 10*3/uL (ref 0.7–4.0)
LYMPHS PCT: 15 %
MCH: 31.4 pg (ref 26.0–34.0)
MCHC: 32.3 g/dL (ref 30.0–36.0)
MCV: 97.2 fL (ref 78.0–100.0)
Monocytes Absolute: 1.2 10*3/uL — ABNORMAL HIGH (ref 0.1–1.0)
Monocytes Relative: 11 %
NEUTROS PCT: 73 %
Neutro Abs: 8.1 10*3/uL — ABNORMAL HIGH (ref 1.7–7.7)
Platelets: 202 10*3/uL (ref 150–400)
RBC: 3.63 MIL/uL — AB (ref 3.87–5.11)
RDW: 16.8 % — ABNORMAL HIGH (ref 11.5–15.5)
WBC: 11 10*3/uL — ABNORMAL HIGH (ref 4.0–10.5)

## 2015-01-22 LAB — COMPREHENSIVE METABOLIC PANEL
ALK PHOS: 71 U/L (ref 38–126)
ALT: 13 U/L — AB (ref 14–54)
AST: 20 U/L (ref 15–41)
Albumin: 3.8 g/dL (ref 3.5–5.0)
Anion gap: 10 (ref 5–15)
BILIRUBIN TOTAL: 0.7 mg/dL (ref 0.3–1.2)
BUN: 29 mg/dL — ABNORMAL HIGH (ref 6–20)
CALCIUM: 9.2 mg/dL (ref 8.9–10.3)
CO2: 29 mmol/L (ref 22–32)
Chloride: 97 mmol/L — ABNORMAL LOW (ref 101–111)
Creatinine, Ser: 1.82 mg/dL — ABNORMAL HIGH (ref 0.44–1.00)
GFR calc Af Amer: 28 mL/min — ABNORMAL LOW (ref >60)
GFR calc non Af Amer: 24 mL/min — ABNORMAL LOW (ref >60)
GLUCOSE: 149 mg/dL — AB (ref 65–99)
Potassium: 4 mmol/L (ref 3.5–5.1)
SODIUM: 136 mmol/L (ref 135–145)
Total Protein: 7.7 g/dL (ref 6.5–8.1)

## 2015-01-22 LAB — I-STAT CG4 LACTIC ACID, ED: Lactic Acid, Venous: 1.4 mmol/L (ref 0.5–2.0)

## 2015-01-22 LAB — TROPONIN I

## 2015-01-22 LAB — GLUCOSE, CAPILLARY: GLUCOSE-CAPILLARY: 128 mg/dL — AB (ref 65–99)

## 2015-01-22 LAB — PROTIME-INR
INR: 2.16 — ABNORMAL HIGH (ref 0.00–1.49)
Prothrombin Time: 23.9 seconds — ABNORMAL HIGH (ref 11.6–15.2)

## 2015-01-22 LAB — TSH: TSH: 1.212 u[IU]/mL (ref 0.350–4.500)

## 2015-01-22 MED ORDER — ONDANSETRON HCL 4 MG PO TABS: 4.0000 mg | ORAL_TABLET | Freq: Four times a day (QID) | ORAL | Status: DC | PRN

## 2015-01-22 MED ORDER — AZITHROMYCIN 500 MG IV SOLR
500.0000 mg | INTRAVENOUS | Status: DC
  Administered 2015-01-23 – 2015-01-25 (×3): 500 mg via INTRAVENOUS
  Filled 2015-01-22 (×4): qty 500

## 2015-01-22 MED ORDER — CEFTRIAXONE SODIUM 1 G IJ SOLR
INTRAMUSCULAR | Status: AC
  Filled 2015-01-22: qty 10

## 2015-01-22 MED ORDER — INSULIN ASPART 100 UNIT/ML ~~LOC~~ SOLN
0.0000 [IU] | SUBCUTANEOUS | Status: DC
  Administered 2015-01-23: 2 [IU] via SUBCUTANEOUS
  Administered 2015-01-23: 5 [IU] via SUBCUTANEOUS
  Administered 2015-01-23: 3 [IU] via SUBCUTANEOUS
  Administered 2015-01-23: 5 [IU] via SUBCUTANEOUS
  Administered 2015-01-24: 3 [IU] via SUBCUTANEOUS
  Administered 2015-01-24: 5 [IU] via SUBCUTANEOUS
  Administered 2015-01-24 (×2): 3 [IU] via SUBCUTANEOUS
  Administered 2015-01-25: 2 [IU] via SUBCUTANEOUS

## 2015-01-22 MED ORDER — SODIUM CHLORIDE 0.9 % IV SOLN: 250.0000 mL | INTRAVENOUS | Status: DC | PRN

## 2015-01-22 MED ORDER — HYDROCODONE-ACETAMINOPHEN 5-325 MG PO TABS
1.0000 | ORAL_TABLET | Freq: Four times a day (QID) | ORAL | Status: DC | PRN
  Administered 2015-01-23: 1 via ORAL
  Filled 2015-01-22: qty 1

## 2015-01-22 MED ORDER — DOCUSATE SODIUM 100 MG PO CAPS
100.0000 mg | ORAL_CAPSULE | Freq: Two times a day (BID) | ORAL | Status: DC
  Administered 2015-01-22 – 2015-01-26 (×8): 100 mg via ORAL
  Filled 2015-01-22 (×8): qty 1

## 2015-01-22 MED ORDER — AZITHROMYCIN 500 MG IV SOLR
INTRAVENOUS | Status: AC
  Filled 2015-01-22: qty 500

## 2015-01-22 MED ORDER — SODIUM CHLORIDE 0.9 % IJ SOLN: 3.0000 mL | INTRAMUSCULAR | Status: DC | PRN

## 2015-01-22 MED ORDER — ACETAMINOPHEN 325 MG PO TABS
650.0000 mg | ORAL_TABLET | ORAL | Status: DC | PRN
  Administered 2015-01-22 – 2015-01-25 (×9): 650 mg via ORAL
  Filled 2015-01-22 (×12): qty 2

## 2015-01-22 MED ORDER — FUROSEMIDE 40 MG PO TABS
120.0000 mg | ORAL_TABLET | Freq: Two times a day (BID) | ORAL | Status: DC
  Administered 2015-01-22 – 2015-01-26 (×8): 120 mg via ORAL
  Filled 2015-01-22 (×8): qty 3

## 2015-01-22 MED ORDER — LINAGLIPTIN 5 MG PO TABS
5.0000 mg | ORAL_TABLET | Freq: Every day | ORAL | Status: DC
  Administered 2015-01-23 – 2015-01-26 (×4): 5 mg via ORAL
  Filled 2015-01-22 (×4): qty 1

## 2015-01-22 MED ORDER — CARVEDILOL 6.25 MG PO TABS
6.2500 mg | ORAL_TABLET | Freq: Two times a day (BID) | ORAL | Status: DC
  Administered 2015-01-23 – 2015-01-25 (×6): 6.25 mg via ORAL
  Filled 2015-01-22: qty 2
  Filled 2015-01-22 (×6): qty 1

## 2015-01-22 MED ORDER — INSULIN GLARGINE 100 UNIT/ML ~~LOC~~ SOLN
28.0000 [IU] | Freq: Every day | SUBCUTANEOUS | Status: DC
  Administered 2015-01-22: 28 [IU] via SUBCUTANEOUS
  Filled 2015-01-22 (×2): qty 0.28

## 2015-01-22 MED ORDER — NYSTATIN-TRIAMCINOLONE 100000-0.1 UNIT/GM-% EX CREA
1.0000 "application " | TOPICAL_CREAM | Freq: Two times a day (BID) | CUTANEOUS | Status: DC
  Administered 2015-01-23 – 2015-01-26 (×4): 1 via TOPICAL
  Filled 2015-01-22 (×2): qty 15

## 2015-01-22 MED ORDER — ISOSORBIDE MONONITRATE ER 30 MG PO TB24
30.0000 mg | ORAL_TABLET | Freq: Every day | ORAL | Status: DC
  Administered 2015-01-23 – 2015-01-26 (×4): 30 mg via ORAL
  Filled 2015-01-22 (×4): qty 1

## 2015-01-22 MED ORDER — DEXTROSE 5 % IV SOLN
1.0000 g | Freq: Once | INTRAVENOUS | Status: DC
  Filled 2015-01-22: qty 10

## 2015-01-22 MED ORDER — DEXTROSE 5 % IV SOLN: 1.0000 g | Freq: Once | INTRAVENOUS | Status: DC

## 2015-01-22 MED ORDER — MOMETASONE FUROATE 0.1 % EX CREA
TOPICAL_CREAM | Freq: Every day | CUTANEOUS | Status: DC
  Administered 2015-01-23 – 2015-01-25 (×3): via TOPICAL
  Administered 2015-01-26: 1 via TOPICAL
  Filled 2015-01-22 (×2): qty 15

## 2015-01-22 MED ORDER — ACETAMINOPHEN 650 MG RE SUPP: 650.0000 mg | RECTAL | Status: DC | PRN

## 2015-01-22 MED ORDER — SENNA 8.6 MG PO TABS
2.0000 | ORAL_TABLET | Freq: Every day | ORAL | Status: DC
  Administered 2015-01-23 – 2015-01-26 (×4): 17.2 mg via ORAL
  Filled 2015-01-22 (×4): qty 2

## 2015-01-22 MED ORDER — SODIUM CHLORIDE 0.9 % IV BOLUS (SEPSIS)
1000.0000 mL | Freq: Once | INTRAVENOUS | Status: AC
  Administered 2015-01-22: 1000 mL via INTRAVENOUS

## 2015-01-22 MED ORDER — POLYETHYLENE GLYCOL 3350 17 G PO PACK
17.0000 g | PACK | Freq: Every day | ORAL | Status: DC | PRN
  Filled 2015-01-22: qty 1

## 2015-01-22 MED ORDER — PANTOPRAZOLE SODIUM 40 MG PO TBEC
40.0000 mg | DELAYED_RELEASE_TABLET | Freq: Two times a day (BID) | ORAL | Status: DC
  Administered 2015-01-22 – 2015-01-26 (×8): 40 mg via ORAL
  Filled 2015-01-22 (×8): qty 1

## 2015-01-22 MED ORDER — WARFARIN - PHARMACIST DOSING INPATIENT
Freq: Every day | Status: DC
  Administered 2015-01-23: 18:00:00

## 2015-01-22 MED ORDER — SODIUM CHLORIDE 0.9 % IJ SOLN
3.0000 mL | Freq: Two times a day (BID) | INTRAMUSCULAR | Status: DC
  Administered 2015-01-22 – 2015-01-26 (×6): 3 mL via INTRAVENOUS

## 2015-01-22 MED ORDER — ACETAMINOPHEN 325 MG PO TABS: 650.0000 mg | ORAL_TABLET | Freq: Four times a day (QID) | ORAL | Status: DC | PRN

## 2015-01-22 MED ORDER — ACETAMINOPHEN 650 MG RE SUPP: 650.0000 mg | Freq: Four times a day (QID) | RECTAL | Status: DC | PRN

## 2015-01-22 MED ORDER — PRAVASTATIN SODIUM 40 MG PO TABS
40.0000 mg | ORAL_TABLET | Freq: Every day | ORAL | Status: DC
  Administered 2015-01-22 – 2015-01-26 (×5): 40 mg via ORAL
  Filled 2015-01-22 (×5): qty 1

## 2015-01-22 MED ORDER — VITAMIN D 1000 UNITS PO TABS
1000.0000 [IU] | ORAL_TABLET | Freq: Every day | ORAL | Status: DC
  Administered 2015-01-23 – 2015-01-26 (×4): 1000 [IU] via ORAL
  Filled 2015-01-22 (×4): qty 1

## 2015-01-22 MED ORDER — FERROUS SULFATE 325 (65 FE) MG PO TABS
325.0000 mg | ORAL_TABLET | Freq: Three times a day (TID) | ORAL | Status: DC
  Administered 2015-01-23 – 2015-01-26 (×9): 325 mg via ORAL
  Filled 2015-01-22 (×9): qty 1

## 2015-01-22 MED ORDER — ACETAMINOPHEN 325 MG PO TABS
650.0000 mg | ORAL_TABLET | Freq: Once | ORAL | Status: AC
  Administered 2015-01-22: 650 mg via ORAL
  Filled 2015-01-22: qty 2

## 2015-01-22 MED ORDER — TRAMADOL HCL 50 MG PO TABS: 50.0000 mg | ORAL_TABLET | Freq: Four times a day (QID) | ORAL | Status: DC | PRN

## 2015-01-22 MED ORDER — CEFTRIAXONE SODIUM 1 G IJ SOLR
1.0000 g | INTRAMUSCULAR | Status: DC
  Administered 2015-01-23 – 2015-01-25 (×3): 1 g via INTRAVENOUS
  Filled 2015-01-22 (×5): qty 10

## 2015-01-22 MED ORDER — SODIUM CHLORIDE 0.9 % IJ SOLN
3.0000 mL | Freq: Two times a day (BID) | INTRAMUSCULAR | Status: DC
  Administered 2015-01-22 – 2015-01-26 (×5): 3 mL via INTRAVENOUS

## 2015-01-22 MED ORDER — LEVOTHYROXINE SODIUM 25 MCG PO TABS
175.0000 ug | ORAL_TABLET | Freq: Every day | ORAL | Status: DC
  Administered 2015-01-23 – 2015-01-26 (×4): 175 ug via ORAL
  Filled 2015-01-22 (×8): qty 1

## 2015-01-22 MED ORDER — SILVER SULFADIAZINE 1 % EX CREA
1.0000 "application " | TOPICAL_CREAM | Freq: Every day | CUTANEOUS | Status: DC
  Administered 2015-01-24 – 2015-01-26 (×3): 1 via TOPICAL
  Filled 2015-01-22 (×2): qty 85

## 2015-01-22 MED ORDER — DEXTROSE 5 % IV SOLN
500.0000 mg | Freq: Once | INTRAVENOUS | Status: AC
  Administered 2015-01-22: 16:00:00 via INTRAVENOUS

## 2015-01-22 MED ORDER — WARFARIN SODIUM 6 MG PO TABS
6.0000 mg | ORAL_TABLET | Freq: Once | ORAL | Status: AC
  Administered 2015-01-22: 6 mg via ORAL
  Filled 2015-01-22: qty 1

## 2015-01-22 MED ORDER — ONDANSETRON HCL 4 MG/2ML IJ SOLN: 4.0000 mg | Freq: Four times a day (QID) | INTRAMUSCULAR | Status: DC | PRN

## 2015-01-22 MED ORDER — AMLODIPINE BESYLATE 10 MG PO TABS
10.0000 mg | ORAL_TABLET | Freq: Every day | ORAL | Status: DC
  Administered 2015-01-23 – 2015-01-26 (×4): 10 mg via ORAL
  Filled 2015-01-22 (×4): qty 1

## 2015-01-22 MED ORDER — NITROGLYCERIN 0.4 MG SL SUBL: 0.4000 mg | SUBLINGUAL_TABLET | SUBLINGUAL | Status: DC | PRN

## 2015-01-22 MED ORDER — GLIMEPIRIDE 4 MG PO TABS
4.0000 mg | ORAL_TABLET | Freq: Two times a day (BID) | ORAL | Status: DC
  Administered 2015-01-22 – 2015-01-26 (×8): 4 mg via ORAL
  Filled 2015-01-22 (×8): qty 1

## 2015-01-22 NOTE — ED Notes (Signed)
Pt reports not feeling well x 1 week-fever first noticed yesterday-was seen by PCP yesterday-dx with UTI and started on abx-here for concerns r/t cont'd fever and prod cough

## 2015-01-22 NOTE — H&P (Signed)
[facility] History and Physical  [name] [id number] DOB: 02/20/1926 DOA: 01/22/2015  Referring physician: Ripley Fraise, MD PCP: Abigail Miyamoto, MD   Chief Complaint: Pneumonia  HPI: Madeline [name] is a 81 y.o. female with prior history of Atrial Fibrillation CHADS VASC score 7 HTN DM CHF CKD IV presented to [facility] with complaints of shortness of breath. She had been having increased WOB for about 3-4 days ago. She had a fever yesterday. Overall has been feeling tired and fatigued for the last week. She went to her PCP and was given antibiotic for a UTI. Patient was started on keflex at that time. She states that she did not feel any better and her daughters took her to the ED. She has been having some cough and congestion. She states that she has had some sputum which was yellow. She had no blood in the sputum. She had no chest pain. She had no nausea vomiting or diarrhea. She is a diabetic and does take insulin. Family reports well controlled. She does not smoke. She has not travelled anywhere and she lives at home with her daughter.   Review of Systems:  12 point ROS performed and is unremarkable other than HPI.   Past Medical History  Diagnosis Date  . Hypertension   . Diabetes mellitus   . CHF (congestive heart failure)   . Thyroid disease   . Atrial fibrillation   . Kidney failure   . Anemia 11/07/2012   Past Surgical History  Procedure Laterality Date  . Abdominal hysterectomy    . Thyroid surgery     Social History:  reports that she has never smoked. She has never used smokeless tobacco. She reports that she does not drink alcohol or use illicit drugs.  No Known Allergies  Family History  Problem Relation Age of Onset  . Heart failure Neg Hx   . Heart attack Neg Hx   . Sudden death Neg Hx   . Stroke Neg Hx      Prior to Admission medications   Medication Sig Start Date End Date Taking? Authorizing Provider  cephALEXin (KEFLEX) 500 MG capsule Take  500 mg by mouth 2 (two) times daily.   Yes Historical Provider, MD  amLODipine (NORVASC) 10 MG tablet Take 10 mg by mouth daily.    Historical Provider, MD  carvedilol (COREG) 6.25 MG tablet Take 6.25 mg by mouth 2 (two) times daily with a meal.    Historical Provider, MD  cholecalciferol (VITAMIN D) 1000 UNITS tablet Take 1,000 Units by mouth daily.    Historical Provider, MD  docusate sodium 100 MG CAPS Take 100 mg by mouth 2 (two) times daily. OTC 11/13/12   Eugenie Filler, MD  ferrous sulfate 325 (65 FE) MG tablet Take 1 tablet (325 mg total) by mouth 3 (three) times daily with meals. 11/13/12   Eugenie Filler, MD  furosemide (LASIX) 40 MG tablet Take 3 tablets (120 mg total) by mouth 2 (two) times daily. 11/13/12   Eugenie Filler, MD  glimepiride (AMARYL) 4 MG tablet Take 4 mg by mouth 2 (two) times daily.    Historical Provider, MD  HYDROcodone-acetaminophen (NORCO/VICODIN) 5-325 MG per tablet Take 1 tablet by mouth every 6 (six) hours as needed for moderate pain. 11/17/13   Merryl Hacker, MD  isosorbide mononitrate (IMDUR) 30 MG 24 hr tablet Take 30 mg by mouth daily.    Historical Provider, MD  levothyroxine (SYNTHROID, LEVOTHROID) 175 MCG tablet Take 175  mcg by mouth daily. Takes 2 tablets on Sunday. 02/27/14   Historical Provider, MD  metolazone (ZAROXOLYN) 2.5 MG tablet Take 2.5 mg once a week if needed 02/27/14   Jerline Pain, MD  mometasone (ELOCON) 0.1 % cream  01/14/14   Historical Provider, MD  nitroGLYCERIN (NITROSTAT) 0.4 MG SL tablet Place 0.4 mg under the tongue every 5 (five) minutes as needed for chest pain.    Historical Provider, MD  nystatin-triamcinolone (MYCOLOG II) cream Apply 1 application topically 2 (two) times daily.    Historical Provider, MD  pantoprazole (PROTONIX) 40 MG tablet Take 40 mg by mouth 2 (two) times daily.    Historical Provider, MD  polyethylene glycol (MIRALAX / GLYCOLAX) packet Take 17 g by mouth daily as needed. otc 11/13/12   Eugenie Filler,  MD  potassium chloride SA (K-DUR,KLOR-CON) 20 MEQ tablet Take 2 tablets (40 mEq total) by mouth 2 (two) times daily. 11/13/12   Eugenie Filler, MD  pravastatin (PRAVACHOL) 40 MG tablet Take 40 mg by mouth daily.    Historical Provider, MD  senna (SENOKOT) 8.6 MG TABS Take 2 tablets (17.2 mg total) by mouth daily. otc 11/13/12   Eugenie Filler, MD  silver sulfADIAZINE (SILVADENE) 1 % cream Apply 1 application topically daily.    Historical Provider, MD  sitaGLIPtin (JANUVIA) 50 MG tablet Take 25 mg by mouth daily. Take half a tablet daily.    Historical Provider, MD  TOUJEO SOLOSTAR 300 UNIT/ML SOPN 28 mLs daily. 08/24/14   Historical Provider, MD  traMADol (ULTRAM) 50 MG tablet Take 1 tablet (50 mg total) by mouth every 6 (six) hours as needed for pain. 10/05/12   Orson Eva, MD  warfarin (COUMADIN) 3 MG tablet TAKE AS DIRECTED 01/09/15   Jerline Pain, MD   Physical Exam: Filed Vitals:   01/22/15 1720 01/22/15 1730 01/22/15 1730 01/22/15 1847  BP: 128/40 129/52  151/38  Pulse: 71 62  74  Temp:   100.6 F (38.1 C) 99.5 F (37.5 C)  TempSrc:   Oral Oral  Resp: 27 28  20   Height:      Weight:      SpO2: 91% 91%  96%    Wt Readings from Last 3 Encounters:  01/22/15 89.812 kg (198 lb)  09/16/14 91.627 kg (202 lb)  03/19/14 89.359 kg (197 lb)    General:  Appears calm and comfortable Eyes: PERRL, normal lids, irises & conjunctiva ENT: grossly normal hearing, lips & tongue Neck: no LAD, masses or thyromegaly Cardiovascular: IRR, no m/r/g. No LE edema Respiratory: Coarse BS few crackles Normal respiratory effort. Abdomen: soft, ntnd Skin: no rash or induration seen on limited exam Musculoskeletal: grossly normal tone BUE/BLE Psychiatric: grossly normal mood and affect Neurologic: grossly non-focal.          Labs on Admission:  Basic Metabolic Panel:  Recent Labs Lab 01/22/15 1458  NA 136  K 4.0  CL 97*  CO2 29  GLUCOSE 149*  BUN 29*  CREATININE 1.82*  CALCIUM 9.2    Liver Function Tests:  Recent Labs Lab 01/22/15 1458  AST 20  ALT 13*  ALKPHOS 71  BILITOT 0.7  PROT 7.7  ALBUMIN 3.8   No results for input(s): LIPASE, AMYLASE in the last 168 hours. No results for input(s): AMMONIA in the last 168 hours. CBC:  Recent Labs Lab 01/22/15 1458  WBC 11.0*  NEUTROABS 8.1*  HGB 11.4*  HCT 35.3*  MCV 97.2  PLT 202  Cardiac Enzymes: No results for input(s): CKTOTAL, CKMB, CKMBINDEX, TROPONINI in the last 168 hours.  BNP (last 3 results) No results for input(s): BNP in the last 8760 hours.  ProBNP (last 3 results) No results for input(s): PROBNP in the last 8760 hours.  CBG: No results for input(s): GLUCAP in the last 168 hours.  Radiological Exams on Admission: Dg Chest 2 View  01/22/2015   CLINICAL DATA:  Sick for 1 week fever yesterday productive cough  EXAM: CHEST  2 VIEW  COMPARISON:  12/11/2012  FINDINGS: Stable mild cardiac enlargement. 4 cm area of infiltrate right middle lobe. No pleural effusion.  IMPRESSION: Right middle lobe infiltrate consistent with pneumonia. Followup PA and lateral chest X-ray is recommended in 3-4 weeks following trial of antibiotic therapy to ensure resolution and exclude underlying malignancy.   Electronically Signed   By: Skipper Cliche M.D.   On: 01/22/2015 15:15      Assessment/Plan Principal Problem:   CAP (community acquired pneumonia) Active Problems:   Hypothyroidism   CKD (chronic kidney disease) stage 4, GFR 15-29 ml/min   DM2 (diabetes mellitus, type 2)   Atrial fibrillation   Anemia   Essential hypertension   1. CAP -start on coverage with rocephin and azithromycin -will get sputum cultures -will check urine strep ag and legionella ag -CXR shows presence of RML infiltrate and also has some increased pulmonary vascular congestion  2. DM Type II -will monitor A1C -will monitor FSBS and provide SSI as needed -continue with insulin amaryl and tradjenta  3. CKD IV -current  creatinine appears to be near baseline 1.5-1.9 -will monitor labs  4. Anemia -Hgb is at baseline  5. HTN -monitor pressures -patient is on coreg lasix amlodipine  6. Hypothyroidism -will check TSH  7. Atrial Fibrillation -ECG shows atrial fibrillation rate controlled -on coumadin will continue pharmacy consult  8. HLD -will continue with pravachol  9. CHF -continue with coreg imdur  -EF 55-60% in 2014 repeat echo    Code Status: FUll Code (must indicate code status--if unknown or must be presumed, indicate so) DVT Prophylaxis:Coumadin Family Communication: none (indicate person spoken with, if applicable, with phone number if by telephone) Disposition Plan: home (indicate anticipated LOS)   [facility] Pager [phone]

## 2015-01-22 NOTE — ED Notes (Signed)
Attempt to call report nurse not available 

## 2015-01-22 NOTE — ED Notes (Signed)
Patient transported to X-ray 

## 2015-01-22 NOTE — Progress Notes (Signed)
ANTICOAGULATION CONSULT NOTE - Initial Consult  Pharmacy Consult for Coumadin Indication: atrial fibrillation  No Known Allergies  Patient Measurements: Height: 5\' 1"  (154.9 cm) Weight: 198 lb (89.812 kg) IBW/kg (Calculated) : 47.8  Vital Signs: Temp: 99.5 F (37.5 C) (09/15 1847) Temp Source: Oral (09/15 1847) BP: 151/38 mmHg (09/15 1847) Pulse Rate: 74 (09/15 1847)  Labs:  Recent Labs  01/22/15 1458  HGB 11.4*  HCT 35.3*  PLT 202  LABPROT 23.9*  INR 2.16*  CREATININE 1.82*    Estimated Creatinine Clearance: 22.2 mL/min (by C-G formula based on Cr of 1.82).   Medical History: Past Medical History  Diagnosis Date  . Hypertension   . Diabetes mellitus   . CHF (congestive heart failure)   . Thyroid disease   . Atrial fibrillation   . Kidney failure   . Anemia 11/07/2012    Medications:   Home Coumadin dose = 6mg  daily except 9mg  on Weds/Sat - has 3mg  tabs.  Assessment: 81 yo F who presented to her PCP 9/14 with generalized weakness and fatigue and was prescribed Keflex empirically for UTI. Pt did not improve and went to [facility] on 9/15 where CXR showed PNA and started on Roceph/Azith for CAP.  Pt is on Coumadin PTA for hx of afib and pharmacy has been consulted to resume.  INR on admission is therapeutic.  Goal of Therapy:  INR 2-3 Monitor platelets by anticoagulation protocol: Yes   Plan:  Coumadin 6 mg PO x 1 tonight. Daily INR  [employer], Pharm.D., BCPS Clinical Pharmacist Pager [phone] 01/22/2015 7:55 PM

## 2015-01-22 NOTE — Progress Notes (Signed)
Patient had fever, generalized weakness and fatigue, saw her PCP yesterday,  treated empirically on Keflex for presumed UTI, no improvement of her symptoms, presents to [facility], chest x-ray showing opacity, started on Rocephin and azithromycin for community-acquired pneumonia, patient accepted to medical bed, chronic kidney disease at baseline. Phillips Climes MD

## 2015-01-22 NOTE — ED Notes (Signed)
MD at bedside. 

## 2015-01-22 NOTE — ED Provider Notes (Signed)
CSN: [id number]     Arrival date & time 01/22/15  1410 History   First MD Initiated Contact with Patient 01/22/15 1506     Chief Complaint  Patient presents with  . Fever     Patient is a 81 y.o. female presenting with fever. The history is provided by the patient and a relative.  Fever Severity:  Moderate Onset quality:  Gradual Duration:  1 day Timing:  Constant Progression:  Worsening Chronicity:  New Relieved by:  Nothing Worsened by:  Nothing tried Associated symptoms: cough and nausea   Associated symptoms: no diarrhea, no headaches and no vomiting    Pt has felt weak for one week - she describes fatigue and "no energy" She reports a fever started yesterday - seen by PCP and started on keflex for UTI  Past Medical History  Diagnosis Date  . Hypertension   . Diabetes mellitus   . CHF (congestive heart failure)   . Thyroid disease   . Atrial fibrillation   . Kidney failure   . Anemia 11/07/2012   Past Surgical History  Procedure Laterality Date  . Abdominal hysterectomy    . Thyroid surgery     Family History  Problem Relation Age of Onset  . Heart failure Neg Hx   . Heart attack Neg Hx   . Sudden death Neg Hx   . Stroke Neg Hx    Social History  Substance Use Topics  . Smoking status: Never Smoker   . Smokeless tobacco: Never Used  . Alcohol Use: No   OB History    No data available     Review of Systems  Constitutional: Positive for fever and fatigue.  Respiratory: Positive for cough.   Gastrointestinal: Positive for nausea. Negative for vomiting, abdominal pain and diarrhea.  Neurological: Negative for speech difficulty and headaches.  All other systems reviewed and are negative.     Allergies  Review of patient's allergies indicates no known allergies.  Home Medications   Prior to Admission medications   Medication Sig Start Date End Date Taking? Authorizing Provider  cephALEXin (KEFLEX) 500 MG capsule Take 500 mg by mouth 2 (two) times  daily.   Yes Historical Provider, MD  amLODipine (NORVASC) 10 MG tablet Take 10 mg by mouth daily.    Historical Provider, MD  carvedilol (COREG) 6.25 MG tablet Take 6.25 mg by mouth 2 (two) times daily with a meal.    Historical Provider, MD  cholecalciferol (VITAMIN D) 1000 UNITS tablet Take 1,000 Units by mouth daily.    Historical Provider, MD  docusate sodium 100 MG CAPS Take 100 mg by mouth 2 (two) times daily. OTC 11/13/12   Eugenie Filler, MD  ferrous sulfate 325 (65 FE) MG tablet Take 1 tablet (325 mg total) by mouth 3 (three) times daily with meals. 11/13/12   Eugenie Filler, MD  furosemide (LASIX) 40 MG tablet Take 3 tablets (120 mg total) by mouth 2 (two) times daily. 11/13/12   Eugenie Filler, MD  glimepiride (AMARYL) 4 MG tablet Take 4 mg by mouth 2 (two) times daily.    Historical Provider, MD  HYDROcodone-acetaminophen (NORCO/VICODIN) 5-325 MG per tablet Take 1 tablet by mouth every 6 (six) hours as needed for moderate pain. 11/17/13   Merryl Hacker, MD  isosorbide mononitrate (IMDUR) 30 MG 24 hr tablet Take 30 mg by mouth daily.    Historical Provider, MD  levothyroxine (SYNTHROID, LEVOTHROID) 175 MCG tablet Take 175 mcg by mouth  daily. Takes 2 tablets on Sunday. 02/27/14   Historical Provider, MD  metolazone (ZAROXOLYN) 2.5 MG tablet Take 2.5 mg once a week if needed 02/27/14   Jerline Pain, MD  mometasone (ELOCON) 0.1 % cream  01/14/14   Historical Provider, MD  nitroGLYCERIN (NITROSTAT) 0.4 MG SL tablet Place 0.4 mg under the tongue every 5 (five) minutes as needed for chest pain.    Historical Provider, MD  nystatin-triamcinolone (MYCOLOG II) cream Apply 1 application topically 2 (two) times daily.    Historical Provider, MD  pantoprazole (PROTONIX) 40 MG tablet Take 40 mg by mouth 2 (two) times daily.    Historical Provider, MD  polyethylene glycol (MIRALAX / GLYCOLAX) packet Take 17 g by mouth daily as needed. otc 11/13/12   Eugenie Filler, MD  potassium chloride SA  (K-DUR,KLOR-CON) 20 MEQ tablet Take 2 tablets (40 mEq total) by mouth 2 (two) times daily. 11/13/12   Eugenie Filler, MD  pravastatin (PRAVACHOL) 40 MG tablet Take 40 mg by mouth daily.    Historical Provider, MD  senna (SENOKOT) 8.6 MG TABS Take 2 tablets (17.2 mg total) by mouth daily. otc 11/13/12   Eugenie Filler, MD  silver sulfADIAZINE (SILVADENE) 1 % cream Apply 1 application topically daily.    Historical Provider, MD  sitaGLIPtin (JANUVIA) 50 MG tablet Take 25 mg by mouth daily. Take half a tablet daily.    Historical Provider, MD  TOUJEO SOLOSTAR 300 UNIT/ML SOPN 28 mLs daily. 08/24/14   Historical Provider, MD  traMADol (ULTRAM) 50 MG tablet Take 1 tablet (50 mg total) by mouth every 6 (six) hours as needed for pain. 10/05/12   Orson Eva, MD  warfarin (COUMADIN) 3 MG tablet TAKE AS DIRECTED 01/09/15   Jerline Pain, MD   BP 151/48 mmHg  Pulse 81  Temp(Src) 99.9 F (37.7 C) (Oral)  Resp 24  Ht 5\' 1"  (1.549 m)  Wt 198 lb (89.812 kg)  BMI 37.43 kg/m2  SpO2 93% Physical Exam CONSTITUTIONAL: Well developed/well nourished HEAD: Normocephalic/atraumatic EYES: EOMI ENMT: Mucous membranes moist NECK: supple no meningeal signs SPINE/BACK:entire spine nontender CV: S1/S2 noted, no murmurs/rubs/gallops noted LUNGS: crackles to left base, no apparent distress ABDOMEN: soft, nontender, no rebound or guarding, bowel sounds noted throughout abdomen GU:no cva tenderness NEURO: Pt is awake/alert/appropriate, moves all extremitiesx4.  No facial droop.   EXTREMITIES: pulses normal/equal, full ROM SKIN: warm, color normal PSYCH: no abnormalities of mood noted, alert and oriented to situation  ED Course  Procedures  4:06 PM Pt with pneumonia She is not septic but would benefit from admission D/w dr Roseanne Kaufman will admit BP 153/45 mmHg  Pulse 59  Temp(Src) 100.5 F (38.1 C) (Oral)  Resp 24  Ht 5\' 1"  (1.549 m)  Wt 198 lb (89.812 kg)  BMI 37.43 kg/m2  SpO2 93%  Labs Review Labs  Reviewed  CBC WITH DIFFERENTIAL/PLATELET - Abnormal; Notable for the following:    WBC 11.0 (*)    RBC 3.63 (*)    Hemoglobin 11.4 (*)    HCT 35.3 (*)    RDW 16.8 (*)    Neutro Abs 8.1 (*)    Monocytes Absolute 1.2 (*)    All other components within normal limits  COMPREHENSIVE METABOLIC PANEL - Abnormal; Notable for the following:    Chloride 97 (*)    Glucose, Bld 149 (*)    BUN 29 (*)    Creatinine, Ser 1.82 (*)    ALT 13 (*)  GFR calc non Af Amer 24 (*)    GFR calc Af Amer 28 (*)    All other components within normal limits  PROTIME-INR - Abnormal; Notable for the following:    Prothrombin Time 23.9 (*)    INR 2.16 (*)    All other components within normal limits  CULTURE, BLOOD (ROUTINE X 2)  CULTURE, BLOOD (ROUTINE X 2)  URINALYSIS, ROUTINE W REFLEX MICROSCOPIC (NOT AT [facility])  I-STAT CG4 LACTIC ACID, ED    Imaging Review Dg Chest 2 View  01/22/2015   CLINICAL DATA:  Sick for 1 week fever yesterday productive cough  EXAM: CHEST  2 VIEW  COMPARISON:  12/11/2012  FINDINGS: Stable mild cardiac enlargement. 4 cm area of infiltrate right middle lobe. No pleural effusion.  IMPRESSION: Right middle lobe infiltrate consistent with pneumonia. Followup PA and lateral chest X-ray is recommended in 3-4 weeks following trial of antibiotic therapy to ensure resolution and exclude underlying malignancy.   Electronically Signed   By: Skipper Cliche M.D.   On: 01/22/2015 15:15   I have personally reviewed and evaluated these images and lab results as part of my medical decision-making.   EKG Interpretation   Date/Time:  Thursday January 22 2015 15:12:08 EDT Ventricular Rate:  72 PR Interval:    QRS Duration: 90 QT Interval:  402 QTC Calculation: 440 R Axis:   68 Text Interpretation:  Atrial fibrillation Possible Anterior infarct , age  undetermined Abnormal ECG No significant change since last tracing  Confirmed by Christy Gentles  MD, [place] ([id number]) on 01/22/2015 3:31:35 PM      Medications  cefTRIAXone (ROCEPHIN) 1 g in dextrose 5 % 50 mL IVPB (not administered)  azithromycin (ZITHROMAX) 500 mg in dextrose 5 % 250 mL IVPB ( Intravenous New Bag/Given 01/22/15 1547)  azithromycin (ZITHROMAX) 500 MG injection (not administered)  cefTRIAXone (ROCEPHIN) 1 G injection (not administered)  acetaminophen (TYLENOL) tablet 650 mg (not administered)  sodium chloride 0.9 % bolus 1,000 mL (1,000 mLs Intravenous New Bag/Given 01/22/15 1459)    MDM   Final diagnoses:  CAP (community acquired pneumonia)  Renal insufficiency    Nursing notes including past medical history and social history reviewed and considered in documentation xrays/imaging reviewed by myself and considered during evaluation Labs/vital reviewed myself and considered during evaluation     Ripley Fraise, MD 01/22/15 1606

## 2015-01-22 NOTE — ED Notes (Signed)
Report given to [place]

## 2015-01-23 ENCOUNTER — Inpatient Hospital Stay (HOSPITAL_COMMUNITY): Payer: Medicare Other

## 2015-01-23 DIAGNOSIS — J189 Pneumonia, unspecified organism: Principal | ICD-10-CM

## 2015-01-23 DIAGNOSIS — I10 Essential (primary) hypertension: Secondary | ICD-10-CM

## 2015-01-23 DIAGNOSIS — D649 Anemia, unspecified: Secondary | ICD-10-CM

## 2015-01-23 DIAGNOSIS — E039 Hypothyroidism, unspecified: Secondary | ICD-10-CM

## 2015-01-23 DIAGNOSIS — E119 Type 2 diabetes mellitus without complications: Secondary | ICD-10-CM

## 2015-01-23 DIAGNOSIS — I5021 Acute systolic (congestive) heart failure: Secondary | ICD-10-CM

## 2015-01-23 DIAGNOSIS — N184 Chronic kidney disease, stage 4 (severe): Secondary | ICD-10-CM

## 2015-01-23 LAB — GLUCOSE, CAPILLARY
GLUCOSE-CAPILLARY: 109 mg/dL — AB (ref 65–99)
GLUCOSE-CAPILLARY: 195 mg/dL — AB (ref 65–99)
GLUCOSE-CAPILLARY: 233 mg/dL — AB (ref 65–99)
GLUCOSE-CAPILLARY: 173 mg/dL — AB (ref 65–99)
Glucose-Capillary: 138 mg/dL — ABNORMAL HIGH (ref 65–99)
Glucose-Capillary: 213 mg/dL — ABNORMAL HIGH (ref 65–99)
Glucose-Capillary: 65 mg/dL (ref 65–99)

## 2015-01-23 LAB — COMPREHENSIVE METABOLIC PANEL
ALBUMIN: 3 g/dL — AB (ref 3.5–5.0)
ALT: 11 U/L — AB (ref 14–54)
AST: 19 U/L (ref 15–41)
Alkaline Phosphatase: 55 U/L (ref 38–126)
Anion gap: 12 (ref 5–15)
BILIRUBIN TOTAL: 0.6 mg/dL (ref 0.3–1.2)
BUN: 31 mg/dL — AB (ref 6–20)
CHLORIDE: 97 mmol/L — AB (ref 101–111)
CO2: 26 mmol/L (ref 22–32)
Calcium: 8.5 mg/dL — ABNORMAL LOW (ref 8.9–10.3)
Creatinine, Ser: 1.93 mg/dL — ABNORMAL HIGH (ref 0.44–1.00)
GFR calc Af Amer: 26 mL/min — ABNORMAL LOW (ref >60)
GFR calc non Af Amer: 22 mL/min — ABNORMAL LOW (ref >60)
GLUCOSE: 231 mg/dL — AB (ref 65–99)
POTASSIUM: 3.1 mmol/L — AB (ref 3.5–5.1)
Sodium: 135 mmol/L (ref 135–145)
Total Protein: 6.6 g/dL (ref 6.5–8.1)

## 2015-01-23 LAB — CBC
HEMATOCRIT: 31.8 % — AB (ref 36.0–46.0)
Hemoglobin: 10.3 g/dL — ABNORMAL LOW (ref 12.0–15.0)
MCH: 31.1 pg (ref 26.0–34.0)
MCHC: 32.4 g/dL (ref 30.0–36.0)
MCV: 96.1 fL (ref 78.0–100.0)
Platelets: 197 10*3/uL (ref 150–400)
RBC: 3.31 MIL/uL — ABNORMAL LOW (ref 3.87–5.11)
RDW: 16.9 % — AB (ref 11.5–15.5)
WBC: 11.2 10*3/uL — ABNORMAL HIGH (ref 4.0–10.5)

## 2015-01-23 LAB — HEMOGLOBIN A1C
HEMOGLOBIN A1C: 7 % — AB (ref 4.8–5.6)
HEMOGLOBIN A1C: 7.3 % — AB (ref 4.8–5.6)
MEAN PLASMA GLUCOSE: 163 mg/dL
Mean Plasma Glucose: 154 mg/dL

## 2015-01-23 LAB — TROPONIN I
Troponin I: 0.05 ng/mL — ABNORMAL HIGH (ref <0.031)
Troponin I: 0.03 ng/mL (ref <0.031)

## 2015-01-23 LAB — APTT: aPTT: 55 seconds — ABNORMAL HIGH (ref 24–37)

## 2015-01-23 LAB — PROTIME-INR
INR: 2.27 — ABNORMAL HIGH (ref 0.00–1.49)
Prothrombin Time: 24.8 seconds — ABNORMAL HIGH (ref 11.6–15.2)

## 2015-01-23 LAB — STREP PNEUMONIAE URINARY ANTIGEN: Strep Pneumo Urinary Antigen: NEGATIVE

## 2015-01-23 MED ORDER — WARFARIN SODIUM 5 MG PO TABS
5.0000 mg | ORAL_TABLET | Freq: Once | ORAL | Status: AC
Start: 2015-01-23 — End: 2015-01-23
  Administered 2015-01-23: 5 mg via ORAL
  Filled 2015-01-23: qty 1

## 2015-01-23 MED ORDER — INSULIN GLARGINE 100 UNIT/ML ~~LOC~~ SOLN
28.0000 [IU] | Freq: Every day | SUBCUTANEOUS | Status: DC
  Administered 2015-01-23 – 2015-01-25 (×3): 28 [IU] via SUBCUTANEOUS
  Filled 2015-01-23 (×4): qty 0.28

## 2015-01-23 MED ORDER — POTASSIUM CHLORIDE CRYS ER 20 MEQ PO TBCR
40.0000 meq | EXTENDED_RELEASE_TABLET | Freq: Two times a day (BID) | ORAL | Status: DC
  Administered 2015-01-23 – 2015-01-26 (×7): 40 meq via ORAL
  Filled 2015-01-23 (×7): qty 2

## 2015-01-23 NOTE — Progress Notes (Signed)
ANTICOAGULATION CONSULT NOTE -Follow up  Pharmacy Consult for Coumadin Indication: atrial fibrillation  No Known Allergies  Patient Measurements: Height: 5\' 1"  (154.9 cm) Weight: 202 lb 6.1 oz (91.8 kg) IBW/kg (Calculated) : 47.8  Vital Signs: Temp: 98.1 F (36.7 C) (09/16 1227) Temp Source: Oral (09/16 0810) BP: 138/43 mmHg (09/16 1227) Pulse Rate: 58 (09/16 1227)  Labs:  Recent Labs  01/22/15 1458 01/22/15 2011 01/23/15 0140 01/23/15 0720  HGB 11.4*  --   --  10.3*  HCT 35.3*  --   --  31.8*  PLT 202  --   --  197  APTT  --   --   --  55*  LABPROT 23.9*  --  24.8*  --   INR 2.16*  --  2.27*  --   CREATININE 1.82*  --   --  1.93*  TROPONINI  --  <0.03 0.05* 0.03    Estimated Creatinine Clearance: 21.2 mL/min (by C-G formula based on Cr of 1.93).   Medical History: Past Medical History  Diagnosis Date  . Hypertension   . Diabetes mellitus   . CHF (congestive heart failure)   . Thyroid disease   . Atrial fibrillation   . Kidney failure   . Anemia 11/07/2012    Medications:  Home Coumadin dose = 6mg  daily except 9mg  on Weds/Sat - has 3mg  tabs. Last taken PTA on 01/21/15  Assessment: 81 yo F who presented to her PCP 9/14 with generalized weakness and fatigue and was prescribed Keflex empirically for UTI. Pt did not improve and went to [facility] on 9/15 where CXR showed PNA and started on Roceph/Azithromycin  for CAP.   Pt is on Coumadin PTA for hx of afib and pharmacy was been consulted to resume.  INR on admission was therapeutic. Today the INR is 2.27, remains therapeutic.  Hgb 10.3, pltc 197K  No bleeding noted.  Azithromycin may increase coumadin effect thus will decrase dose to 5mg  today.   Goal of Therapy:  INR 2-3 Monitor platelets by anticoagulation protocol: Yes   Plan:  Coumadin 5 mg PO x 1 tonight. (lower dose due to possible azithromycin drug interaction) Daily INR for now   Nicole Cella, RPh Clinical Pharmacist Pager: [phone] 01/23/2015 4:31  PM

## 2015-01-23 NOTE — Progress Notes (Signed)
Hypoglycemic Event  CBG: 65  Treatment: 15 GM carbohydrate snack  Symptoms: None  Follow-up CBG: Time: 0513 CBG Result:109  Possible Reasons for Event: Inadequate meal intake  Comments/MD notified: no    Madeline [name]  Remember to initiate Hypoglycemia Order Set & complete

## 2015-01-23 NOTE — Progress Notes (Signed)
Utilization review completed. Bertha Stanfill, RN, BSN. 

## 2015-01-23 NOTE — Progress Notes (Signed)
[facility] PROGRESS NOTE   Madeline [name] [id number] DOB: Apr 05, 1926 DOA: 01/22/2015 PCP: Abigail Miyamoto, MD  HPI/Subjective: Seen with daughter at bedside, feels much better than yesterday. Still has some shortness of breath and cough.  Assessment/Plan: Principal Problem:   CAP (community acquired pneumonia) Active Problems:   Hypothyroidism   CKD (chronic kidney disease) stage 4, GFR 15-29 ml/min   DM2 (diabetes mellitus, type 2)   Atrial fibrillation   Anemia   Essential hypertension    Community acquired pneumonia Presented to the hospital with SOB, productive cough and fever/chills. Chest x-ray showed RML infiltrates consistent with pneumonia. Started on Rocephin and this Romycin. Get a sputum culture, urine streptococcal and Legionella antigen.  Type 2 diabetes mellitus Home medications continued including insulin, Amaryl and Tradjenta. Started on carbohydrate modified diet and insulin sliding scale.  CKD stage IV Creatinine appears to be at baseline at 1.5-1.9, monitor closely.  Hypertension  Blood pressure appears to be reasonable, continue home medications including Coreg, amlodipine and Lasix.  Atrial fibrillation Chronic atrial fibrillation, patient is on Coreg for rate control. Patient is on Coumadin for anticoagulation, pharmacy consulted for Coumadin dosage. This patients CHA2DS2-VASc Score and unadjusted Ischemic Stroke Rate (% per year) is equal to 7.2 % stroke rate/year from a score of 5 for CHF, HTN, DM and 2 points age above 59 Above score calculated as 1 point each if present [CHF, HTN, DM, Vascular=MI/PAD/Aortic Plaque, Age if 32-74, or Female] Above score calculated as 2 points each if present [Age > 75, or Stroke/TIA/TE]  Chronic diastolic CHF Appears to be compensated, continue with Coreg, Imdur and Lasix. Last 2-D echo in 2014 with LVEF of 55-60%, repeat 2-D echo.   Code Status: Full Code Family Communication: Plan discussed  with the patient. Disposition Plan: Remains inpatient Diet: Diet heart healthy/carb modified Room service appropriate?: Yes; Fluid consistency:: Thin  Consultants:  None  Procedures:  None  Antibiotics:  Rocephin and azithromycin.   Objective: Filed Vitals:   01/23/15 1042  BP: 135/44  Pulse:   Temp: 98.3 F (36.8 C)  Resp:     Intake/Output Summary (Last 24 hours) at 01/23/15 1157 Last data filed at 01/23/15 0900  Gross per 24 hour  Intake    240 ml  Output      0 ml  Net    240 ml   Filed Weights   01/22/15 1421 01/23/15 0443  Weight: 89.812 kg (198 lb) 91.8 kg (202 lb 6.1 oz)    Exam: General: Alert and awake, oriented x3, not in any acute distress. HEENT: anicteric sclera, pupils reactive to light and accommodation, EOMI CVS: S1-S2 clear, no murmur rubs or gallops Chest: clear to auscultation bilaterally, no wheezing, rales or rhonchi Abdomen: soft nontender, nondistended, normal bowel sounds, no organomegaly Extremities: no cyanosis, clubbing or edema noted bilaterally Neuro: Cranial nerves II-XII intact, no focal neurological deficits  Data Reviewed: Basic Metabolic Panel:  Recent Labs Lab 01/22/15 1458 01/23/15 0720  NA 136 135  K 4.0 3.1*  CL 97* 97*  CO2 29 26  GLUCOSE 149* 231*  BUN 29* 31*  CREATININE 1.82* 1.93*  CALCIUM 9.2 8.5*   Liver Function Tests:  Recent Labs Lab 01/22/15 1458 01/23/15 0720  AST 20 19  ALT 13* 11*  ALKPHOS 71 55  BILITOT 0.7 0.6  PROT 7.7 6.6  ALBUMIN 3.8 3.0*   No results for input(s): LIPASE, AMYLASE in the last 168 hours. No results for input(s): AMMONIA in the last 168  hours. CBC:  Recent Labs Lab 01/22/15 1458 01/23/15 0720  WBC 11.0* 11.2*  NEUTROABS 8.1*  --   HGB 11.4* 10.3*  HCT 35.3* 31.8*  MCV 97.2 96.1  PLT 202 197   Cardiac Enzymes:  Recent Labs Lab 01/22/15 2011 01/23/15 0140 01/23/15 0720  TROPONINI <0.03 0.05* 0.03   BNP (last 3 results) No results for input(s):  BNP in the last 8760 hours.  ProBNP (last 3 results) No results for input(s): PROBNP in the last 8760 hours.  CBG:  Recent Labs Lab 01/22/15 2027 01/23/15 0007 01/23/15 0449 01/23/15 0513 01/23/15 0805  GLUCAP 128* 138* 65 109* 195*    Micro Recent Results (from the past 240 hour(s))  Blood culture (routine x 2)     Status: None (Preliminary result)   Collection Time: 01/22/15  2:50 PM  Result Value Ref Range Status   Specimen Description BLOOD RIGHT AC  Final   Special Requests BOTTLES DRAWN AEROBIC AND ANAEROBIC 5CC EACH  Final   Culture   Final    NO GROWTH < 24 HOURS Performed at [facility]    Report Status PENDING  Incomplete  Blood culture (routine x 2)     Status: None (Preliminary result)   Collection Time: 01/22/15  2:58 PM  Result Value Ref Range Status   Specimen Description BLOOD LEFT AC  Final   Special Requests BOTTLES DRAWN AEROBIC AND ANAEROBIC 5CC EACH  Final   Culture   Final    NO GROWTH < 24 HOURS Performed at [facility]    Report Status PENDING  Incomplete  Culture, blood (routine x 2) Call MD if unable to obtain prior to antibiotics being given     Status: None (Preliminary result)   Collection Time: 01/22/15  8:05 PM  Result Value Ref Range Status   Specimen Description BLOOD RIGHT ANTECUBITAL  Final   Special Requests IN PEDIATRIC BOTTLE 2CC  Final   Culture NO GROWTH < 24 HOURS  Final   Report Status PENDING  Incomplete  Culture, blood (routine x 2) Call MD if unable to obtain prior to antibiotics being given     Status: None (Preliminary result)   Collection Time: 01/22/15  8:12 PM  Result Value Ref Range Status   Specimen Description BLOOD LEFT ANTECUBITAL  Final   Special Requests BOTTLES DRAWN AEROBIC AND ANAEROBIC 5CC  Final   Culture NO GROWTH < 24 HOURS  Final   Report Status PENDING  Incomplete     Studies: Dg Chest 2 View  01/22/2015   CLINICAL DATA:  Sick for 1 week fever yesterday productive cough  EXAM:  CHEST  2 VIEW  COMPARISON:  12/11/2012  FINDINGS: Stable mild cardiac enlargement. 4 cm area of infiltrate right middle lobe. No pleural effusion.  IMPRESSION: Right middle lobe infiltrate consistent with pneumonia. Followup PA and lateral chest X-ray is recommended in 3-4 weeks following trial of antibiotic therapy to ensure resolution and exclude underlying malignancy.   Electronically Signed   By: Skipper Cliche M.D.   On: 01/22/2015 15:15    Scheduled Meds: . amLODipine  10 mg Oral Daily  . azithromycin  500 mg Intravenous Q24H  . carvedilol  6.25 mg Oral BID WC  . cefTRIAXone (ROCEPHIN)  IV  1 g Intravenous Q24H  . cholecalciferol  1,000 Units Oral Daily  . docusate sodium  100 mg Oral BID  . ferrous sulfate  325 mg Oral TID WC  . furosemide  120 mg  Oral BID  . glimepiride  4 mg Oral BID  . insulin aspart  0-15 Units Subcutaneous 6 times per day  . insulin glargine  28 Units Subcutaneous QHS  . isosorbide mononitrate  30 mg Oral Daily  . levothyroxine  175 mcg Oral QAC breakfast  . linagliptin  5 mg Oral Daily  . mometasone   Topical Daily  . nystatin-triamcinolone  1 application Topical BID  . pantoprazole  40 mg Oral BID  . potassium chloride SA  40 mEq Oral BID  . pravastatin  40 mg Oral Daily  . senna  2 tablet Oral Daily  . silver sulfADIAZINE  1 application Topical Daily  . sodium chloride  3 mL Intravenous Q12H  . sodium chloride  3 mL Intravenous Q12H  . Warfarin - Pharmacist Dosing Inpatient   Does not apply q1800   Continuous Infusions:      Time spent: 35 minutes    [facility] A  [facility] Pager [phone] If 7PM-7AM, please contact night-coverage at [email or web address], password [facility] 01/23/2015, 11:57 AM  LOS: 1 day

## 2015-01-24 DIAGNOSIS — I481 Persistent atrial fibrillation: Secondary | ICD-10-CM

## 2015-01-24 LAB — BASIC METABOLIC PANEL
Anion gap: 9 (ref 5–15)
BUN: 42 mg/dL — AB (ref 6–20)
CHLORIDE: 100 mmol/L — AB (ref 101–111)
CO2: 28 mmol/L (ref 22–32)
CREATININE: 2.02 mg/dL — AB (ref 0.44–1.00)
Calcium: 8.8 mg/dL — ABNORMAL LOW (ref 8.9–10.3)
GFR calc Af Amer: 24 mL/min — ABNORMAL LOW (ref >60)
GFR calc non Af Amer: 21 mL/min — ABNORMAL LOW (ref >60)
Glucose, Bld: 89 mg/dL (ref 65–99)
Potassium: 3.8 mmol/L (ref 3.5–5.1)
SODIUM: 137 mmol/L (ref 135–145)

## 2015-01-24 LAB — GLUCOSE, CAPILLARY
GLUCOSE-CAPILLARY: 188 mg/dL — AB (ref 65–99)
GLUCOSE-CAPILLARY: 153 mg/dL — AB (ref 65–99)
GLUCOSE-CAPILLARY: 238 mg/dL — AB (ref 65–99)
Glucose-Capillary: 108 mg/dL — ABNORMAL HIGH (ref 65–99)
Glucose-Capillary: 163 mg/dL — ABNORMAL HIGH (ref 65–99)
Glucose-Capillary: 93 mg/dL (ref 65–99)

## 2015-01-24 LAB — PROTIME-INR
INR: 2.17 — ABNORMAL HIGH (ref 0.00–1.49)
PROTHROMBIN TIME: 24 s — AB (ref 11.6–15.2)

## 2015-01-24 MED ORDER — WARFARIN SODIUM 4 MG PO TABS
8.0000 mg | ORAL_TABLET | Freq: Once | ORAL | Status: AC
  Administered 2015-01-24: 8 mg via ORAL
  Filled 2015-01-24: qty 2

## 2015-01-24 NOTE — Discharge Instructions (Signed)
Information on my medicine - Coumadin   (Warfarin)  This medication education was reviewed with me or my healthcare representative as part of my discharge preparation.  The pharmacist that spoke with me during my hospital stay was:  Myrene Galas, [facility]  Why was Coumadin prescribed for you? Coumadin was prescribed for you because you have a blood clot or a medical condition that can cause an increased risk of forming blood clots. Blood clots can cause serious health problems by blocking the flow of blood to the heart, lung, or brain. Coumadin can prevent harmful blood clots from forming. As a reminder your indication for Coumadin is:   Stroke Prevention Because Of Atrial Fibrillation  What test will check on my response to Coumadin? While on Coumadin (warfarin) you will need to have an INR test regularly to ensure that your dose is keeping you in the desired range. The INR (international normalized ratio) number is calculated from the result of the laboratory test called prothrombin time (PT).  If an INR APPOINTMENT HAS NOT ALREADY BEEN MADE FOR YOU please schedule an appointment to have this lab work done by your health care provider within 7 days. Your INR goal is usually a number between:  2 to 3 or your provider may give you a more narrow range like 2-2.5.  Ask your health care provider during an office visit what your goal INR is.  What  do you need to  know  About  COUMADIN? Take Coumadin (warfarin) exactly as prescribed by your healthcare provider about the same time each day.  DO NOT stop taking without talking to the doctor who prescribed the medication.  Stopping without other blood clot prevention medication to take the place of Coumadin may increase your risk of developing a new clot or stroke.  Get refills before you run out.  What do you do if you miss a dose? If you miss a dose, take it as soon as you remember on the same day then continue your regularly scheduled regimen the next  day.  Do not take two doses of Coumadin at the same time.  Important Safety Information A possible side effect of Coumadin (Warfarin) is an increased risk of bleeding. You should call your healthcare provider right away if you experience any of the following: ? Bleeding from an injury or your nose that does not stop. ? Unusual colored urine (red or dark brown) or unusual colored stools (red or black). ? Unusual bruising for unknown reasons. ? A serious fall or if you hit your head (even if there is no bleeding).  Some foods or medicines interact with Coumadin (warfarin) and might alter your response to warfarin. To help avoid this: ? Eat a balanced diet, maintaining a consistent amount of Vitamin K. ? Notify your provider about major diet changes you plan to make. ? Avoid alcohol or limit your intake to 1 drink for women and 2 drinks for men per day. (1 drink is 5 oz. wine, 12 oz. beer, or 1.5 oz. liquor.)  Make sure that ANY health care provider who prescribes medication for you knows that you are taking Coumadin (warfarin).  Also make sure the healthcare provider who is monitoring your Coumadin knows when you have started a new medication including herbals and non-prescription products.  Coumadin (Warfarin)  Major Drug Interactions  Increased Warfarin Effect Decreased Warfarin Effect  Alcohol (large quantities) Antibiotics (esp. Septra/Bactrim, Flagyl, Cipro) Amiodarone (Cordarone) Aspirin (ASA) Cimetidine (Tagamet) Megestrol (Megace) NSAIDs (ibuprofen,  naproxen, etc.) °Piroxicam (Feldene) °Propafenone (Rythmol SR) °Propranolol (Inderal) °Isoniazid (INH) °Posaconazole (Noxafil) Barbiturates (Phenobarbital) °Carbamazepine (Tegretol) °Chlordiazepoxide (Librium) °Cholestyramine (Questran) °Griseofulvin °Oral Contraceptives °Rifampin °Sucralfate (Carafate) °Vitamin K  ° °Coumadin® (Warfarin) Major Herbal Interactions  °Increased Warfarin Effect Decreased Warfarin Effect   °Garlic °Ginseng °Ginkgo biloba Coenzyme Q10 °Green tea °St. John’s wort   ° °Coumadin® (Warfarin) FOOD Interactions  °Eat a consistent number of servings per week of foods HIGH in Vitamin K °(1 serving = ½ cup)  °Collards (cooked, or boiled & drained) °Kale (cooked, or boiled & drained) °Mustard greens (cooked, or boiled & drained) °Parsley *serving size only = ¼ cup °Spinach (cooked, or boiled & drained) °Swiss chard (cooked, or boiled & drained) °Turnip greens (cooked, or boiled & drained)  °Eat a consistent number of servings per week of foods MEDIUM-HIGH in Vitamin K °(1 serving = 1 cup)  °Asparagus (cooked, or boiled & drained) °Broccoli (cooked, boiled & drained, or raw & chopped) °Brussel sprouts (cooked, or boiled & drained) *serving size only = ½ cup °Lettuce, raw (green leaf, endive, romaine) °Spinach, raw °Turnip greens, raw & chopped  ° °These websites have more information on Coumadin (warfarin):  www.coumadin.com; °www.ahrq.gov/consumer/coumadin.htm; ° ° °

## 2015-01-24 NOTE — Progress Notes (Signed)
[facility] PROGRESS NOTE   Madeline [name] [id number] DOB: [birth year] DOA: 01/22/2015 PCP: Abigail Miyamoto, MD  HPI/Subjective: Seen with daughter at bedside, feels much better than yesterday. She was sitting at bedside getting her hair done by her daughter, feels much better than yesterday.  Assessment/Plan: Principal Problem:   CAP (community acquired pneumonia) Active Problems:   Hypothyroidism   CKD (chronic kidney disease) stage 4, GFR 15-29 ml/min   DM2 (diabetes mellitus, type 2)   Atrial fibrillation   Anemia   Essential hypertension    Community acquired pneumonia Presented to the hospital with SOB, productive cough and fever/chills. Chest x-ray showed RML infiltrates consistent with pneumonia. Get a sputum culture, urine streptococcal and Legionella antigen. Continue bronchial dilators, mucolytics, antitussives and oxygen as needed. On Rocephin and azithromycin.  Type 2 diabetes mellitus Home medications continued including insulin, Amaryl and Tradjenta. Started on carbohydrate modified diet and insulin sliding scale.  CKD stage IV Creatinine appears to be at baseline at 1.5-1.9, monitor closely.  Hypertension  Blood pressure appears to be reasonable, continue home medications including Coreg, amlodipine and Lasix.  Atrial fibrillation Chronic atrial fibrillation, patient is on Coreg for rate control. Patient is on Coumadin for anticoagulation, pharmacy consulted for Coumadin dosage. This patients CHA2DS2-VASc Score and unadjusted Ischemic Stroke Rate (% per year) is equal to 7.2 % stroke rate/year from a score of 5 for CHF, HTN, DM and 2 points age above 44 Above score calculated as 1 point each if present [CHF, HTN, DM, Vascular=MI/PAD/Aortic Plaque, Age if 49-74, or Female] Above score calculated as 2 points each if present [Age > 75, or Stroke/TIA/TE]  Chronic diastolic CHF Appears to be compensated, continue with Coreg, Imdur and Lasix. Last  2-D echo in 2014 with LVEF of 55-60%, repeated 2-D echo showed EF of 60%   Code Status: Full Code Family Communication: Plan discussed with the patient. Disposition Plan: Remains inpatient Diet: Diet heart healthy/carb modified Room service appropriate?: Yes; Fluid consistency:: Thin  Consultants:  None  Procedures:  None  Antibiotics:  Rocephin and azithromycin.   Objective: Filed Vitals:   01/24/15 1044  BP: 135/46  Pulse: 65  Temp:   Resp: 16    Intake/Output Summary (Last 24 hours) at 01/24/15 1233 Last data filed at 01/24/15 1041  Gross per 24 hour  Intake      3 ml  Output      0 ml  Net      3 ml   Filed Weights   01/22/15 1421 01/23/15 0443  Weight: 89.812 kg (198 lb) 91.8 kg (202 lb 6.1 oz)    Exam: General: Alert and awake, oriented x3, not in any acute distress. HEENT: anicteric sclera, pupils reactive to light and accommodation, EOMI CVS: S1-S2 clear, no murmur rubs or gallops Chest: clear to auscultation bilaterally, no wheezing, rales or rhonchi Abdomen: soft nontender, nondistended, normal bowel sounds, no organomegaly Extremities: no cyanosis, clubbing or edema noted bilaterally Neuro: Cranial nerves II-XII intact, no focal neurological deficits  Data Reviewed: Basic Metabolic Panel:  Recent Labs Lab 01/22/15 1458 01/23/15 0720 01/24/15 0643  NA 136 135 137  K 4.0 3.1* 3.8  CL 97* 97* 100*  CO2 29 26 28   GLUCOSE 149* 231* 89  BUN 29* 31* 42*  CREATININE 1.82* 1.93* 2.02*  CALCIUM 9.2 8.5* 8.8*   Liver Function Tests:  Recent Labs Lab 01/22/15 1458 01/23/15 0720  AST 20 19  ALT 13* 11*  ALKPHOS 71 55  BILITOT 0.7  0.6  PROT 7.7 6.6  ALBUMIN 3.8 3.0*   No results for input(s): LIPASE, AMYLASE in the last 168 hours. No results for input(s): AMMONIA in the last 168 hours. CBC:  Recent Labs Lab 01/22/15 1458 01/23/15 0720  WBC 11.0* 11.2*  NEUTROABS 8.1*  --   HGB 11.4* 10.3*  HCT 35.3* 31.8*  MCV 97.2 96.1  PLT  202 197   Cardiac Enzymes:  Recent Labs Lab 01/22/15 2011 01/23/15 0140 01/23/15 0720  TROPONINI <0.03 0.05* 0.03   BNP (last 3 results) No results for input(s): BNP in the last 8760 hours.  ProBNP (last 3 results) No results for input(s): PROBNP in the last 8760 hours.  CBG:  Recent Labs Lab 01/23/15 2127 01/24/15 0015 01/24/15 0518 01/24/15 0755 01/24/15 1155  GLUCAP 173* 163* 93 108* 188*    Micro Recent Results (from the past 240 hour(s))  Blood culture (routine x 2)     Status: None (Preliminary result)   Collection Time: 01/22/15  2:50 PM  Result Value Ref Range Status   Specimen Description BLOOD RIGHT AC  Final   Special Requests BOTTLES DRAWN AEROBIC AND ANAEROBIC 5CC EACH  Final   Culture   Final    NO GROWTH < 24 HOURS Performed at [facility]    Report Status PENDING  Incomplete  Blood culture (routine x 2)     Status: None (Preliminary result)   Collection Time: 01/22/15  2:58 PM  Result Value Ref Range Status   Specimen Description BLOOD LEFT AC  Final   Special Requests BOTTLES DRAWN AEROBIC AND ANAEROBIC 5CC EACH  Final   Culture   Final    NO GROWTH < 24 HOURS Performed at [facility]    Report Status PENDING  Incomplete  Culture, blood (routine x 2) Call MD if unable to obtain prior to antibiotics being given     Status: None (Preliminary result)   Collection Time: 01/22/15  8:05 PM  Result Value Ref Range Status   Specimen Description BLOOD RIGHT ANTECUBITAL  Final   Special Requests IN PEDIATRIC BOTTLE 2CC  Final   Culture NO GROWTH < 24 HOURS  Final   Report Status PENDING  Incomplete  Culture, blood (routine x 2) Call MD if unable to obtain prior to antibiotics being given     Status: None (Preliminary result)   Collection Time: 01/22/15  8:12 PM  Result Value Ref Range Status   Specimen Description BLOOD LEFT ANTECUBITAL  Final   Special Requests BOTTLES DRAWN AEROBIC AND ANAEROBIC 5CC  Final   Culture NO GROWTH <  24 HOURS  Final   Report Status PENDING  Incomplete     Studies: Dg Chest 2 View  01/22/2015   CLINICAL DATA:  Sick for 1 week fever yesterday productive cough  EXAM: CHEST  2 VIEW  COMPARISON:  12/11/2012  FINDINGS: Stable mild cardiac enlargement. 4 cm area of infiltrate right middle lobe. No pleural effusion.  IMPRESSION: Right middle lobe infiltrate consistent with pneumonia. Followup PA and lateral chest X-ray is recommended in 3-4 weeks following trial of antibiotic therapy to ensure resolution and exclude underlying malignancy.   Electronically Signed   By: Skipper Cliche M.D.   On: 01/22/2015 15:15    Scheduled Meds: . amLODipine  10 mg Oral Daily  . azithromycin  500 mg Intravenous Q24H  . carvedilol  6.25 mg Oral BID WC  . cefTRIAXone (ROCEPHIN)  IV  1 g Intravenous Q24H  .  cholecalciferol  1,000 Units Oral Daily  . docusate sodium  100 mg Oral BID  . ferrous sulfate  325 mg Oral TID WC  . furosemide  120 mg Oral BID  . glimepiride  4 mg Oral BID  . insulin aspart  0-15 Units Subcutaneous 6 times per day  . insulin glargine  28 Units Subcutaneous QHS  . isosorbide mononitrate  30 mg Oral Daily  . levothyroxine  175 mcg Oral QAC breakfast  . linagliptin  5 mg Oral Daily  . mometasone   Topical Daily  . nystatin-triamcinolone  1 application Topical BID  . pantoprazole  40 mg Oral BID  . potassium chloride SA  40 mEq Oral BID  . pravastatin  40 mg Oral Daily  . senna  2 tablet Oral Daily  . silver sulfADIAZINE  1 application Topical Daily  . sodium chloride  3 mL Intravenous Q12H  . sodium chloride  3 mL Intravenous Q12H  . warfarin  8 mg Oral ONCE-1800  . Warfarin - Pharmacist Dosing Inpatient   Does not apply q1800   Continuous Infusions:      Time spent: 35 minutes    [facility] A  [facility] Pager [phone] If 7PM-7AM, please contact night-coverage at [email or web address], password [facility] 01/24/2015, 12:33 PM  LOS: 2 days

## 2015-01-24 NOTE — Progress Notes (Signed)
ANTICOAGULATION CONSULT NOTE - Follow Up Consult  Pharmacy Consult for warfarin Indication: atrial fibrillation  No Known Allergies  Patient Measurements: Height: 5\' 1"  (154.9 cm) Weight: 202 lb 6.1 oz (91.8 kg) IBW/kg (Calculated) : 47.8  Vital Signs: Temp: 99 F (37.2 C) (09/17 0524) Temp Source: Oral (09/17 0524) BP: 115/52 mmHg (09/17 0524) Pulse Rate: 56 (09/17 0524)  Labs:  Recent Labs  01/22/15 1458 01/22/15 2011 01/23/15 0140 01/23/15 0720 01/24/15 0643  HGB 11.4*  --   --  10.3*  --   HCT 35.3*  --   --  31.8*  --   PLT 202  --   --  197  --   APTT  --   --   --  55*  --   LABPROT 23.9*  --  24.8*  --  24.0*  INR 2.16*  --  2.27*  --  2.17*  CREATININE 1.82*  --   --  1.93* 2.02*  TROPONINI  --  <0.03 0.05* 0.03  --     Estimated Creatinine Clearance: 20.3 mL/min (by C-G formula based on Cr of 2.02).  Assessment:  81 yo F who presented to her PCP 9/14 with generalized weakness and fatigue. Pt is on Coumadin PTA for hx of afib and pharmacy was been consulted to resume. INR on admission was therapeutic. INR is 2.27>2.17, remains therapeutic.  Hgb 10.3, pltc 197K No bleeding noted. Azithromycin may increase coumadin effect; will monitor closely. .   Goal of Therapy:  INR 2-3 Monitor platelets by anticoagulation protocol: Yes   Plan:  Warfarin 8 mg po x 1 tonight (typical Sat dose 9 mg, will decrease due to potential azithromycin interaction) Daily INR; monitor for s&sx of bleed  Angela Burke, PharmD Pharmacy Resident Pager: [phone] 01/24/2015,8:47 AM

## 2015-01-25 LAB — GLUCOSE, CAPILLARY
GLUCOSE-CAPILLARY: 88 mg/dL (ref 65–99)
GLUCOSE-CAPILLARY: 161 mg/dL — AB (ref 65–99)
GLUCOSE-CAPILLARY: 194 mg/dL — AB (ref 65–99)
Glucose-Capillary: 130 mg/dL — ABNORMAL HIGH (ref 65–99)
Glucose-Capillary: 146 mg/dL — ABNORMAL HIGH (ref 65–99)
Glucose-Capillary: 162 mg/dL — ABNORMAL HIGH (ref 65–99)
Glucose-Capillary: 61 mg/dL — ABNORMAL LOW (ref 65–99)

## 2015-01-25 LAB — BASIC METABOLIC PANEL
Anion gap: 11 (ref 5–15)
BUN: 42 mg/dL — ABNORMAL HIGH (ref 6–20)
CALCIUM: 9.1 mg/dL (ref 8.9–10.3)
CO2: 27 mmol/L (ref 22–32)
CREATININE: 2.02 mg/dL — AB (ref 0.44–1.00)
Chloride: 101 mmol/L (ref 101–111)
GFR calc Af Amer: 24 mL/min — ABNORMAL LOW (ref >60)
GFR calc non Af Amer: 21 mL/min — ABNORMAL LOW (ref >60)
GLUCOSE: 55 mg/dL — AB (ref 65–99)
Potassium: 3.3 mmol/L — ABNORMAL LOW (ref 3.5–5.1)
Sodium: 139 mmol/L (ref 135–145)

## 2015-01-25 LAB — PROTIME-INR
INR: 2.42 — ABNORMAL HIGH (ref 0.00–1.49)
PROTHROMBIN TIME: 26.1 s — AB (ref 11.6–15.2)

## 2015-01-25 MED ORDER — WARFARIN SODIUM 5 MG PO TABS
5.0000 mg | ORAL_TABLET | Freq: Once | ORAL | Status: AC
  Administered 2015-01-25: 5 mg via ORAL
  Filled 2015-01-25: qty 1

## 2015-01-25 NOTE — Progress Notes (Signed)
[facility] PROGRESS NOTE   Madeline [name] [id number] DOB: [birth year] DOA: 01/22/2015 PCP: Abigail Miyamoto, MD  HPI/Subjective: Seen with daughter at bedside. Had an episode of hypoglycemia of 55 this morning, hold on a sliding scale.  Assessment/Plan: Principal Problem:   CAP (community acquired pneumonia) Active Problems:   Hypothyroidism   CKD (chronic kidney disease) stage 4, GFR 15-29 ml/min   DM2 (diabetes mellitus, type 2)   Atrial fibrillation   Anemia   Essential hypertension    Community acquired pneumonia Presented to the hospital with SOB, productive cough and fever/chills. Chest x-ray showed RML infiltrates consistent with pneumonia. Get a sputum culture, urine streptococcal and Legionella antigen. Continue bronchial dilators, mucolytics, antitussives and oxygen as needed. On Rocephin and azithromycin, ambulate today likely to be discharged in a.m.  Type 2 diabetes mellitus Home medications continued including insulin, Amaryl and Tradjenta. Started on carbohydrate modified diet and insulin sliding scale.  CKD stage IV Creatinine appears to be at baseline at 1.5-1.9, monitor closely.  Hypertension  Blood pressure appears to be reasonable, continue home medications including Coreg, amlodipine and Lasix.  Atrial fibrillation Chronic atrial fibrillation, patient is on Coreg for rate control. Patient is on Coumadin for anticoagulation, pharmacy consulted for Coumadin dosage. This patients CHA2DS2-VASc Score and unadjusted Ischemic Stroke Rate (% per year) is equal to 7.2 % stroke rate/year from a score of 5 for CHF, HTN, DM and 2 points age above 47 Above score calculated as 1 point each if present [CHF, HTN, DM, Vascular=MI/PAD/Aortic Plaque, Age if 24-74, or Female] Above score calculated as 2 points each if present [Age > 75, or Stroke/TIA/TE]  Chronic diastolic CHF Appears to be compensated, continue with Coreg, Imdur and Lasix. Last 2-D echo in  2014 with LVEF of 55-60%, repeated 2-D echo showed EF of 60% Denies any shortness of breath.  Code Status: Full Code Family Communication: Plan discussed with the patient. Disposition Plan: Remains inpatient Diet: Diet heart healthy/carb modified Room service appropriate?: Yes; Fluid consistency:: Thin  Consultants:  None  Procedures:  None  Antibiotics:  Rocephin and azithromycin.   Objective: Filed Vitals:   01/25/15 0530  BP: 129/46  Pulse: 59  Temp: 97.8 F (36.6 C)  Resp: 18   No intake or output data in the 24 hours ending 01/25/15 1308 Filed Weights   01/22/15 1421 01/23/15 0443 01/25/15 0530  Weight: 89.812 kg (198 lb) 91.8 kg (202 lb 6.1 oz) 92.9 kg (204 lb 12.9 oz)    Exam: General: Alert and awake, oriented x3, not in any acute distress. HEENT: anicteric sclera, pupils reactive to light and accommodation, EOMI CVS: S1-S2 clear, no murmur rubs or gallops Chest: clear to auscultation bilaterally, no wheezing, rales or rhonchi Abdomen: soft nontender, nondistended, normal bowel sounds, no organomegaly Extremities: no cyanosis, clubbing or edema noted bilaterally Neuro: Cranial nerves II-XII intact, no focal neurological deficits  Data Reviewed: Basic Metabolic Panel:  Recent Labs Lab 01/22/15 1458 01/23/15 0720 01/24/15 0643 01/25/15 0422  NA 136 135 137 139  K 4.0 3.1* 3.8 3.3*  CL 97* 97* 100* 101  CO2 29 26 28 27   GLUCOSE 149* 231* 89 55*  BUN 29* 31* 42* 42*  CREATININE 1.82* 1.93* 2.02* 2.02*  CALCIUM 9.2 8.5* 8.8* 9.1   Liver Function Tests:  Recent Labs Lab 01/22/15 1458 01/23/15 0720  AST 20 19  ALT 13* 11*  ALKPHOS 71 55  BILITOT 0.7 0.6  PROT 7.7 6.6  ALBUMIN 3.8 3.0*   No  results for input(s): LIPASE, AMYLASE in the last 168 hours. No results for input(s): AMMONIA in the last 168 hours. CBC:  Recent Labs Lab 01/22/15 1458 01/23/15 0720  WBC 11.0* 11.2*  NEUTROABS 8.1*  --   HGB 11.4* 10.3*  HCT 35.3* 31.8*  MCV  97.2 96.1  PLT 202 197   Cardiac Enzymes:  Recent Labs Lab 01/22/15 2011 01/23/15 0140 01/23/15 0720  TROPONINI <0.03 0.05* 0.03   BNP (last 3 results) No results for input(s): BNP in the last 8760 hours.  ProBNP (last 3 results) No results for input(s): PROBNP in the last 8760 hours.  CBG:  Recent Labs Lab 01/25/15 0003 01/25/15 0534 01/25/15 0602 01/25/15 0807 01/25/15 1151  GLUCAP 130* 61* 88 146* 161*    Micro Recent Results (from the past 240 hour(s))  Blood culture (routine x 2)     Status: None (Preliminary result)   Collection Time: 01/22/15  2:50 PM  Result Value Ref Range Status   Specimen Description BLOOD RIGHT AC  Final   Special Requests BOTTLES DRAWN AEROBIC AND ANAEROBIC 5CC EACH  Final   Culture   Final    NO GROWTH 2 DAYS Performed at [facility]    Report Status PENDING  Incomplete  Blood culture (routine x 2)     Status: None (Preliminary result)   Collection Time: 01/22/15  2:58 PM  Result Value Ref Range Status   Specimen Description BLOOD LEFT AC  Final   Special Requests BOTTLES DRAWN AEROBIC AND ANAEROBIC 5CC EACH  Final   Culture   Final    NO GROWTH 2 DAYS Performed at [facility]    Report Status PENDING  Incomplete  Culture, blood (routine x 2) Call MD if unable to obtain prior to antibiotics being given     Status: None (Preliminary result)   Collection Time: 01/22/15  8:05 PM  Result Value Ref Range Status   Specimen Description BLOOD RIGHT ANTECUBITAL  Final   Special Requests IN PEDIATRIC BOTTLE 2CC  Final   Culture NO GROWTH 2 DAYS  Final   Report Status PENDING  Incomplete  Culture, blood (routine x 2) Call MD if unable to obtain prior to antibiotics being given     Status: None (Preliminary result)   Collection Time: 01/22/15  8:12 PM  Result Value Ref Range Status   Specimen Description BLOOD LEFT ANTECUBITAL  Final   Special Requests BOTTLES DRAWN AEROBIC AND ANAEROBIC 5CC  Final   Culture NO GROWTH  2 DAYS  Final   Report Status PENDING  Incomplete     Studies: No results found.  Scheduled Meds: . amLODipine  10 mg Oral Daily  . azithromycin  500 mg Intravenous Q24H  . carvedilol  6.25 mg Oral BID WC  . cefTRIAXone (ROCEPHIN)  IV  1 g Intravenous Q24H  . cholecalciferol  1,000 Units Oral Daily  . docusate sodium  100 mg Oral BID  . ferrous sulfate  325 mg Oral TID WC  . furosemide  120 mg Oral BID  . glimepiride  4 mg Oral BID  . insulin aspart  0-15 Units Subcutaneous 6 times per day  . insulin glargine  28 Units Subcutaneous QHS  . isosorbide mononitrate  30 mg Oral Daily  . levothyroxine  175 mcg Oral QAC breakfast  . linagliptin  5 mg Oral Daily  . mometasone   Topical Daily  . nystatin-triamcinolone  1 application Topical BID  . pantoprazole  40 mg  Oral BID  . potassium chloride SA  40 mEq Oral BID  . pravastatin  40 mg Oral Daily  . senna  2 tablet Oral Daily  . silver sulfADIAZINE  1 application Topical Daily  . sodium chloride  3 mL Intravenous Q12H  . sodium chloride  3 mL Intravenous Q12H  . warfarin  5 mg Oral ONCE-1800  . Warfarin - Pharmacist Dosing Inpatient   Does not apply q1800   Continuous Infusions:      Time spent: 35 minutes    [facility]  [facility] Pager [phone] If 7PM-7AM, please contact night-coverage at [email or web address], password [facility] 01/25/2015, 1:08 PM  LOS: 3 days

## 2015-01-25 NOTE — Progress Notes (Signed)
ANTICOAGULATION CONSULT NOTE - Follow Up Consult  Pharmacy Consult for warfarin Indication: atrial fibrillation  No Known Allergies  Patient Measurements: Height: 5\' 1"  (154.9 cm) Weight: 204 lb 12.9 oz (92.9 kg) IBW/kg (Calculated) : 47.8  Vital Signs: Temp: 97.8 F (36.6 C) (09/18 0530) Temp Source: Oral (09/18 0530) BP: 129/46 mmHg (09/18 0530) Pulse Rate: 59 (09/18 0530)  Labs:  Recent Labs  01/22/15 1458 01/22/15 2011 01/23/15 0140 01/23/15 0720 01/24/15 0643 01/25/15 0422  HGB 11.4*  --   --  10.3*  --   --   HCT 35.3*  --   --  31.8*  --   --   PLT 202  --   --  197  --   --   APTT  --   --   --  55*  --   --   LABPROT 23.9*  --  24.8*  --  24.0* 26.1*  INR 2.16*  --  2.27*  --  2.17* 2.42*  CREATININE 1.82*  --   --  1.93* 2.02* 2.02*  TROPONINI  --  <0.03 0.05* 0.03  --   --     Estimated Creatinine Clearance: 20.4 mL/min (by C-G formula based on Cr of 2.02).  Assessment:  81 yo F who presented to her PCP 9/14 with generalized weakness and fatigue. Pt is on Coumadin PTA for hx of afib and pharmacy was been consulted to resume. INR on admission was therapeutic. INR is 2.27>2.17>2.42, remains therapeutic.  Hgb 10.3, pltc 197K No bleeding noted. Azithromycin may increase coumadin effect; will monitor closely.  Goal of Therapy:  INR 2-3 Monitor platelets by anticoagulation protocol: Yes   Plan:  Warfarin 5 mg po x 1 tonight (Home dose would be 6 mg today but continuing 5 mg due to azithro interaction) Daily INR; monitor for s&sx of bleed  Angela Burke, PharmD Pharmacy Resident Pager: [phone] 01/25/2015,7:09 AM

## 2015-01-26 LAB — LEGIONELLA ANTIGEN, URINE

## 2015-01-26 LAB — BASIC METABOLIC PANEL
Anion gap: 10 (ref 5–15)
BUN: 44 mg/dL — ABNORMAL HIGH (ref 6–20)
CALCIUM: 8.8 mg/dL — AB (ref 8.9–10.3)
CO2: 28 mmol/L (ref 22–32)
CREATININE: 1.83 mg/dL — AB (ref 0.44–1.00)
Chloride: 97 mmol/L — ABNORMAL LOW (ref 101–111)
GFR calc non Af Amer: 24 mL/min — ABNORMAL LOW (ref >60)
GFR, EST AFRICAN AMERICAN: 27 mL/min — AB (ref >60)
Glucose, Bld: 86 mg/dL (ref 65–99)
Potassium: 3.8 mmol/L (ref 3.5–5.1)
SODIUM: 135 mmol/L (ref 135–145)

## 2015-01-26 LAB — PROTIME-INR
INR: 2.74 — AB (ref 0.00–1.49)
PROTHROMBIN TIME: 28.6 s — AB (ref 11.6–15.2)

## 2015-01-26 LAB — GLUCOSE, CAPILLARY: GLUCOSE-CAPILLARY: 80 mg/dL (ref 65–99)

## 2015-01-26 MED ORDER — AZITHROMYCIN 250 MG PO TABS: ORAL_TABLET | ORAL | Status: DC

## 2015-01-26 MED ORDER — CEFUROXIME AXETIL 500 MG PO TABS: 500.0000 mg | ORAL_TABLET | Freq: Two times a day (BID) | ORAL | Status: DC

## 2015-01-26 NOTE — Discharge Summary (Signed)
Physician Discharge Summary  Madeline [name] [id number] DOB: [birth year] DOA: 01/22/2015  PCP: Abigail Miyamoto, MD  Admit date: 01/22/2015 Discharge date: 01/26/2015  Time spent: 40 minutes  Recommendations for Outpatient Follow-up:  1. Follow-up with primary care physician within one week. 2. Continue Ceftin and azithromycin for 5 more days.  Discharge Diagnoses:  Principal Problem:   CAP (community acquired pneumonia) Active Problems:   Hypothyroidism   CKD (chronic kidney disease) stage 4, GFR 15-29 ml/min   DM2 (diabetes mellitus, type 2)   Atrial fibrillation   Anemia   Essential hypertension   Discharge Condition: Stable  Diet recommendation: Heart healthy  Filed Weights   01/23/15 0443 01/25/15 0530 01/26/15 [id number]  Weight: 91.8 kg (202 lb 6.1 oz) 92.9 kg (204 lb 12.9 oz) 93.9 kg (207 lb 0.2 oz)    History of present illness:  Madeline [name] is a 81 y.o. female with prior history of Atrial Fibrillation CHADS VASC score 7 HTN DM CHF CKD IV presented to [facility] with complaints of shortness of breath. She had been having increased WOB for about 3-4 days ago. She had a fever yesterday. Overall has been feeling tired and fatigued for the last week. She went to her PCP and was given antibiotic for a UTI. Patient was started on keflex at that time. She states that she did not feel any better and her daughters took her to the ED. She has been having some cough and congestion. She states that she has had some sputum which was yellow. She had no blood in the sputum. She had no chest pain. She had no nausea vomiting or diarrhea. She is a diabetic and does take insulin. Family reports well controlled. She does not smoke. She has not travelled anywhere and she lives at home with her daughter.  Hospital Course:   Community acquired pneumonia Presented to the hospital with SOB, productive cough and fever/chills. Chest x-ray showed RML infiltrates consistent with pneumonia. Blood culture and  sputum culture obtained on the time of admission, NGTD. Treated with bronchial dilators, mucolytics, antitussives and oxygen as needed. Was on Rocephin and azithromycin, discharge on 5 more days of Ceftin and azithromycin.  Type 2 diabetes mellitus Home medications continued including insulin, Amaryl and Tradjenta. Started on carbohydrate modified diet and insulin sliding scale.  CKD stage IV Creatinine appears to be at baseline at 1.5-1.9, monitor closely.  Hypertension  Blood pressure appears to be reasonable, continue home medications including Coreg, amlodipine and Lasix.  Atrial fibrillation Chronic atrial fibrillation, patient is on Coreg for rate control. Patient is on Coumadin for anticoagulation. INR is 2.74 on discharge, Continue home dose. This patients CHA2DS2-VASc Score and unadjusted Ischemic Stroke Rate (% per year) is equal to 7.2 % stroke rate/year from a score of 5 for CHF, HTN, DM and 2 points age above 20 Above score calculated as 1 point each if present [CHF, HTN, DM, Vascular=MI/PAD/Aortic Plaque, Age if 17-74, or Female] Above score calculated as 2 points each if present [Age > 75, or Stroke/TIA/TE]  Chronic diastolic CHF Appears to be compensated, continue with Coreg, Imdur and Lasix. Last 2-D echo in 2014 with LVEF of 55-60%, repeated 2-D echo showed EF of 60% Denies any shortness of breath.   Procedures:  None  Consultations:  None  Discharge Exam: Filed Vitals:   01/26/15 1003  BP:   Pulse:   Temp: 98 F (36.7 C)  Resp: 17   General: Alert and awake, oriented x3, not in any acute  distress. HEENT: anicteric sclera, pupils reactive to light and accommodation, EOMI CVS: S1-S2 clear, no murmur rubs or gallops Chest: clear to auscultation bilaterally, no wheezing, rales or rhonchi Abdomen: soft nontender, nondistended, normal bowel sounds, no organomegaly Extremities: no cyanosis, clubbing or edema noted bilaterally Neuro: Cranial nerves II-XII  intact, no focal neurological deficits  Discharge Instructions   Discharge Instructions    Diet - low sodium heart healthy    Complete by:  As directed      Increase activity slowly    Complete by:  As directed           Current Discharge Medication List    START taking these medications   Details  azithromycin (ZITHROMAX Z-PAK) 250 MG tablet Take 2 tablets in the first day then 1 tablet PO daily till it is gone Qty: 6 each, Refills: 0    cefUROXime (CEFTIN) 500 MG tablet Take 1 tablet (500 mg total) by mouth 2 (two) times daily with a meal. Qty: 10 tablet, Refills: 0      CONTINUE these medications which have NOT CHANGED   Details  amLODipine (NORVASC) 10 MG tablet Take 10 mg by mouth daily.    carvedilol (COREG) 6.25 MG tablet Take 6.25 mg by mouth 2 (two) times daily with a meal.    cholecalciferol (VITAMIN D) 1000 UNITS tablet Take 1,000 Units by mouth daily.    docusate sodium 100 MG CAPS Take 100 mg by mouth 2 (two) times daily. OTC Qty: 10 capsule, Refills: 0    ferrous sulfate 325 (65 FE) MG tablet Take 1 tablet (325 mg total) by mouth 3 (three) times daily with meals.    furosemide (LASIX) 40 MG tablet Take 3 tablets (120 mg total) by mouth 2 (two) times daily. Qty: 180 tablet, Refills: 0    glimepiride (AMARYL) 4 MG tablet Take 4 mg by mouth 2 (two) times daily.    HYDROcodone-acetaminophen (NORCO/VICODIN) 5-325 MG per tablet Take 1 tablet by mouth every 6 (six) hours as needed for moderate pain. Qty: 10 tablet, Refills: 0    isosorbide mononitrate (IMDUR) 30 MG 24 hr tablet Take 30 mg by mouth daily.    levothyroxine (SYNTHROID, LEVOTHROID) 175 MCG tablet Take 200 mcg by mouth daily. Takes 2 tablets on Sunday. Refills: 5    metolazone (ZAROXOLYN) 2.5 MG tablet Take 2.5 mg once a week if needed Qty: 30 tablet, Refills: 6    mometasone (ELOCON) 0.1 % cream Apply 1 application topically daily.  Refills: 0    nystatin-triamcinolone (MYCOLOG II) cream  Apply 1 application topically 2 (two) times daily.    pantoprazole (PROTONIX) 40 MG tablet Take 40 mg by mouth 2 (two) times daily.    potassium chloride SA (K-DUR,KLOR-CON) 20 MEQ tablet Take 2 tablets (40 mEq total) by mouth 2 (two) times daily. Qty: 120 tablet, Refills: 0    pravastatin (PRAVACHOL) 40 MG tablet Take 40 mg by mouth at bedtime.     senna (SENOKOT) 8.6 MG TABS Take 2 tablets (17.2 mg total) by mouth daily. otc Qty: 120 each, Refills: 0    silver sulfADIAZINE (SILVADENE) 1 % cream Apply 1 application topically daily.    sitaGLIPtin (JANUVIA) 50 MG tablet Take 25 mg by mouth daily. Take half a tablet daily.    TOUJEO SOLOSTAR 300 UNIT/ML SOPN 31 Units at bedtime.     traMADol (ULTRAM) 50 MG tablet Take 1 tablet (50 mg total) by mouth every 6 (six) hours as needed for pain.  Qty: 30 tablet, Refills: 0    warfarin (COUMADIN) 3 MG tablet TAKE AS DIRECTED Qty: 80 tablet, Refills: 3    nitroGLYCERIN (NITROSTAT) 0.4 MG SL tablet Place 0.4 mg under the tongue every 5 (five) minutes as needed for chest pain.    polyethylene glycol (MIRALAX / GLYCOLAX) packet Take 17 g by mouth daily as needed. otc Qty: 14 each, Refills: 0      STOP taking these medications     cephALEXin (KEFLEX) 500 MG capsule        No Known Allergies Follow-up Information    Follow up with FRIED, ROBERT L, MD In 1 week.   Specialty:  Family Medicine   Contact information:   [address] [address] [phone]        The results of significant diagnostics from this hospitalization (including imaging, microbiology, ancillary and laboratory) are listed below for reference.    Significant Diagnostic Studies: Dg Chest 2 View  01/22/2015   CLINICAL DATA:  Sick for 1 week fever yesterday productive cough  EXAM: CHEST  2 VIEW  COMPARISON:  12/11/2012  FINDINGS: Stable mild cardiac enlargement. 4 cm area of infiltrate right middle lobe. No pleural effusion.  IMPRESSION: Right  middle lobe infiltrate consistent with pneumonia. Followup PA and lateral chest X-ray is recommended in 3-4 weeks following trial of antibiotic therapy to ensure resolution and exclude underlying malignancy.   Electronically Signed   By: Skipper Cliche M.D.   On: 01/22/2015 15:15    Microbiology: Recent Results (from the past 240 hour(s))  Blood culture (routine x 2)     Status: None (Preliminary result)   Collection Time: 01/22/15  2:50 PM  Result Value Ref Range Status   Specimen Description BLOOD RIGHT [facility]  Final   Special Requests BOTTLES DRAWN AEROBIC AND ANAEROBIC 5CC EACH  Final   Culture   Final    NO GROWTH 3 DAYS Performed at [facility]    Report Status PENDING  Incomplete  Blood culture (routine x 2)     Status: None (Preliminary result)   Collection Time: 01/22/15  2:58 PM  Result Value Ref Range Status   Specimen Description BLOOD LEFT AC  Final   Special Requests BOTTLES DRAWN AEROBIC AND ANAEROBIC 5CC EACH  Final   Culture   Final    NO GROWTH 3 DAYS Performed at [facility]    Report Status PENDING  Incomplete  Culture, blood (routine x 2) Call MD if unable to obtain prior to antibiotics being given     Status: None (Preliminary result)   Collection Time: 01/22/15  8:05 PM  Result Value Ref Range Status   Specimen Description BLOOD RIGHT ANTECUBITAL  Final   Special Requests IN PEDIATRIC BOTTLE 2CC  Final   Culture NO GROWTH 3 DAYS  Final   Report Status PENDING  Incomplete  Culture, blood (routine x 2) Call MD if unable to obtain prior to antibiotics being given     Status: None (Preliminary result)   Collection Time: 01/22/15  8:12 PM  Result Value Ref Range Status   Specimen Description BLOOD LEFT ANTECUBITAL  Final   Special Requests BOTTLES DRAWN AEROBIC AND ANAEROBIC 5CC  Final   Culture NO GROWTH 3 DAYS  Final   Report Status PENDING  Incomplete     Labs: Basic Metabolic Panel:  Recent Labs Lab 01/22/15 1458 01/23/15 0720  01/24/15 0643 01/25/15 0422 01/26/15 0453  NA 136 135  137 139 135  K 4.0 3.1* 3.8 3.3* 3.8  CL 97* 97* 100* 101 97*  CO2 29 26 28 27 28   GLUCOSE 149* 231* 89 55* 86  BUN 29* 31* 42* 42* 44*  CREATININE 1.82* 1.93* 2.02* 2.02* 1.83*  CALCIUM 9.2 8.5* 8.8* 9.1 8.8*   Liver Function Tests:  Recent Labs Lab 01/22/15 1458 01/23/15 0720  AST 20 19  ALT 13* 11*  ALKPHOS 71 55  BILITOT 0.7 0.6  PROT 7.7 6.6  ALBUMIN 3.8 3.0*   No results for input(s): LIPASE, AMYLASE in the last 168 hours. No results for input(s): AMMONIA in the last 168 hours. CBC:  Recent Labs Lab 01/22/15 1458 01/23/15 0720  WBC 11.0* 11.2*  NEUTROABS 8.1*  --   HGB 11.4* 10.3*  HCT 35.3* 31.8*  MCV 97.2 96.1  PLT 202 197   Cardiac Enzymes:  Recent Labs Lab 01/22/15 2011 01/23/15 0140 01/23/15 0720  TROPONINI <0.03 0.05* 0.03   BNP: BNP (last 3 results) No results for input(s): BNP in the last 8760 hours.  ProBNP (last 3 results) No results for input(s): PROBNP in the last 8760 hours.  CBG:  Recent Labs Lab 01/25/15 0807 01/25/15 1151 01/25/15 1600 01/25/15 2200 01/26/15 0631  GLUCAP 146* 161* 162* 194* 80       Signed:  ELMAHI,MUTAZ A  [facility] 01/26/2015, 10:54 AM

## 2015-01-26 NOTE — Progress Notes (Signed)
Pt being discharged home via wheelchair with family. Pt alert and oriented x4. VSS. Pt c/o no pain at this time. No signs of respiratory distress. Education complete and care plans resolved. IV removed with catheter intact and pt tolerated well. No further issues at this time. Pt to follow up with PCP. Stone,Heather R, RN 

## 2015-01-26 NOTE — Care Management Important Message (Signed)
Important Message  Patient Details  Name: Madeline [name] MRN: [id number] Date of Birth: 06/04/1925   Medicare Important Message Given:  Yes-second notification given    Delorse Lek 01/26/2015, 9:59 AM

## 2015-01-27 ENCOUNTER — Encounter (HOSPITAL_COMMUNITY): Payer: Medicare Other

## 2015-01-27 LAB — CULTURE, BLOOD (ROUTINE X 2)
CULTURE: NO GROWTH
CULTURE: NO GROWTH
CULTURE: NO GROWTH
Culture: NO GROWTH

## 2015-01-29 ENCOUNTER — Ambulatory Visit (INDEPENDENT_AMBULATORY_CARE_PROVIDER_SITE_OTHER): Payer: Medicare Other | Admitting: Cardiovascular Disease

## 2015-01-29 DIAGNOSIS — Z5181 Encounter for therapeutic drug level monitoring: Secondary | ICD-10-CM

## 2015-01-29 DIAGNOSIS — Z7901 Long term (current) use of anticoagulants: Secondary | ICD-10-CM | POA: Diagnosis not present

## 2015-01-29 DIAGNOSIS — I4891 Unspecified atrial fibrillation: Secondary | ICD-10-CM | POA: Diagnosis not present

## 2015-01-29 DIAGNOSIS — I4819 Other persistent atrial fibrillation: Secondary | ICD-10-CM

## 2015-01-29 DIAGNOSIS — I481 Persistent atrial fibrillation: Secondary | ICD-10-CM

## 2015-01-29 LAB — POCT INR: INR: 2.9

## 2015-01-30 ENCOUNTER — Other Ambulatory Visit (HOSPITAL_COMMUNITY): Payer: Self-pay | Admitting: *Deleted

## 2015-02-02 ENCOUNTER — Encounter (HOSPITAL_COMMUNITY)
Admission: RE | Admit: 2015-02-02 | Discharge: 2015-02-02 | Disposition: A | Discharge status: Home / Self Care | Payer: Medicare Other | Source: Ambulatory Visit | Attending: Nephrology | Admitting: Nephrology

## 2015-02-02 ENCOUNTER — Ambulatory Visit (INDEPENDENT_AMBULATORY_CARE_PROVIDER_SITE_OTHER): Payer: Medicare Other | Admitting: Internal Medicine

## 2015-02-02 DIAGNOSIS — I481 Persistent atrial fibrillation: Secondary | ICD-10-CM

## 2015-02-02 DIAGNOSIS — N184 Chronic kidney disease, stage 4 (severe): Secondary | ICD-10-CM | POA: Diagnosis not present

## 2015-02-02 DIAGNOSIS — J159 Unspecified bacterial pneumonia: Secondary | ICD-10-CM | POA: Diagnosis not present

## 2015-02-02 DIAGNOSIS — Z5181 Encounter for therapeutic drug level monitoring: Secondary | ICD-10-CM

## 2015-02-02 DIAGNOSIS — I4819 Other persistent atrial fibrillation: Secondary | ICD-10-CM

## 2015-02-02 LAB — POCT INR: INR: 2.4

## 2015-02-02 MED ORDER — SODIUM CHLORIDE 0.9 % IV SOLN
510.0000 mg | INTRAVENOUS | Status: AC
  Administered 2015-02-02: 510 mg via INTRAVENOUS
  Filled 2015-02-02: qty 17

## 2015-02-09 ENCOUNTER — Encounter (HOSPITAL_COMMUNITY)
Admission: RE | Admit: 2015-02-09 | Discharge: 2015-02-09 | Disposition: A | Discharge status: Home / Self Care | Payer: Medicare Other | Source: Ambulatory Visit | Attending: Nephrology | Admitting: Nephrology

## 2015-02-09 DIAGNOSIS — N184 Chronic kidney disease, stage 4 (severe): Secondary | ICD-10-CM | POA: Diagnosis not present

## 2015-02-09 LAB — POCT HEMOGLOBIN-HEMACUE: HEMOGLOBIN: 11.3 g/dL — AB (ref 12.0–15.0)

## 2015-02-09 LAB — IRON AND TIBC
Iron: 54 ug/dL (ref 28–170)
Saturation Ratios: 25 % (ref 10.4–31.8)
TIBC: 218 ug/dL — ABNORMAL LOW (ref 250–450)
UIBC: 164 ug/dL

## 2015-02-09 LAB — FERRITIN: FERRITIN: 1159 ng/mL — AB (ref 11–307)

## 2015-02-09 MED ORDER — EPOETIN ALFA 10000 UNIT/ML IJ SOLN
INTRAMUSCULAR | Status: AC
  Filled 2015-02-09: qty 1

## 2015-02-09 MED ORDER — EPOETIN ALFA 10000 UNIT/ML IJ SOLN
10000.0000 [IU] | INTRAMUSCULAR | Status: DC
  Administered 2015-02-09: 10000 [IU] via SUBCUTANEOUS

## 2015-02-16 ENCOUNTER — Ambulatory Visit (INDEPENDENT_AMBULATORY_CARE_PROVIDER_SITE_OTHER): Payer: Medicare Other | Admitting: Pharmacist

## 2015-02-16 DIAGNOSIS — Z5181 Encounter for therapeutic drug level monitoring: Secondary | ICD-10-CM

## 2015-02-16 DIAGNOSIS — I4819 Other persistent atrial fibrillation: Secondary | ICD-10-CM

## 2015-02-16 DIAGNOSIS — I481 Persistent atrial fibrillation: Secondary | ICD-10-CM

## 2015-02-16 LAB — POCT INR: INR: 2.5

## 2015-02-17 DIAGNOSIS — J189 Pneumonia, unspecified organism: Secondary | ICD-10-CM | POA: Diagnosis not present

## 2015-02-17 DIAGNOSIS — Z23 Encounter for immunization: Secondary | ICD-10-CM | POA: Diagnosis not present

## 2015-02-17 DIAGNOSIS — N184 Chronic kidney disease, stage 4 (severe): Secondary | ICD-10-CM | POA: Diagnosis not present

## 2015-02-17 DIAGNOSIS — E1122 Type 2 diabetes mellitus with diabetic chronic kidney disease: Secondary | ICD-10-CM | POA: Diagnosis not present

## 2015-03-02 ENCOUNTER — Ambulatory Visit (INDEPENDENT_AMBULATORY_CARE_PROVIDER_SITE_OTHER): Payer: Medicare Other | Admitting: Cardiovascular Disease

## 2015-03-02 DIAGNOSIS — I481 Persistent atrial fibrillation: Secondary | ICD-10-CM

## 2015-03-02 DIAGNOSIS — I4819 Other persistent atrial fibrillation: Secondary | ICD-10-CM

## 2015-03-02 DIAGNOSIS — Z5181 Encounter for therapeutic drug level monitoring: Secondary | ICD-10-CM

## 2015-03-02 LAB — POCT INR: INR: 2

## 2015-03-05 DIAGNOSIS — R404 Transient alteration of awareness: Secondary | ICD-10-CM | POA: Diagnosis not present

## 2015-03-05 DIAGNOSIS — R42 Dizziness and giddiness: Secondary | ICD-10-CM | POA: Diagnosis not present

## 2015-03-05 DIAGNOSIS — F419 Anxiety disorder, unspecified: Secondary | ICD-10-CM | POA: Diagnosis not present

## 2015-03-16 ENCOUNTER — Ambulatory Visit (INDEPENDENT_AMBULATORY_CARE_PROVIDER_SITE_OTHER): Payer: Medicare Other | Admitting: Cardiology

## 2015-03-16 ENCOUNTER — Encounter (HOSPITAL_COMMUNITY)
Admission: RE | Admit: 2015-03-16 | Discharge: 2015-03-16 | Disposition: A | Discharge status: Home / Self Care | Payer: Medicare Other | Source: Ambulatory Visit | Attending: Nephrology | Admitting: Nephrology

## 2015-03-16 DIAGNOSIS — I4819 Other persistent atrial fibrillation: Secondary | ICD-10-CM

## 2015-03-16 DIAGNOSIS — Z7901 Long term (current) use of anticoagulants: Secondary | ICD-10-CM | POA: Diagnosis not present

## 2015-03-16 DIAGNOSIS — I4891 Unspecified atrial fibrillation: Secondary | ICD-10-CM | POA: Diagnosis not present

## 2015-03-16 DIAGNOSIS — N184 Chronic kidney disease, stage 4 (severe): Secondary | ICD-10-CM | POA: Diagnosis not present

## 2015-03-16 DIAGNOSIS — J189 Pneumonia, unspecified organism: Secondary | ICD-10-CM | POA: Diagnosis not present

## 2015-03-16 DIAGNOSIS — I481 Persistent atrial fibrillation: Secondary | ICD-10-CM

## 2015-03-16 DIAGNOSIS — Z5181 Encounter for therapeutic drug level monitoring: Secondary | ICD-10-CM

## 2015-03-16 LAB — IRON AND TIBC
Iron: 63 ug/dL (ref 28–170)
SATURATION RATIOS: 28 % (ref 10.4–31.8)
TIBC: 221 ug/dL — ABNORMAL LOW (ref 250–450)
UIBC: 158 ug/dL

## 2015-03-16 LAB — POCT HEMOGLOBIN-HEMACUE: HEMOGLOBIN: 11.8 g/dL — AB (ref 12.0–15.0)

## 2015-03-16 LAB — FERRITIN: Ferritin: 864 ng/mL — ABNORMAL HIGH (ref 11–307)

## 2015-03-16 LAB — POCT INR: INR: 2.9

## 2015-03-16 MED ORDER — EPOETIN ALFA 10000 UNIT/ML IJ SOLN
INTRAMUSCULAR | Status: AC
  Filled 2015-03-16: qty 1

## 2015-03-16 MED ORDER — EPOETIN ALFA 10000 UNIT/ML IJ SOLN
10000.0000 [IU] | INTRAMUSCULAR | Status: DC
  Administered 2015-03-16: 10000 [IU] via SUBCUTANEOUS

## 2015-03-18 ENCOUNTER — Encounter: Payer: Self-pay | Admitting: Cardiology

## 2015-03-18 ENCOUNTER — Ambulatory Visit (INDEPENDENT_AMBULATORY_CARE_PROVIDER_SITE_OTHER): Payer: Medicare Other | Admitting: Cardiology

## 2015-03-18 VITALS — BP 136/46 | HR 73 | Ht 61.0 in | Wt 198.8 lb

## 2015-03-18 DIAGNOSIS — I5032 Chronic diastolic (congestive) heart failure: Secondary | ICD-10-CM | POA: Diagnosis not present

## 2015-03-18 DIAGNOSIS — I4819 Other persistent atrial fibrillation: Secondary | ICD-10-CM

## 2015-03-18 DIAGNOSIS — I481 Persistent atrial fibrillation: Secondary | ICD-10-CM | POA: Diagnosis not present

## 2015-03-18 NOTE — Progress Notes (Addendum)
[facility]. [address], [place]  [address] Phone: [phone] Fax:  [phone]  Date:  03/18/2015   ID:  Madeline [name], DOB 1925-10-27, MRN [id number]  PCP:  Madeline Miyamoto, MD   History of Present Illness: Madeline [name] is a 81 y.o. female with chronic diastolic heart failure on occasional metolazone with chronic atrial fibrillation rate controlled, chronic kidney disease stage IV. Had a fall in July 2015. On Coumadin.  Overall doing well.  However, she recently was hospitalized in September 2016 with right middle lobe pneumonia. This is subsequently cleared. She had an echocardiogram at that time which showed normal ejection fraction. She also had an episode of hypoglycemia which resulted in diaphoresis and increased heart rate.   She knows to try to keep her weight below 200 pounds. She will take an occasional metolazone when this happens, usually once a week. Dr. Justin [name] had been monitoring her renal function on a yearly basis.   No bleeding, no syncope. No recent falls. Her husband died at 3 had dementia. Her daughters help out significantly. Still having knee pain.   Wt Readings from Last 3 Encounters:  03/18/15 198 lb 12.8 oz (90.175 kg)  01/26/15 207 lb 0.2 oz (93.9 kg)  09/16/14 202 lb (91.627 kg)     Past Medical History  Diagnosis Date  . Hypertension   . Diabetes mellitus   . CHF (congestive heart failure) ([place])   . Thyroid disease   . Atrial fibrillation ([place])   . Kidney failure   . Anemia 11/07/2012    Past Surgical History  Procedure Laterality Date  . Abdominal hysterectomy    . Thyroid surgery      Current Outpatient Prescriptions  Medication Sig Dispense Refill  . amLODipine (NORVASC) 10 MG tablet Take 10 mg by mouth daily.    . carvedilol (COREG) 6.25 MG tablet Take 6.25 mg by mouth 2 (two) times daily with a meal.    . cholecalciferol (VITAMIN D) 1000 UNITS tablet Take 1,000 Units by mouth daily.    Marland Kitchen docusate sodium 100 MG CAPS  Take 100 mg by mouth 2 (two) times daily. OTC 10 capsule 0  . ferrous sulfate 325 (65 FE) MG tablet Take 1 tablet (325 mg total) by mouth 3 (three) times daily with meals.    . furosemide (LASIX) 40 MG tablet Take 3 tablets (120 mg total) by mouth 2 (two) times daily. 180 tablet 0  . glimepiride (AMARYL) 4 MG tablet Take 4 mg by mouth 2 (two) times daily.    . isosorbide mononitrate (IMDUR) 30 MG 24 hr tablet Take 30 mg by mouth daily.    Marland Kitchen levothyroxine (SYNTHROID, LEVOTHROID) 175 MCG tablet Take 200 mcg by mouth daily. Takes 2 tablets on Sunday.  5  . metolazone (ZAROXOLYN) 2.5 MG tablet Take 2.5 mg once a week if needed (Patient taking differently: Take 2.5 mg by mouth once a week. Take 2.5 mg once a week if needed) 30 tablet 6  . mometasone (ELOCON) 0.1 % cream Apply 1 application topically daily.   0  . nystatin-triamcinolone (MYCOLOG II) cream Apply 1 application topically 2 (two) times daily.    . pantoprazole (PROTONIX) 40 MG tablet Take 40 mg by mouth 2 (two) times daily.    . potassium chloride SA (K-DUR,KLOR-CON) 20 MEQ tablet Take 2 tablets (40 mEq total) by mouth 2 (two) times daily. 120 tablet 0  . pravastatin (PRAVACHOL) 40 MG tablet Take 40  mg by mouth at bedtime.     . senna (SENOKOT) 8.6 MG TABS Take 2 tablets (17.2 mg total) by mouth daily. otc (Patient taking differently: Take 2 tablets by mouth 2 (two) times daily. otc) 120 each 0  . silver sulfADIAZINE (SILVADENE) 1 % cream Apply 1 application topically daily.    . sitaGLIPtin (JANUVIA) 50 MG tablet Take 25 mg by mouth daily. Take half a tablet daily.    [name] 300 UNIT/ML SOPN 31 Units at bedtime.     . traMADol (ULTRAM) 50 MG tablet Take 1 tablet (50 mg total) by mouth every 6 (six) hours as needed for pain. 30 tablet 0  . warfarin (COUMADIN) 3 MG tablet TAKE AS DIRECTED (Patient taking differently: 9 mg on Wednesday and Saturday and 6 mg on all other days) 80 tablet 3  . nitroGLYCERIN (NITROSTAT) 0.4 MG SL tablet  Place 0.4 mg under the tongue every 5 (five) minutes as needed for chest pain.     No current facility-administered medications for this visit.    Allergies:   No Known Allergies  Social History:  The patient  reports that she has never smoked. She has never used smokeless tobacco. She reports that she does not drink alcohol or use illicit drugs.   Family History  Problem Relation Age of Onset  . Heart failure Neg Hx   . Heart attack Neg Hx   . Sudden death Neg Hx   . Stroke Neg Hx    Currently noncontributory family history.  ROS:  Please see the history of present illness.   No chest pain, no syncope, no bleeding, no orthopnea currently.   All other systems reviewed and negative.   PHYSICAL EXAM: VS:  BP 136/46 mmHg  Pulse 73  Ht 5\' 1"  (1.549 m)  Wt 198 lb 12.8 oz (90.175 kg)  BMI 37.58 kg/m2  SpO2 96% Well nourished, well developed, in no acute distress HEENT: normal, [place]/AT, EOMI Neck: no JVD, normal carotid upstroke, no bruit Cardiac:  normal S1, S2;irregularly irregular; no murmur Lungs:  clear to auscultation bilaterally, no wheezing, rhonchi or rales Abd: soft, nontender, no hepatomegaly, no bruits Ext: no edema, 2+ distal pulses Skin: warm and dry GU: deferred Neuro: no focal abnormalities noted, AAO x 3  EKG:  Atrial fibrillation heart rate 63 with no other abnormalities.    ASSESSMENT AND PLAN:  1. Persistent atrial fibrillation-continue with good rate control. No changes made. Doing well. Her heart rate is very steady today. Well controlled. 2. Chronic anticoagulation-  to monitor Coumadin closely.. 3. Chronic diastolic heart failure-maintain weight. Occasional metolazone, she states that she takes this once a week. No changes made. Fluid, salt restriction. 4. Hypertension-under reasonable control. Medications reviewed. Weight has been stable. 5.  diabetes- trying to avoid hypoglycemia. 6. 12 month follow-up.  Signed, Madeline Furbish, MD [facility]  03/18/2015  10:11 AM

## 2015-03-18 NOTE — Patient Instructions (Signed)

## 2015-03-27 ENCOUNTER — Ambulatory Visit (INDEPENDENT_AMBULATORY_CARE_PROVIDER_SITE_OTHER): Payer: Medicare Other | Admitting: Internal Medicine

## 2015-03-27 DIAGNOSIS — I481 Persistent atrial fibrillation: Secondary | ICD-10-CM

## 2015-03-27 DIAGNOSIS — I4819 Other persistent atrial fibrillation: Secondary | ICD-10-CM

## 2015-03-27 DIAGNOSIS — Z5181 Encounter for therapeutic drug level monitoring: Secondary | ICD-10-CM

## 2015-03-27 LAB — POCT INR: INR: 2.7

## 2015-04-10 ENCOUNTER — Encounter: Payer: Self-pay | Admitting: Cardiovascular Disease

## 2015-04-10 ENCOUNTER — Ambulatory Visit (INDEPENDENT_AMBULATORY_CARE_PROVIDER_SITE_OTHER): Payer: Medicare Other | Admitting: Cardiovascular Disease

## 2015-04-10 DIAGNOSIS — Z5181 Encounter for therapeutic drug level monitoring: Secondary | ICD-10-CM

## 2015-04-10 DIAGNOSIS — I481 Persistent atrial fibrillation: Secondary | ICD-10-CM

## 2015-04-10 DIAGNOSIS — I4819 Other persistent atrial fibrillation: Secondary | ICD-10-CM

## 2015-04-10 LAB — PROTIME-INR: INR: 2.6 — AB (ref 0.9–1.1)

## 2015-04-10 NOTE — Progress Notes (Signed)
This encounter was created in error - please disregard.

## 2015-04-13 ENCOUNTER — Encounter (HOSPITAL_COMMUNITY)
Admission: RE | Admit: 2015-04-13 | Discharge: 2015-04-13 | Disposition: A | Discharge status: Home / Self Care | Payer: Medicare Other | Source: Ambulatory Visit | Attending: Nephrology | Admitting: Nephrology

## 2015-04-13 DIAGNOSIS — N184 Chronic kidney disease, stage 4 (severe): Secondary | ICD-10-CM | POA: Diagnosis not present

## 2015-04-13 LAB — IRON AND TIBC
IRON: 53 ug/dL (ref 28–170)
Saturation Ratios: 23 % (ref 10.4–31.8)
TIBC: 227 ug/dL — ABNORMAL LOW (ref 250–450)
UIBC: 174 ug/dL

## 2015-04-13 LAB — POCT HEMOGLOBIN-HEMACUE: Hemoglobin: 11.1 g/dL — ABNORMAL LOW (ref 12.0–15.0)

## 2015-04-13 LAB — FERRITIN: FERRITIN: 772 ng/mL — AB (ref 11–307)

## 2015-04-13 MED ORDER — EPOETIN ALFA 10000 UNIT/ML IJ SOLN
10000.0000 [IU] | INTRAMUSCULAR | Status: DC
  Administered 2015-04-13: 10000 [IU] via SUBCUTANEOUS

## 2015-04-13 MED ORDER — EPOETIN ALFA 10000 UNIT/ML IJ SOLN
INTRAMUSCULAR | Status: AC
  Administered 2015-04-13: 10000 [IU] via SUBCUTANEOUS
  Filled 2015-04-13: qty 1

## 2015-04-24 ENCOUNTER — Ambulatory Visit (INDEPENDENT_AMBULATORY_CARE_PROVIDER_SITE_OTHER): Payer: Medicare Other | Admitting: Internal Medicine

## 2015-04-24 DIAGNOSIS — I4819 Other persistent atrial fibrillation: Secondary | ICD-10-CM

## 2015-04-24 DIAGNOSIS — Z5181 Encounter for therapeutic drug level monitoring: Secondary | ICD-10-CM

## 2015-04-24 DIAGNOSIS — I481 Persistent atrial fibrillation: Secondary | ICD-10-CM

## 2015-04-24 LAB — POCT INR: INR: 1.6

## 2015-05-05 ENCOUNTER — Emergency Department (HOSPITAL_BASED_OUTPATIENT_CLINIC_OR_DEPARTMENT_OTHER)
Admission: EM | Admit: 2015-05-05 | Discharge: 2015-05-05 | Discharge status: Against Medical Advice | Payer: Medicare Other | Attending: Emergency Medicine | Admitting: Emergency Medicine

## 2015-05-05 ENCOUNTER — Encounter (HOSPITAL_BASED_OUTPATIENT_CLINIC_OR_DEPARTMENT_OTHER): Payer: Self-pay | Admitting: *Deleted

## 2015-05-05 ENCOUNTER — Emergency Department (HOSPITAL_BASED_OUTPATIENT_CLINIC_OR_DEPARTMENT_OTHER): Payer: Medicare Other

## 2015-05-05 DIAGNOSIS — R05 Cough: Secondary | ICD-10-CM | POA: Diagnosis not present

## 2015-05-05 DIAGNOSIS — I4891 Unspecified atrial fibrillation: Secondary | ICD-10-CM | POA: Diagnosis not present

## 2015-05-05 DIAGNOSIS — E119 Type 2 diabetes mellitus without complications: Secondary | ICD-10-CM | POA: Diagnosis not present

## 2015-05-05 DIAGNOSIS — I10 Essential (primary) hypertension: Secondary | ICD-10-CM | POA: Insufficient documentation

## 2015-05-05 DIAGNOSIS — I509 Heart failure, unspecified: Secondary | ICD-10-CM | POA: Insufficient documentation

## 2015-05-05 DIAGNOSIS — R509 Fever, unspecified: Secondary | ICD-10-CM | POA: Insufficient documentation

## 2015-05-05 NOTE — ED Notes (Signed)
Daughter states they are tired of waiting and are leaving

## 2015-05-05 NOTE — ED Notes (Signed)
Pt c/o URi symptoms and fever  x 3 days

## 2015-05-08 DIAGNOSIS — I4891 Unspecified atrial fibrillation: Secondary | ICD-10-CM | POA: Diagnosis not present

## 2015-05-08 DIAGNOSIS — Z7901 Long term (current) use of anticoagulants: Secondary | ICD-10-CM | POA: Diagnosis not present

## 2015-05-08 LAB — POCT INR: INR: 2.7

## 2015-05-12 ENCOUNTER — Ambulatory Visit (INDEPENDENT_AMBULATORY_CARE_PROVIDER_SITE_OTHER): Payer: Medicare Other | Admitting: Internal Medicine

## 2015-05-12 ENCOUNTER — Encounter (HOSPITAL_COMMUNITY): Payer: Medicare Other

## 2015-05-12 DIAGNOSIS — I481 Persistent atrial fibrillation: Secondary | ICD-10-CM

## 2015-05-12 DIAGNOSIS — I4819 Other persistent atrial fibrillation: Secondary | ICD-10-CM

## 2015-05-12 DIAGNOSIS — Z5181 Encounter for therapeutic drug level monitoring: Secondary | ICD-10-CM

## 2015-05-19 ENCOUNTER — Inpatient Hospital Stay (HOSPITAL_COMMUNITY): Admission: RE | Admit: 2015-05-19 | Payer: Medicare Other | Source: Ambulatory Visit

## 2015-05-21 ENCOUNTER — Encounter (HOSPITAL_COMMUNITY)
Admission: RE | Admit: 2015-05-21 | Discharge: 2015-05-21 | Disposition: A | Discharge status: Home / Self Care | Payer: Medicare Other | Source: Ambulatory Visit | Attending: Nephrology | Admitting: Nephrology

## 2015-05-21 DIAGNOSIS — N184 Chronic kidney disease, stage 4 (severe): Secondary | ICD-10-CM | POA: Diagnosis not present

## 2015-05-21 LAB — IRON AND TIBC
Iron: 77 ug/dL (ref 28–170)
SATURATION RATIOS: 39 % — AB (ref 10.4–31.8)
TIBC: 199 ug/dL — ABNORMAL LOW (ref 250–450)
UIBC: 122 ug/dL

## 2015-05-21 LAB — POCT HEMOGLOBIN-HEMACUE: HEMOGLOBIN: 11.8 g/dL — AB (ref 12.0–15.0)

## 2015-05-21 LAB — FERRITIN: Ferritin: 757 ng/mL — ABNORMAL HIGH (ref 11–307)

## 2015-05-21 MED ORDER — EPOETIN ALFA 10000 UNIT/ML IJ SOLN
10000.0000 [IU] | INTRAMUSCULAR | Status: DC
  Administered 2015-05-21: 10000 [IU] via SUBCUTANEOUS

## 2015-05-21 MED ORDER — EPOETIN ALFA 10000 UNIT/ML IJ SOLN
INTRAMUSCULAR | Status: AC
  Filled 2015-05-21: qty 1

## 2015-05-22 ENCOUNTER — Ambulatory Visit (INDEPENDENT_AMBULATORY_CARE_PROVIDER_SITE_OTHER): Payer: Medicare Other | Admitting: Pharmacist

## 2015-05-22 DIAGNOSIS — Z5181 Encounter for therapeutic drug level monitoring: Secondary | ICD-10-CM

## 2015-05-22 DIAGNOSIS — I481 Persistent atrial fibrillation: Secondary | ICD-10-CM

## 2015-05-22 DIAGNOSIS — I4819 Other persistent atrial fibrillation: Secondary | ICD-10-CM

## 2015-05-22 LAB — POCT INR: INR: 2.9

## 2015-06-04 ENCOUNTER — Other Ambulatory Visit: Payer: Self-pay | Admitting: Cardiology

## 2015-06-08 ENCOUNTER — Ambulatory Visit (INDEPENDENT_AMBULATORY_CARE_PROVIDER_SITE_OTHER): Payer: Medicare Other | Admitting: Pharmacist

## 2015-06-08 DIAGNOSIS — I481 Persistent atrial fibrillation: Secondary | ICD-10-CM

## 2015-06-08 DIAGNOSIS — Z5181 Encounter for therapeutic drug level monitoring: Secondary | ICD-10-CM

## 2015-06-08 DIAGNOSIS — I4819 Other persistent atrial fibrillation: Secondary | ICD-10-CM

## 2015-06-08 LAB — POCT INR: INR: 2.2

## 2015-06-15 ENCOUNTER — Encounter (HOSPITAL_COMMUNITY)
Admission: RE | Admit: 2015-06-15 | Discharge: 2015-06-15 | Disposition: A | Discharge status: Home / Self Care | Payer: Medicare Other | Source: Ambulatory Visit | Attending: Nephrology | Admitting: Nephrology

## 2015-06-15 DIAGNOSIS — N184 Chronic kidney disease, stage 4 (severe): Secondary | ICD-10-CM

## 2015-06-15 LAB — IRON AND TIBC
IRON: 54 ug/dL (ref 28–170)
SATURATION RATIOS: 26 % (ref 10.4–31.8)
TIBC: 209 ug/dL — ABNORMAL LOW (ref 250–450)
UIBC: 155 ug/dL

## 2015-06-15 LAB — FERRITIN: FERRITIN: 877 ng/mL — AB (ref 11–307)

## 2015-06-15 LAB — POCT HEMOGLOBIN-HEMACUE: HEMOGLOBIN: 11.6 g/dL — AB (ref 12.0–15.0)

## 2015-06-15 MED ORDER — EPOETIN ALFA 10000 UNIT/ML IJ SOLN
10000.0000 [IU] | INTRAMUSCULAR | Status: DC
  Administered 2015-06-15: 10000 [IU] via SUBCUTANEOUS

## 2015-06-15 MED ORDER — EPOETIN ALFA 10000 UNIT/ML IJ SOLN
INTRAMUSCULAR | Status: AC
  Filled 2015-06-15: qty 1

## 2015-06-19 DIAGNOSIS — N189 Chronic kidney disease, unspecified: Secondary | ICD-10-CM | POA: Diagnosis not present

## 2015-06-19 DIAGNOSIS — N184 Chronic kidney disease, stage 4 (severe): Secondary | ICD-10-CM | POA: Diagnosis not present

## 2015-06-19 DIAGNOSIS — D631 Anemia in chronic kidney disease: Secondary | ICD-10-CM | POA: Diagnosis not present

## 2015-06-19 DIAGNOSIS — N2581 Secondary hyperparathyroidism of renal origin: Secondary | ICD-10-CM | POA: Diagnosis not present

## 2015-06-22 ENCOUNTER — Ambulatory Visit (INDEPENDENT_AMBULATORY_CARE_PROVIDER_SITE_OTHER): Payer: Medicare Other | Admitting: Internal Medicine

## 2015-06-22 DIAGNOSIS — I4819 Other persistent atrial fibrillation: Secondary | ICD-10-CM

## 2015-06-22 DIAGNOSIS — Z5181 Encounter for therapeutic drug level monitoring: Secondary | ICD-10-CM

## 2015-06-22 DIAGNOSIS — I481 Persistent atrial fibrillation: Secondary | ICD-10-CM

## 2015-06-22 LAB — POCT INR: INR: 2.7

## 2015-07-06 ENCOUNTER — Ambulatory Visit (INDEPENDENT_AMBULATORY_CARE_PROVIDER_SITE_OTHER): Payer: Medicare Other | Admitting: Cardiology

## 2015-07-06 DIAGNOSIS — I481 Persistent atrial fibrillation: Secondary | ICD-10-CM

## 2015-07-06 DIAGNOSIS — Z5181 Encounter for therapeutic drug level monitoring: Secondary | ICD-10-CM

## 2015-07-06 DIAGNOSIS — I4819 Other persistent atrial fibrillation: Secondary | ICD-10-CM

## 2015-07-06 DIAGNOSIS — I4891 Unspecified atrial fibrillation: Secondary | ICD-10-CM | POA: Diagnosis not present

## 2015-07-06 DIAGNOSIS — Z7901 Long term (current) use of anticoagulants: Secondary | ICD-10-CM | POA: Diagnosis not present

## 2015-07-06 LAB — POCT INR: INR: 3.1

## 2015-07-10 ENCOUNTER — Other Ambulatory Visit (HOSPITAL_COMMUNITY): Payer: Self-pay | Admitting: *Deleted

## 2015-07-13 ENCOUNTER — Encounter (HOSPITAL_COMMUNITY)
Admission: RE | Admit: 2015-07-13 | Discharge: 2015-07-13 | Disposition: A | Discharge status: Home / Self Care | Payer: Medicare Other | Source: Ambulatory Visit | Attending: Nephrology | Admitting: Nephrology

## 2015-07-13 DIAGNOSIS — N184 Chronic kidney disease, stage 4 (severe): Secondary | ICD-10-CM | POA: Diagnosis not present

## 2015-07-13 LAB — IRON AND TIBC
Iron: 70 ug/dL (ref 28–170)
Saturation Ratios: 30 % (ref 10.4–31.8)
TIBC: 230 ug/dL — ABNORMAL LOW (ref 250–450)
UIBC: 160 ug/dL

## 2015-07-13 LAB — POCT HEMOGLOBIN-HEMACUE: HEMOGLOBIN: 11.3 g/dL — AB (ref 12.0–15.0)

## 2015-07-13 LAB — FERRITIN: FERRITIN: 724 ng/mL — AB (ref 11–307)

## 2015-07-13 MED ORDER — EPOETIN ALFA 10000 UNIT/ML IJ SOLN
10000.0000 [IU] | INTRAMUSCULAR | Status: DC
  Administered 2015-07-13: 10000 [IU] via SUBCUTANEOUS

## 2015-07-13 MED ORDER — EPOETIN ALFA 10000 UNIT/ML IJ SOLN
INTRAMUSCULAR | Status: AC
  Filled 2015-07-13: qty 1

## 2015-07-20 ENCOUNTER — Ambulatory Visit (INDEPENDENT_AMBULATORY_CARE_PROVIDER_SITE_OTHER): Payer: Medicare Other | Admitting: Cardiovascular Disease

## 2015-07-20 DIAGNOSIS — Z5181 Encounter for therapeutic drug level monitoring: Secondary | ICD-10-CM

## 2015-07-20 DIAGNOSIS — I481 Persistent atrial fibrillation: Secondary | ICD-10-CM

## 2015-07-20 DIAGNOSIS — I4819 Other persistent atrial fibrillation: Secondary | ICD-10-CM

## 2015-07-20 LAB — POCT INR: INR: 2.1

## 2015-07-27 DIAGNOSIS — S161XXA Strain of muscle, fascia and tendon at neck level, initial encounter: Secondary | ICD-10-CM | POA: Diagnosis not present

## 2015-07-27 DIAGNOSIS — R05 Cough: Secondary | ICD-10-CM | POA: Diagnosis not present

## 2015-07-27 DIAGNOSIS — J329 Chronic sinusitis, unspecified: Secondary | ICD-10-CM | POA: Diagnosis not present

## 2015-07-30 LAB — POCT INR: INR: 2.5

## 2015-07-31 ENCOUNTER — Ambulatory Visit (INDEPENDENT_AMBULATORY_CARE_PROVIDER_SITE_OTHER): Payer: Medicare Other | Admitting: Cardiology

## 2015-07-31 DIAGNOSIS — I4819 Other persistent atrial fibrillation: Secondary | ICD-10-CM

## 2015-07-31 DIAGNOSIS — Z5181 Encounter for therapeutic drug level monitoring: Secondary | ICD-10-CM

## 2015-07-31 DIAGNOSIS — I481 Persistent atrial fibrillation: Secondary | ICD-10-CM

## 2015-08-03 ENCOUNTER — Emergency Department (HOSPITAL_BASED_OUTPATIENT_CLINIC_OR_DEPARTMENT_OTHER): Payer: Medicare Other

## 2015-08-03 ENCOUNTER — Emergency Department (HOSPITAL_BASED_OUTPATIENT_CLINIC_OR_DEPARTMENT_OTHER)
Admission: EM | Admit: 2015-08-03 | Discharge: 2015-08-03 | Disposition: A | Discharge status: Home / Self Care | Payer: Medicare Other | Attending: Emergency Medicine | Admitting: Emergency Medicine

## 2015-08-03 ENCOUNTER — Ambulatory Visit (INDEPENDENT_AMBULATORY_CARE_PROVIDER_SITE_OTHER): Payer: Medicare Other | Admitting: Cardiology

## 2015-08-03 ENCOUNTER — Telehealth: Payer: Self-pay | Admitting: Cardiology

## 2015-08-03 ENCOUNTER — Encounter (HOSPITAL_BASED_OUTPATIENT_CLINIC_OR_DEPARTMENT_OTHER): Payer: Self-pay | Admitting: *Deleted

## 2015-08-03 DIAGNOSIS — Z7984 Long term (current) use of oral hypoglycemic drugs: Secondary | ICD-10-CM | POA: Insufficient documentation

## 2015-08-03 DIAGNOSIS — Z87448 Personal history of other diseases of urinary system: Secondary | ICD-10-CM | POA: Diagnosis not present

## 2015-08-03 DIAGNOSIS — I4819 Other persistent atrial fibrillation: Secondary | ICD-10-CM

## 2015-08-03 DIAGNOSIS — E039 Hypothyroidism, unspecified: Secondary | ICD-10-CM | POA: Diagnosis not present

## 2015-08-03 DIAGNOSIS — D649 Anemia, unspecified: Secondary | ICD-10-CM | POA: Diagnosis not present

## 2015-08-03 DIAGNOSIS — R531 Weakness: Secondary | ICD-10-CM | POA: Insufficient documentation

## 2015-08-03 DIAGNOSIS — R05 Cough: Secondary | ICD-10-CM | POA: Diagnosis not present

## 2015-08-03 DIAGNOSIS — Z79899 Other long term (current) drug therapy: Secondary | ICD-10-CM | POA: Insufficient documentation

## 2015-08-03 DIAGNOSIS — Z5181 Encounter for therapeutic drug level monitoring: Secondary | ICD-10-CM

## 2015-08-03 DIAGNOSIS — I509 Heart failure, unspecified: Secondary | ICD-10-CM | POA: Diagnosis not present

## 2015-08-03 DIAGNOSIS — R51 Headache: Secondary | ICD-10-CM | POA: Diagnosis not present

## 2015-08-03 DIAGNOSIS — E119 Type 2 diabetes mellitus without complications: Secondary | ICD-10-CM | POA: Diagnosis not present

## 2015-08-03 DIAGNOSIS — Z7901 Long term (current) use of anticoagulants: Secondary | ICD-10-CM | POA: Insufficient documentation

## 2015-08-03 DIAGNOSIS — Z7952 Long term (current) use of systemic steroids: Secondary | ICD-10-CM | POA: Diagnosis not present

## 2015-08-03 DIAGNOSIS — I10 Essential (primary) hypertension: Secondary | ICD-10-CM | POA: Insufficient documentation

## 2015-08-03 DIAGNOSIS — I481 Persistent atrial fibrillation: Secondary | ICD-10-CM

## 2015-08-03 DIAGNOSIS — I4891 Unspecified atrial fibrillation: Secondary | ICD-10-CM | POA: Insufficient documentation

## 2015-08-03 LAB — COMPREHENSIVE METABOLIC PANEL
ALBUMIN: 3.8 g/dL (ref 3.5–5.0)
ALT: 18 U/L (ref 14–54)
ANION GAP: 12 (ref 5–15)
AST: 30 U/L (ref 15–41)
Alkaline Phosphatase: 72 U/L (ref 38–126)
BUN: 47 mg/dL — AB (ref 6–20)
CHLORIDE: 95 mmol/L — AB (ref 101–111)
CO2: 30 mmol/L (ref 22–32)
Calcium: 9.3 mg/dL (ref 8.9–10.3)
Creatinine, Ser: 1.98 mg/dL — ABNORMAL HIGH (ref 0.44–1.00)
GFR calc Af Amer: 25 mL/min — ABNORMAL LOW (ref >60)
GFR calc non Af Amer: 21 mL/min — ABNORMAL LOW (ref >60)
GLUCOSE: 279 mg/dL — AB (ref 65–99)
POTASSIUM: 3.4 mmol/L — AB (ref 3.5–5.1)
SODIUM: 137 mmol/L (ref 135–145)
TOTAL PROTEIN: 7.2 g/dL (ref 6.5–8.1)
Total Bilirubin: 0.5 mg/dL (ref 0.3–1.2)

## 2015-08-03 LAB — CBC WITH DIFFERENTIAL/PLATELET
BASOS ABS: 0.1 10*3/uL (ref 0.0–0.1)
Basophils Relative: 1 %
EOS ABS: 0.1 10*3/uL (ref 0.0–0.7)
Eosinophils Relative: 2 %
HEMATOCRIT: 35.8 % — AB (ref 36.0–46.0)
Hemoglobin: 11.9 g/dL — ABNORMAL LOW (ref 12.0–15.0)
LYMPHS ABS: 1.9 10*3/uL (ref 0.7–4.0)
Lymphocytes Relative: 29 %
MCH: 32.6 pg (ref 26.0–34.0)
MCHC: 33.2 g/dL (ref 30.0–36.0)
MCV: 98.1 fL (ref 78.0–100.0)
MONOS PCT: 8 %
Monocytes Absolute: 0.5 10*3/uL (ref 0.1–1.0)
NEUTROS ABS: 4.1 10*3/uL (ref 1.7–7.7)
Neutrophils Relative %: 60 %
PLATELETS: 174 10*3/uL (ref 150–400)
RBC: 3.65 MIL/uL — AB (ref 3.87–5.11)
RDW: 16.5 % — AB (ref 11.5–15.5)
WBC: 6.7 10*3/uL (ref 4.0–10.5)

## 2015-08-03 LAB — URINALYSIS, ROUTINE W REFLEX MICROSCOPIC
Bilirubin Urine: NEGATIVE
GLUCOSE, UA: NEGATIVE mg/dL
HGB URINE DIPSTICK: NEGATIVE
Ketones, ur: NEGATIVE mg/dL
LEUKOCYTES UA: NEGATIVE
Nitrite: NEGATIVE
PH: 6 (ref 5.0–8.0)
PROTEIN: NEGATIVE mg/dL
SPECIFIC GRAVITY, URINE: 1.009 (ref 1.005–1.030)

## 2015-08-03 LAB — POCT INR: INR: 1.8

## 2015-08-03 LAB — TSH: TSH: 3.626 u[IU]/mL (ref 0.350–4.500)

## 2015-08-03 LAB — MAGNESIUM: Magnesium: 2 mg/dL (ref 1.7–2.4)

## 2015-08-03 MED ORDER — SODIUM CHLORIDE 0.9 % IV BOLUS (SEPSIS)
500.0000 mL | Freq: Once | INTRAVENOUS | Status: AC
  Administered 2015-08-03: 500 mL via INTRAVENOUS

## 2015-08-03 NOTE — ED Notes (Signed)
D/C home with daughter. No new rx given

## 2015-08-03 NOTE — ED Notes (Signed)
Patient transported to CT and xray 

## 2015-08-03 NOTE — ED Notes (Signed)
MD at bedside. 

## 2015-08-03 NOTE — ED Provider Notes (Signed)
CSN: [id number]     Arrival date & time 08/03/15  1103 History   First MD Initiated Contact with Patient 08/03/15 1133     Chief Complaint  Patient presents with  . Weakness     (Consider location/radiation/quality/duration/timing/severity/associated sxs/prior Treatment) Patient is a 82 y.o. [name] presenting with weakness.  Weakness This is a new problem. The current episode started more than 1 week ago. The problem occurs constantly. The problem has been gradually worsening. Pertinent negatives include no chest pain and no headaches. Nothing aggravates the symptoms. Nothing relieves the symptoms. She has tried nothing for the symptoms. The treatment provided no relief.    Past Medical History  Diagnosis Date  . Hypertension   . Diabetes mellitus   . CHF (congestive heart failure) ([place])   . Thyroid disease   . Atrial fibrillation ([place])   . Kidney failure   . Anemia 11/07/2012   Past Surgical History  Procedure Laterality Date  . Abdominal hysterectomy    . Thyroid surgery     Family History  Problem Relation Age of Onset  . Heart failure Neg Hx   . Heart attack Neg Hx   . Sudden death Neg Hx   . Stroke Neg Hx    Social History  Substance Use Topics  . Smoking status: Never Smoker   . Smokeless tobacco: Never Used  . Alcohol Use: No   OB History    No data available     Review of Systems  Cardiovascular: Negative for chest pain.  Genitourinary: Negative for dysuria.  Neurological: Positive for weakness. Negative for dizziness, numbness and headaches.  All other systems reviewed and are negative.     Allergies  Review of patient's allergies indicates no known allergies.  Home Medications   Prior to Admission medications   Medication Sig Start Date End Date Taking? Authorizing Provider  amLODipine (NORVASC) 10 MG tablet Take 10 mg by mouth daily.    Historical Provider, MD  carvedilol (COREG) 6.25 MG tablet Take 6.25 mg by mouth 2 (two) times daily with a  meal.    Historical Provider, MD  cholecalciferol (VITAMIN D) 1000 UNITS tablet Take 1,000 Units by mouth daily.    Historical Provider, MD  docusate sodium 100 MG CAPS Take 100 mg by mouth 2 (two) times daily. OTC 11/13/12   Eugenie Filler, MD  ferrous sulfate 325 (65 FE) MG tablet Take 1 tablet (325 mg total) by mouth 3 (three) times daily with meals. 11/13/12   Eugenie Filler, MD  furosemide (LASIX) 40 MG tablet Take 3 tablets (120 mg total) by mouth 2 (two) times daily. 11/13/12   Eugenie Filler, MD  glimepiride (AMARYL) 4 MG tablet Take 4 mg by mouth 2 (two) times daily.    Historical Provider, MD  isosorbide mononitrate (IMDUR) 30 MG 24 hr tablet Take 30 mg by mouth daily.    Historical Provider, MD  levothyroxine (SYNTHROID, LEVOTHROID) 175 MCG tablet Take 200 mcg by mouth daily. Takes 2 tablets on Sunday. 02/27/14   Historical Provider, MD  metolazone (ZAROXOLYN) 2.5 MG tablet TAKE 1 TABLET BY MOUTH ONCE A WEEK IF NEEDED 06/04/15   Jerline Pain, MD  mometasone (ELOCON) 0.1 % cream Apply 1 application topically daily.  01/14/14   Historical Provider, MD  nitroGLYCERIN (NITROSTAT) 0.4 MG SL tablet Place 0.4 mg under the tongue every 5 (five) minutes as needed for chest pain.    Historical Provider, MD  nystatin-triamcinolone (MYCOLOG II) cream Apply  1 application topically 2 (two) times daily.    Historical Provider, MD  pantoprazole (PROTONIX) 40 MG tablet Take 40 mg by mouth 2 (two) times daily.    Historical Provider, MD  potassium chloride SA (K-DUR,KLOR-CON) 20 MEQ tablet Take 2 tablets (40 mEq total) by mouth 2 (two) times daily. 11/13/12   Eugenie Filler, MD  pravastatin (PRAVACHOL) 40 MG tablet Take 40 mg by mouth at bedtime.     Historical Provider, MD  senna (SENOKOT) 8.6 MG TABS Take 2 tablets (17.2 mg total) by mouth daily. otc Patient taking differently: Take 2 tablets by mouth 2 (two) times daily. otc 11/13/12   Eugenie Filler, MD  silver sulfADIAZINE (SILVADENE) 1 % cream  Apply 1 application topically daily.    Historical Provider, MD  sitaGLIPtin (JANUVIA) 50 MG tablet Take 25 mg by mouth daily. Take half a tablet daily.    Historical Provider, MD  TOUJEO SOLOSTAR 300 UNIT/ML SOPN 31 Units at bedtime.  08/24/14   Historical Provider, MD  traMADol (ULTRAM) 50 MG tablet Take 1 tablet (50 mg total) by mouth every 6 (six) hours as needed for pain. 10/05/12   Orson Eva, MD  warfarin (COUMADIN) 3 MG tablet TAKE AS DIRECTED 06/04/15   Jerline Pain, MD   BP 160/50 mmHg  Pulse 82  Temp(Src) 98.8 F (37.1 C) (Oral)  Resp 20  Ht 5' (1.524 m)  Wt 197 lb (89.359 kg)  BMI Madeline.47 kg/m2  SpO2 95% Physical Exam  Constitutional: She appears well-developed and well-nourished.  HENT:  Head: Normocephalic and atraumatic.  Neck: Normal range of motion.  Cardiovascular: Normal rate and regular rhythm.   Pulmonary/Chest: Effort normal and breath sounds normal. No stridor. No respiratory distress.  Abdominal: She exhibits no distension.  Genitourinary: Guaiac negative stool. No vaginal discharge found.  Musculoskeletal: She exhibits no edema or tenderness.  Neurological: She is alert.  Nursing note and vitals reviewed.   ED Course  Procedures (including critical care time) Labs Review Labs Reviewed - No data to display  Imaging Review No results found. I have personally reviewed and evaluated these images and lab results as part of my medical decision-making.   EKG Interpretation   Date/Time:  Monday August 03 2015 11:14:48 EDT Ventricular Rate:  79 PR Interval:    QRS Duration: 92 QT Interval:  394 QTC Calculation: 451 R Axis:   47 Text Interpretation:  Atrial fibrillation Low voltage QRS Cannot rule out  Anterior infarct , age undetermined Abnormal ECG Confirmed by [facility] MD,  Corene Cornea [phone]) on 08/03/2015 11:21:30 AM      MDM   Final diagnoses:  None   Pneumonia vs electrolyte abnormality vs dehydration. Will eval appropriately.  Labs and imaging  unremarkable. Symptoms improved slightly with fluid, felt to be close to baseline. No e/o sepsis, doubt cva with baseline neuro exam.   Will continue to follow up with PCP.     Merrily Pew, MD 08/04/15 [phone]

## 2015-08-03 NOTE — ED Notes (Signed)
Weakness for a week. She was treated for URI with an antibiotic. No better. Family states she is having body aches and slow heart rate. She is alert oriented.

## 2015-08-03 NOTE — Telephone Encounter (Signed)
New Message:    Pt was in the ER today for weakness and low heart rate. The ER doctor told her to contact her Cardiology,concerning lowering he dose of her Carvedilol.

## 2015-08-03 NOTE — Telephone Encounter (Signed)
Spoke with daughter who reports pt recently had a sinus infection and then was DX with a URI.  She has been on antibiotics that she should be finishing today or tomorrow.  Per daughter's report the pt has became increasingly weak since Wednesday.  They took her to the ED today and states she was told to contact the office about having one of her medications decreased d/t a slow HR.  Advised in review of the chart HR today was 82 bpm and 79 bpm on EKG.  This would not indicate a need to decrease her medication (carvedilol).  Advised pt's K+ was 3.4 today and she should continue KCL supplementation and eat foods high in potassium over the next several days.  She states understanding but will try to not give her foods high in sugar/carbs as she is diabetic as well.  Daughter reports pt has not been eating well at all because she hasn't had much of an appetite.  Advised to continue to monitor HR at home however pt is in At Fib (or was today AEB EKG) therefore they may not get an accurate reading with pulse oximetry.  Daughter states understanding and c/b with further concerns.  Will forward to Dr Marlou Porch for his knowledge.

## 2015-08-06 DIAGNOSIS — G4734 Idiopathic sleep related nonobstructive alveolar hypoventilation: Secondary | ICD-10-CM | POA: Diagnosis not present

## 2015-08-10 ENCOUNTER — Encounter (HOSPITAL_COMMUNITY)
Admission: RE | Admit: 2015-08-10 | Discharge: 2015-08-10 | Disposition: A | Discharge status: Home / Self Care | Payer: Medicare Other | Source: Ambulatory Visit | Attending: Nephrology | Admitting: Nephrology

## 2015-08-10 DIAGNOSIS — N184 Chronic kidney disease, stage 4 (severe): Secondary | ICD-10-CM | POA: Diagnosis not present

## 2015-08-10 LAB — IRON AND TIBC
IRON: 52 ug/dL (ref 28–170)
Saturation Ratios: 23 % (ref 10.4–31.8)
TIBC: 224 ug/dL — ABNORMAL LOW (ref 250–450)
UIBC: 172 ug/dL

## 2015-08-10 LAB — FERRITIN: Ferritin: 941 ng/mL — ABNORMAL HIGH (ref 11–307)

## 2015-08-10 LAB — POCT HEMOGLOBIN-HEMACUE: HEMOGLOBIN: 11.6 g/dL — AB (ref 12.0–15.0)

## 2015-08-10 MED ORDER — EPOETIN ALFA 10000 UNIT/ML IJ SOLN
INTRAMUSCULAR | Status: AC
  Filled 2015-08-10: qty 1

## 2015-08-10 MED ORDER — EPOETIN ALFA 10000 UNIT/ML IJ SOLN
10000.0000 [IU] | INTRAMUSCULAR | Status: DC
  Administered 2015-08-10: 10000 [IU] via SUBCUTANEOUS

## 2015-08-17 ENCOUNTER — Ambulatory Visit (INDEPENDENT_AMBULATORY_CARE_PROVIDER_SITE_OTHER): Payer: Medicare Other | Admitting: Internal Medicine

## 2015-08-17 DIAGNOSIS — Z5181 Encounter for therapeutic drug level monitoring: Secondary | ICD-10-CM

## 2015-08-17 DIAGNOSIS — I4819 Other persistent atrial fibrillation: Secondary | ICD-10-CM

## 2015-08-17 DIAGNOSIS — Z7901 Long term (current) use of anticoagulants: Secondary | ICD-10-CM | POA: Diagnosis not present

## 2015-08-17 DIAGNOSIS — I481 Persistent atrial fibrillation: Secondary | ICD-10-CM

## 2015-08-17 DIAGNOSIS — I4891 Unspecified atrial fibrillation: Secondary | ICD-10-CM | POA: Diagnosis not present

## 2015-08-17 LAB — POCT INR: INR: 4.8

## 2015-08-19 DIAGNOSIS — G4734 Idiopathic sleep related nonobstructive alveolar hypoventilation: Secondary | ICD-10-CM | POA: Diagnosis not present

## 2015-08-19 DIAGNOSIS — E1122 Type 2 diabetes mellitus with diabetic chronic kidney disease: Secondary | ICD-10-CM | POA: Diagnosis not present

## 2015-08-19 DIAGNOSIS — I5032 Chronic diastolic (congestive) heart failure: Secondary | ICD-10-CM | POA: Diagnosis not present

## 2015-08-19 DIAGNOSIS — Z7984 Long term (current) use of oral hypoglycemic drugs: Secondary | ICD-10-CM | POA: Diagnosis not present

## 2015-08-19 DIAGNOSIS — Z794 Long term (current) use of insulin: Secondary | ICD-10-CM | POA: Diagnosis not present

## 2015-08-19 DIAGNOSIS — N184 Chronic kidney disease, stage 4 (severe): Secondary | ICD-10-CM | POA: Diagnosis not present

## 2015-08-19 DIAGNOSIS — I2510 Atherosclerotic heart disease of native coronary artery without angina pectoris: Secondary | ICD-10-CM | POA: Diagnosis not present

## 2015-08-19 DIAGNOSIS — I482 Chronic atrial fibrillation: Secondary | ICD-10-CM | POA: Diagnosis not present

## 2015-08-19 DIAGNOSIS — E1142 Type 2 diabetes mellitus with diabetic polyneuropathy: Secondary | ICD-10-CM | POA: Diagnosis not present

## 2015-08-24 ENCOUNTER — Ambulatory Visit (INDEPENDENT_AMBULATORY_CARE_PROVIDER_SITE_OTHER): Payer: Medicare Other | Admitting: Cardiovascular Disease

## 2015-08-24 DIAGNOSIS — I481 Persistent atrial fibrillation: Secondary | ICD-10-CM

## 2015-08-24 DIAGNOSIS — I4819 Other persistent atrial fibrillation: Secondary | ICD-10-CM

## 2015-08-24 DIAGNOSIS — Z5181 Encounter for therapeutic drug level monitoring: Secondary | ICD-10-CM

## 2015-08-24 LAB — POCT INR: INR: 2.1

## 2015-08-27 ENCOUNTER — Telehealth: Payer: Self-pay | Admitting: Cardiology

## 2015-08-27 ENCOUNTER — Encounter: Payer: Self-pay | Admitting: Cardiovascular Disease

## 2015-08-27 NOTE — Telephone Encounter (Signed)
New message      PCP is putting pt on losartan 25mg  1/2 tab daily for something build up in her urine.  Is this medication ok with Dr Marlou Porch?

## 2015-08-27 NOTE — Telephone Encounter (Signed)
Pt's daughter called to see if it's okay with Dr. Marlou Porch for pt to take Losartan 25 mg 1/2 tablet daily for something build up in her urine. Daughter states that pt's  physician prescribing this medication is sending to Dr. Marlou Porch a note regarding this medication order. Pt is taking Norvasc 10 mg daily and Carvedilol 6.25 mg two times daily.

## 2015-08-27 NOTE — Telephone Encounter (Signed)
Yes this is okay for her to take a very low dose of losartan. This is likely being done secondary to small protein in her urine.  Candee Furbish, MD

## 2015-08-28 NOTE — Telephone Encounter (Signed)
Daughter is aware OK with Dr Marlou Porch.

## 2015-09-07 ENCOUNTER — Encounter (HOSPITAL_COMMUNITY)
Admission: RE | Admit: 2015-09-07 | Discharge: 2015-09-07 | Disposition: A | Discharge status: Home / Self Care | Payer: Medicare Other | Source: Ambulatory Visit | Attending: Nephrology | Admitting: Nephrology

## 2015-09-07 ENCOUNTER — Telehealth: Payer: Self-pay | Admitting: Cardiology

## 2015-09-07 ENCOUNTER — Ambulatory Visit (INDEPENDENT_AMBULATORY_CARE_PROVIDER_SITE_OTHER): Payer: Medicare Other | Admitting: Internal Medicine

## 2015-09-07 DIAGNOSIS — I481 Persistent atrial fibrillation: Secondary | ICD-10-CM

## 2015-09-07 DIAGNOSIS — N184 Chronic kidney disease, stage 4 (severe): Secondary | ICD-10-CM | POA: Diagnosis not present

## 2015-09-07 DIAGNOSIS — I4819 Other persistent atrial fibrillation: Secondary | ICD-10-CM

## 2015-09-07 DIAGNOSIS — Z5181 Encounter for therapeutic drug level monitoring: Secondary | ICD-10-CM

## 2015-09-07 LAB — IRON AND TIBC
Iron: 43 ug/dL (ref 28–170)
SATURATION RATIOS: 19 % (ref 10.4–31.8)
TIBC: 227 ug/dL — ABNORMAL LOW (ref 250–450)
UIBC: 184 ug/dL

## 2015-09-07 LAB — FERRITIN: Ferritin: 578 ng/mL — ABNORMAL HIGH (ref 11–307)

## 2015-09-07 LAB — POCT INR: INR: 2.4

## 2015-09-07 LAB — POCT HEMOGLOBIN-HEMACUE: HEMOGLOBIN: 11 g/dL — AB (ref 12.0–15.0)

## 2015-09-07 MED ORDER — EPOETIN ALFA 10000 UNIT/ML IJ SOLN
INTRAMUSCULAR | Status: AC
  Filled 2015-09-07: qty 1

## 2015-09-07 MED ORDER — EPOETIN ALFA 10000 UNIT/ML IJ SOLN
10000.0000 [IU] | INTRAMUSCULAR | Status: DC
  Administered 2015-09-07: 10000 [IU] via SUBCUTANEOUS

## 2015-09-07 NOTE — Telephone Encounter (Signed)
Spoke with daughter who is concerned about pt's wt gain.  She states she takes her Metolazone every Thursday and her wt does goes down slightly however by Monday it's back up.  Today it is at 202 lbs.  Pt does have swelling in her feet and ankles with it being worse in the left than the right.  She also c/o burning in her feet and some increase in abdominal girth.  She was recently started on 02 at night b/c her sat are dropping.  Her PCP started this.  Daughter is requesting further advice.  Reviewed low NA diet and the importance is monitoring the amount of NA she takes in daily.  Also reviewed to keep legs elevated during the day as much as possible and to continue daily weight.  Pt scheduled to see Dr Marlou Porch tomorrow at 3:30 for further evaluation.  Will forward to Dr Marlou Porch for his information.

## 2015-09-07 NOTE — Telephone Encounter (Signed)
New message ° ° ° ° °Returning a call to the nurse °

## 2015-09-08 ENCOUNTER — Ambulatory Visit (INDEPENDENT_AMBULATORY_CARE_PROVIDER_SITE_OTHER): Payer: Medicare Other | Admitting: Cardiology

## 2015-09-08 ENCOUNTER — Encounter: Payer: Self-pay | Admitting: Cardiology

## 2015-09-08 VITALS — BP 136/74 | HR 71 | Ht 62.0 in | Wt 204.0 lb

## 2015-09-08 DIAGNOSIS — Z7901 Long term (current) use of anticoagulants: Secondary | ICD-10-CM

## 2015-09-08 DIAGNOSIS — R600 Localized edema: Secondary | ICD-10-CM

## 2015-09-08 DIAGNOSIS — Z79899 Other long term (current) drug therapy: Secondary | ICD-10-CM | POA: Diagnosis not present

## 2015-09-08 DIAGNOSIS — I481 Persistent atrial fibrillation: Secondary | ICD-10-CM | POA: Diagnosis not present

## 2015-09-08 DIAGNOSIS — I5032 Chronic diastolic (congestive) heart failure: Secondary | ICD-10-CM

## 2015-09-08 DIAGNOSIS — I4819 Other persistent atrial fibrillation: Secondary | ICD-10-CM

## 2015-09-08 MED ORDER — METOLAZONE 2.5 MG PO TABS: 2.5000 mg | ORAL_TABLET | ORAL | Status: DC

## 2015-09-08 NOTE — Patient Instructions (Signed)
Medication Instructions:  Please increase Metolazone 2.5 mg to twice a week. Continue all other medications as listed.  Labwork: Please have blood work in 1 week (BMP).  Please limit fluid intake to no more than 1.5 liters or 1,500 ml a day.  Please wear compression hose daily and remove them at night.  Keep legs elevated during the day as much as possible.  Follow-Up: Follow up in 1 month with Dr Marlou Porch.  If you need a refill on your cardiac medications before your next appointment, please call your pharmacy.  Thank you for choosing [facility]!!

## 2015-09-08 NOTE — Progress Notes (Signed)
[facility]. [address], [address] [place], [place]  [address] Phone: [phone] Fax:  [phone]  Date:  09/08/2015   ID:  Madeline [name], DOB 1925/08/15, MRN [id number]  PCP:  Corine Shelter, PA-C   History of Present Illness: Madeline [name] is a 82 y.o. female with chronic diastolic heart failure on occasional metolazone with chronic atrial fibrillation rate controlled, chronic kidney disease stage IV, creatinine 1.98.     She has been gaining weight recently, telephone message relayed. She takes metolazone every Thursday and her weight goes down slightly but by Monday it is back up. 202 pounds currently. Notable swelling in her feet and ankles has been worse left greater than right. Burning in her feet. Increased abdominal girth. She is recently been started on oxygen. Low-sodium diet was discussed.  She does not like to wear her compression stockings because her legs are warm. Her edema builds up quite significantly in her feet. Her daughter was cutting her nails and clipped her toe and water was coming out.  Echocardiogram 01/2015 showed normal ejection fraction. She also had an episode of hypoglycemia which resulted in diaphoresis and increased heart rate.  She knows to try to keep her weight below 200 pounds. She will take an occasional metolazone when this happens, usually once a week. On 09/08/15, I increased the metolazone to twice a week as she is taking Lasix 120 mg twice a day. Dr. Justin Mend had been monitoring her renal function on a yearly basis.   No bleeding, no syncope. No recent falls. Her husband died at 64 had dementia. Her daughters help out significantly. Still having knee pain.   Wt Readings from Last 3 Encounters:  09/08/15 204 lb (92.534 kg)  08/03/15 197 lb (89.359 kg)  05/05/15 197 lb (89.359 kg)     Past Medical History  Diagnosis Date  . Hypertension   . Diabetes mellitus   . CHF (congestive heart failure) ([place])   . Thyroid disease   . Atrial fibrillation ([place])    . Kidney failure   . Anemia 11/07/2012    Past Surgical History  Procedure Laterality Date  . Abdominal hysterectomy    . Thyroid surgery      Current Outpatient Prescriptions  Medication Sig Dispense Refill  . amLODipine (NORVASC) 10 MG tablet Take 10 mg by mouth daily.    . carvedilol (COREG) 6.25 MG tablet Take 6.25 mg by mouth 2 (two) times daily with a meal.    . cholecalciferol (VITAMIN D) 1000 UNITS tablet Take 1,000 Units by mouth daily.    Marland Kitchen docusate sodium 100 MG CAPS Take 100 mg by mouth 2 (two) times daily. OTC 10 capsule 0  . ferrous sulfate 325 (65 FE) MG tablet Take 1 tablet (325 mg total) by mouth 3 (three) times daily with meals.    . furosemide (LASIX) 40 MG tablet Take 3 tablets (120 mg total) by mouth 2 (two) times daily. 180 tablet 0  . glimepiride (AMARYL) 4 MG tablet Take 4 mg by mouth 2 (two) times daily.    . isosorbide mononitrate (IMDUR) 30 MG 24 hr tablet Take 30 mg by mouth daily.    Marland Kitchen levothyroxine (SYNTHROID, LEVOTHROID) 175 MCG tablet Take 200 mcg by mouth daily. Takes 2 tablets on Sunday.  5  . metolazone (ZAROXOLYN) 2.5 MG tablet Take 1 tablet (2.5 mg total) by mouth 2 (two) times a week. 30 tablet 6  . mometasone (ELOCON) 0.1 % cream Apply 1 application topically  daily.   0  . nitroGLYCERIN (NITROSTAT) 0.4 MG SL tablet Place 0.4 mg under the tongue every 5 (five) minutes as needed for chest pain.    Marland Kitchen nystatin-triamcinolone (MYCOLOG II) cream Apply 1 application topically 2 (two) times daily.    . pantoprazole (PROTONIX) 40 MG tablet Take 40 mg by mouth 2 (two) times daily.    . potassium chloride SA (K-DUR,KLOR-CON) 20 MEQ tablet Take 2 tablets (40 mEq total) by mouth 2 (two) times daily. 120 tablet 0  . pravastatin (PRAVACHOL) 40 MG tablet Take 40 mg by mouth at bedtime.     . senna (SENOKOT) 8.6 MG TABS Take 2 tablets (17.2 mg total) by mouth daily. otc (Patient taking differently: Take 2 tablets by mouth 2 (two) times daily. otc) 120 each 0  .  silver sulfADIAZINE (SILVADENE) 1 % cream Apply 1 application topically daily.    . sitaGLIPtin (JANUVIA) 50 MG tablet Take 25 mg by mouth daily. Take half a tablet daily.    [name] 300 UNIT/ML SOPN 31 Units at bedtime.     . traMADol (ULTRAM) 50 MG tablet Take 1 tablet (50 mg total) by mouth every 6 (six) hours as needed for pain. 30 tablet 0  . warfarin (COUMADIN) 3 MG tablet TAKE AS DIRECTED 80 tablet 3   No current facility-administered medications for this visit.    Allergies:   No Known Allergies  Social History:  The patient  reports that she has never smoked. She has never used smokeless tobacco. She reports that she does not drink alcohol or use illicit drugs.   Family History  Problem Relation Age of Onset  . Heart failure Neg Hx   . Heart attack Neg Hx   . Sudden death Neg Hx   . Stroke Neg Hx    Currently noncontributory family history.  ROS:  Please see the history of present illness.   No chest pain, no syncope, no bleeding, no orthopnea currently.   All other systems reviewed and negative.   PHYSICAL EXAM: VS:  BP 136/74 mmHg  Pulse 71  Ht 5\' 2"  (1.575 m)  Wt 204 lb (92.534 kg)  BMI 37.30 kg/m2 Well nourished, well developed, in no acute distress HEENT: normal, [place]/AT, EOMI Neck: no JVD, normal carotid upstroke, no bruit Cardiac:  normal S1, S2; irregularly irregular; no murmur Lungs:  clear to auscultation bilaterally, no wheezing, rhonchi or rales Abd: soft, nontender, no hepatomegaly, no bruits Ext: 2-3+ pedal edema L>R, 2+ distal pulses Skin: warm and dry GU: deferred Neuro: no focal abnormalities noted, AAO x 3  EKG:  Atrial fibrillation heart rate 63 with no other abnormalities.  ECHO: 01/23/15: - Left ventricle: The cavity size was normal. Wall thickness was  increased in a pattern of mild LVH. The estimated ejection  fraction was 60%. Wall motion was normal; there were no regional  wall motion abnormalities. - Aortic valve: Sclerosis  without stenosis. There was no  significant regurgitation. - Mitral valve: Calcified annulus. - Left atrium: The atrium was mildly to moderately dilated. - Right ventricle: The cavity size was normal. Systolic function  was normal. - Right atrium: The atrium was mildly dilated.   ASSESSMENT AND PLAN:  Persistent atrial fibrillation -continue with good rate control. No changes made. Doing well. Her heart rate is very steady today. Well controlled.  Chronic anticoagulation -  to monitor Coumadin closely.  Chronic diastolic heart failure -trying to maintain weight. I will increase Metolazone to twice a  week. Fluid, salt restriction. She is on a significant amount of loop diuretic, Lasix 120 mg twice a day. We will watch renal function closely, check basic metabolic profile next week.  Hypertension -under reasonable control. Medications reviewed.   Aortic atherosclerosis -seen on chest x-ray 08/03/15.  Diabetes - trying to avoid hypoglycemia. Blood sugars have been quite elevated recently.  1 month follow-up.  Signed, Candee Furbish, MD [facility]  09/08/2015 3:38 PM

## 2015-09-16 ENCOUNTER — Other Ambulatory Visit (INDEPENDENT_AMBULATORY_CARE_PROVIDER_SITE_OTHER): Payer: Medicare Other | Admitting: *Deleted

## 2015-09-16 DIAGNOSIS — R600 Localized edema: Secondary | ICD-10-CM | POA: Diagnosis not present

## 2015-09-16 DIAGNOSIS — R609 Edema, unspecified: Secondary | ICD-10-CM | POA: Diagnosis not present

## 2015-09-16 DIAGNOSIS — Z79899 Other long term (current) drug therapy: Secondary | ICD-10-CM | POA: Diagnosis not present

## 2015-09-16 LAB — BASIC METABOLIC PANEL
BUN: 38 mg/dL — AB (ref 7–25)
CO2: 30 mmol/L (ref 20–31)
Calcium: 9.3 mg/dL (ref 8.6–10.4)
Chloride: 96 mmol/L — ABNORMAL LOW (ref 98–110)
Creat: 2.02 mg/dL — ABNORMAL HIGH (ref 0.60–0.88)
GLUCOSE: 247 mg/dL — AB (ref 65–99)
POTASSIUM: 3.8 mmol/L (ref 3.5–5.3)
SODIUM: 139 mmol/L (ref 135–146)

## 2015-09-16 NOTE — Addendum Note (Signed)
Addended by: Eulis Foster on: 09/16/2015 10:45 AM   Modules accepted: Orders

## 2015-09-21 ENCOUNTER — Ambulatory Visit (INDEPENDENT_AMBULATORY_CARE_PROVIDER_SITE_OTHER): Payer: Medicare Other | Admitting: Interventional Cardiology

## 2015-09-21 DIAGNOSIS — I4819 Other persistent atrial fibrillation: Secondary | ICD-10-CM

## 2015-09-21 DIAGNOSIS — Z5181 Encounter for therapeutic drug level monitoring: Secondary | ICD-10-CM

## 2015-09-21 DIAGNOSIS — I481 Persistent atrial fibrillation: Secondary | ICD-10-CM

## 2015-09-21 LAB — POCT INR: INR: 3.2

## 2015-09-25 DIAGNOSIS — E890 Postprocedural hypothyroidism: Secondary | ICD-10-CM | POA: Diagnosis not present

## 2015-09-25 DIAGNOSIS — E1122 Type 2 diabetes mellitus with diabetic chronic kidney disease: Secondary | ICD-10-CM | POA: Diagnosis not present

## 2015-10-06 ENCOUNTER — Ambulatory Visit (INDEPENDENT_AMBULATORY_CARE_PROVIDER_SITE_OTHER): Payer: Medicare Other | Admitting: Cardiology

## 2015-10-06 ENCOUNTER — Encounter (HOSPITAL_COMMUNITY)
Admission: RE | Admit: 2015-10-06 | Discharge: 2015-10-06 | Disposition: A | Discharge status: Home / Self Care | Payer: Medicare Other | Source: Ambulatory Visit | Attending: Nephrology | Admitting: Nephrology

## 2015-10-06 ENCOUNTER — Telehealth: Payer: Self-pay | Admitting: Cardiology

## 2015-10-06 DIAGNOSIS — N184 Chronic kidney disease, stage 4 (severe): Secondary | ICD-10-CM

## 2015-10-06 DIAGNOSIS — I4891 Unspecified atrial fibrillation: Secondary | ICD-10-CM | POA: Diagnosis not present

## 2015-10-06 DIAGNOSIS — I4819 Other persistent atrial fibrillation: Secondary | ICD-10-CM

## 2015-10-06 DIAGNOSIS — Z7901 Long term (current) use of anticoagulants: Secondary | ICD-10-CM | POA: Diagnosis not present

## 2015-10-06 DIAGNOSIS — Z5181 Encounter for therapeutic drug level monitoring: Secondary | ICD-10-CM

## 2015-10-06 DIAGNOSIS — I481 Persistent atrial fibrillation: Secondary | ICD-10-CM

## 2015-10-06 LAB — IRON AND TIBC
IRON: 45 ug/dL (ref 28–170)
Saturation Ratios: 20 % (ref 10.4–31.8)
TIBC: 220 ug/dL — ABNORMAL LOW (ref 250–450)
UIBC: 175 ug/dL

## 2015-10-06 LAB — FERRITIN: Ferritin: 552 ng/mL — ABNORMAL HIGH (ref 11–307)

## 2015-10-06 LAB — POCT INR: INR: 2.7

## 2015-10-06 LAB — POCT HEMOGLOBIN-HEMACUE: HEMOGLOBIN: 10.5 g/dL — AB (ref 12.0–15.0)

## 2015-10-06 MED ORDER — EPOETIN ALFA 10000 UNIT/ML IJ SOLN
INTRAMUSCULAR | Status: AC
  Filled 2015-10-06: qty 1

## 2015-10-06 MED ORDER — EPOETIN ALFA 10000 UNIT/ML IJ SOLN
10000.0000 [IU] | INTRAMUSCULAR | Status: DC
  Administered 2015-10-06: 10000 [IU] via SUBCUTANEOUS

## 2015-10-06 NOTE — Telephone Encounter (Signed)
Spoke with pt's daughter who is reporting her PCP wants pt to increase Losartan to 25 mg a day.  Her BP at her PCP's office was 155/40 per her report.  Pt has been taking 12.5 mg a day since 08/2015.  Daughter is very concerned and wants Dr Marlou Porch opinion before increasing it.  Advised I would be best to discuss with Dr Marlou Porch at pt's appt on Thursday.  Daughter is in agreement and will talk to him then.

## 2015-10-06 NOTE — Telephone Encounter (Signed)
New message      Pt c/o medication issue:  1. Name of Medication: Artan  2. How are you currently taking this medication (dosage and times per day)? 12.5 mg po daily now the primary care want to increase 25 mg po daily  3. Are you having a reaction (difficulty breathing--STAT)? no  4. What is your medication issue? The daughter needs to speak about the increase.

## 2015-10-08 ENCOUNTER — Ambulatory Visit (INDEPENDENT_AMBULATORY_CARE_PROVIDER_SITE_OTHER): Payer: Medicare Other | Admitting: Cardiology

## 2015-10-08 ENCOUNTER — Encounter: Payer: Self-pay | Admitting: Cardiology

## 2015-10-08 VITALS — BP 130/72 | HR 62 | Ht 62.0 in | Wt 199.6 lb

## 2015-10-08 DIAGNOSIS — Z79899 Other long term (current) drug therapy: Secondary | ICD-10-CM | POA: Diagnosis not present

## 2015-10-08 DIAGNOSIS — I481 Persistent atrial fibrillation: Secondary | ICD-10-CM

## 2015-10-08 DIAGNOSIS — I10 Essential (primary) hypertension: Secondary | ICD-10-CM

## 2015-10-08 DIAGNOSIS — R600 Localized edema: Secondary | ICD-10-CM

## 2015-10-08 DIAGNOSIS — I4819 Other persistent atrial fibrillation: Secondary | ICD-10-CM

## 2015-10-08 LAB — BASIC METABOLIC PANEL
BUN: 40 mg/dL — ABNORMAL HIGH (ref 7–25)
CHLORIDE: 96 mmol/L — AB (ref 98–110)
CO2: 30 mmol/L (ref 20–31)
Calcium: 9.8 mg/dL (ref 8.6–10.4)
Creat: 2.07 mg/dL — ABNORMAL HIGH (ref 0.60–0.88)
GLUCOSE: 236 mg/dL — AB (ref 65–99)
Potassium: 3.9 mmol/L (ref 3.5–5.3)
SODIUM: 138 mmol/L (ref 135–146)

## 2015-10-08 NOTE — Patient Instructions (Signed)
Medication Instructions:  The current medical regimen is effective;  continue present plan and medications.  Labwork: Please have blood work today (BMP).  Follow-Up: Follow up in 3 months with Dr Marlou Porch.  If you need a refill on your cardiac medications before your next appointment, please call your pharmacy.  Thank you for choosing [facility]!!

## 2015-10-08 NOTE — Progress Notes (Signed)
[facility]. [address], [address] [place], [place]  [address] Phone: [phone] Fax:  [phone]  Date:  10/08/2015   ID:  Madeline [name], DOB 1925-05-24, MRN [id number]  PCP:  Corine Shelter, PA-C   History of Present Illness: Madeline [name] is a 82 y.o. female with chronic diastolic heart failure on occasional metolazone with chronic atrial fibrillation rate controlled, chronic kidney disease stage 3- IV, creatinine 1.98.   Echocardiogram 01/2015 showed normal ejection fraction. She also had an episode of hypoglycemia which resulted in diaphoresis and increased heart rate.  She has been battling with fluid retention. We've been quite aggressive with her diuretic regimen.  On 09/08/15, I increased the metolazone to twice a week as she is taking Lasix 120 mg twice a day. Dr. Justin Mend had been monitoring her renal function on a yearly basis.   No bleeding, no syncope. No recent falls. Her husband died at 53 had dementia. Her daughters help out significantly.    Wt Readings from Last 3 Encounters:  10/08/15 199 lb 9.6 oz (90.538 kg)  09/08/15 204 lb (92.534 kg)  08/03/15 197 lb (89.359 kg)     Past Medical History  Diagnosis Date  . Hypertension   . Diabetes mellitus   . CHF (congestive heart failure) ([place])   . Thyroid disease   . Atrial fibrillation ([place])   . Kidney failure   . Anemia 11/07/2012    Past Surgical History  Procedure Laterality Date  . Abdominal hysterectomy    . Thyroid surgery      Current Outpatient Prescriptions  Medication Sig Dispense Refill  . acetaminophen (TYLENOL) 500 MG tablet Take 500 mg by mouth every 6 (six) hours as needed for mild pain, moderate pain, fever or headache.    Marland Kitchen amLODipine (NORVASC) 10 MG tablet Take 10 mg by mouth daily.    . carvedilol (COREG) 6.25 MG tablet Take 6.25 mg by mouth 2 (two) times daily with a meal.    . cholecalciferol (VITAMIN D) 1000 UNITS tablet Take 1,000 Units by mouth daily.    Marland Kitchen docusate sodium 100 MG CAPS Take  100 mg by mouth 2 (two) times daily. OTC 10 capsule 0  . ferrous sulfate 325 (65 FE) MG tablet Take 1 tablet (325 mg total) by mouth 3 (three) times daily with meals.    . furosemide (LASIX) 40 MG tablet Take 3 tablets (120 mg total) by mouth 2 (two) times daily. 180 tablet 0  . glimepiride (AMARYL) 4 MG tablet Take 4 mg by mouth 2 (two) times daily.    . isosorbide mononitrate (IMDUR) 30 MG 24 hr tablet Take 30 mg by mouth daily.    Marland Kitchen levothyroxine (SYNTHROID, LEVOTHROID) 175 MCG tablet Take 200 mcg by mouth daily. Takes 2 tablets on Sunday.  5  . losartan (COZAAR) 25 MG tablet Take 12.5 mg by mouth daily.    . metolazone (ZAROXOLYN) 2.5 MG tablet Take 1 tablet (2.5 mg total) by mouth 2 (two) times a week. 30 tablet 6  . mometasone (ELOCON) 0.1 % cream Apply 1 application topically daily.   0  . nitroGLYCERIN (NITROSTAT) 0.4 MG SL tablet Place 0.4 mg under the tongue every 5 (five) minutes as needed for chest pain.    Marland Kitchen nystatin-triamcinolone (MYCOLOG II) cream Apply 1 application topically 2 (two) times daily.    . pantoprazole (PROTONIX) 40 MG tablet Take 40 mg by mouth 2 (two) times daily.    . potassium chloride SA (K-DUR,KLOR-CON)  20 MEQ tablet Take 2 tablets (40 mEq total) by mouth 2 (two) times daily. 120 tablet 0  . pravastatin (PRAVACHOL) 40 MG tablet Take 40 mg by mouth at bedtime.     . predniSONE (DELTASONE) 10 MG tablet Take 10 mg by mouth daily as needed (gout flare up).    . senna (SENOKOT) 8.6 MG TABS Take 2 tablets (17.2 mg total) by mouth daily. otc (Patient taking differently: Take 2 tablets by mouth 2 (two) times daily. otc) 120 each 0  . silver sulfADIAZINE (SILVADENE) 1 % cream Apply 1 application topically daily.    . sitaGLIPtin (JANUVIA) 50 MG tablet Take 25 mg by mouth daily. Take half a tablet daily.    [name] 300 UNIT/ML SOPN 31 Units at bedtime.     . traMADol (ULTRAM) 50 MG tablet Take 1 tablet (50 mg total) by mouth every 6 (six) hours as needed for pain.  30 tablet 0  . warfarin (COUMADIN) 3 MG tablet TAKE AS DIRECTED 80 tablet 3   No current facility-administered medications for this visit.    Allergies:   No Known Allergies  Social History:  The patient  reports that she has never smoked. She has never used smokeless tobacco. She reports that she does not drink alcohol or use illicit drugs.   Family History  Problem Relation Age of Onset  . Heart failure Neg Hx   . Heart attack Neg Hx   . Sudden death Neg Hx   . Stroke Neg Hx    Currently noncontributory family history.  ROS:  Please see the history of present illness.   No chest pain, no syncope, no bleeding, no orthopnea currently.   All other systems reviewed and negative.   PHYSICAL EXAM: VS:  BP 130/72 mmHg  Pulse 62  Ht 5\' 2"  (1.575 m)  Wt 199 lb 9.6 oz (90.538 kg)  BMI 36.50 kg/m2 Well nourished, well developed, in no acute distress HEENT: normal, [place]/AT, EOMI Neck: no JVD, normal carotid upstroke, no bruit Cardiac:  normal S1, S2; irregularly irregular; no murmur Lungs:  clear to auscultation bilaterally, no wheezing, rhonchi or rales Abd: soft, nontender, no hepatomegaly, no bruits Ext: 1+ pedal edema L>R now with compression hose on, 2+ distal pulses Skin: warm and dry GU: deferred Neuro: no focal abnormalities noted, AAO x 3  EKG:  Atrial fibrillation heart rate 63 with no other abnormalities.  ECHO: 01/23/15: - Left ventricle: The cavity size was normal. Wall thickness was  increased in a pattern of mild LVH. The estimated ejection  fraction was 60%. Wall motion was normal; there were no regional  wall motion abnormalities. - Aortic valve: Sclerosis without stenosis. There was no  significant regurgitation. - Mitral valve: Calcified annulus. - Left atrium: The atrium was mildly to moderately dilated. - Right ventricle: The cavity size was normal. Systolic function  was normal. - Right atrium: The atrium was mildly dilated.   ASSESSMENT AND  PLAN:  Persistent atrial fibrillation -continue with good rate control. No changes made. Doing well. Her heart rate is very steady today. Well controlled.  Chronic anticoagulation -  to monitor Coumadin closely.  Chronic diastolic heart failure -trying to maintain weight. I will increase Metolazone to twice a week. Fluid, salt restriction. She is on a significant amount of loop diuretic, Lasix 120 mg twice a day. We will watch renal function closely, check basic metabolic profile again.  Hypertension -under reasonable control. Medications reviewed. Losartan low-dose. I will be  fine with her increasing it slightly. Continue to monitor basic medical profile.  Aortic atherosclerosis -seen on chest x-ray 08/03/15. Continue secondary prevention.  Diabetes - trying to avoid hypoglycemia. Blood sugars have been quite elevated recently.  Chronic kidney disease stage III -Creatinine has been ranging from 1.8 - 2.0. We are monitoring closely with addition of metolazone. Proteinuria noted.  1 month follow-up.  Signed, Candee Furbish, MD [facility]  10/08/2015 10:41 AM

## 2015-10-19 ENCOUNTER — Ambulatory Visit (INDEPENDENT_AMBULATORY_CARE_PROVIDER_SITE_OTHER): Payer: Medicare Other | Admitting: Cardiology

## 2015-10-19 DIAGNOSIS — I4819 Other persistent atrial fibrillation: Secondary | ICD-10-CM

## 2015-10-19 DIAGNOSIS — Z5181 Encounter for therapeutic drug level monitoring: Secondary | ICD-10-CM

## 2015-10-19 DIAGNOSIS — I481 Persistent atrial fibrillation: Secondary | ICD-10-CM

## 2015-10-19 LAB — PROTIME-INR: INR: 2.9 — AB (ref 0.9–1.1)

## 2015-10-26 ENCOUNTER — Other Ambulatory Visit: Payer: Self-pay | Admitting: Cardiology

## 2015-10-30 DIAGNOSIS — N184 Chronic kidney disease, stage 4 (severe): Secondary | ICD-10-CM | POA: Diagnosis not present

## 2015-10-30 DIAGNOSIS — E890 Postprocedural hypothyroidism: Secondary | ICD-10-CM | POA: Diagnosis not present

## 2015-11-02 ENCOUNTER — Encounter (HOSPITAL_COMMUNITY)
Admission: RE | Admit: 2015-11-02 | Discharge: 2015-11-02 | Disposition: A | Discharge status: Home / Self Care | Payer: Medicare Other | Source: Ambulatory Visit | Attending: Nephrology | Admitting: Nephrology

## 2015-11-02 DIAGNOSIS — N184 Chronic kidney disease, stage 4 (severe): Secondary | ICD-10-CM | POA: Diagnosis not present

## 2015-11-02 LAB — POCT HEMOGLOBIN-HEMACUE: HEMOGLOBIN: 10.5 g/dL — AB (ref 12.0–15.0)

## 2015-11-02 LAB — IRON AND TIBC
Iron: 49 ug/dL (ref 28–170)
Saturation Ratios: 22 % (ref 10.4–31.8)
TIBC: 221 ug/dL — ABNORMAL LOW (ref 250–450)
UIBC: 172 ug/dL

## 2015-11-02 LAB — FERRITIN: Ferritin: 666 ng/mL — ABNORMAL HIGH (ref 11–307)

## 2015-11-02 MED ORDER — EPOETIN ALFA 10000 UNIT/ML IJ SOLN
10000.0000 [IU] | INTRAMUSCULAR | Status: DC
  Administered 2015-11-02: 10000 [IU] via SUBCUTANEOUS

## 2015-11-02 MED ORDER — EPOETIN ALFA 10000 UNIT/ML IJ SOLN
INTRAMUSCULAR | Status: AC
  Filled 2015-11-02: qty 1

## 2015-11-11 ENCOUNTER — Telehealth: Payer: Self-pay | Admitting: Cardiology

## 2015-11-12 ENCOUNTER — Ambulatory Visit (INDEPENDENT_AMBULATORY_CARE_PROVIDER_SITE_OTHER): Payer: Medicare Other | Admitting: Cardiology

## 2015-11-12 DIAGNOSIS — I4819 Other persistent atrial fibrillation: Secondary | ICD-10-CM

## 2015-11-12 DIAGNOSIS — Z5181 Encounter for therapeutic drug level monitoring: Secondary | ICD-10-CM

## 2015-11-12 DIAGNOSIS — I481 Persistent atrial fibrillation: Secondary | ICD-10-CM

## 2015-11-12 LAB — POCT INR: INR: 2.3

## 2015-11-19 NOTE — Telephone Encounter (Signed)
Closed encounter °

## 2015-11-30 ENCOUNTER — Encounter (HOSPITAL_COMMUNITY)
Admission: RE | Admit: 2015-11-30 | Discharge: 2015-11-30 | Disposition: A | Discharge status: Home / Self Care | Payer: Medicare Other | Source: Ambulatory Visit | Attending: Nephrology | Admitting: Nephrology

## 2015-11-30 DIAGNOSIS — N184 Chronic kidney disease, stage 4 (severe): Secondary | ICD-10-CM | POA: Diagnosis not present

## 2015-11-30 LAB — IRON AND TIBC
Iron: 47 ug/dL (ref 28–170)
Saturation Ratios: 21 % (ref 10.4–31.8)
TIBC: 223 ug/dL — AB (ref 250–450)
UIBC: 176 ug/dL

## 2015-11-30 LAB — FERRITIN: FERRITIN: 640 ng/mL — AB (ref 11–307)

## 2015-11-30 LAB — POCT HEMOGLOBIN-HEMACUE: HEMOGLOBIN: 11 g/dL — AB (ref 12.0–15.0)

## 2015-11-30 MED ORDER — EPOETIN ALFA 10000 UNIT/ML IJ SOLN
10000.0000 [IU] | INTRAMUSCULAR | Status: DC
  Administered 2015-11-30: 10000 [IU] via SUBCUTANEOUS

## 2015-11-30 MED ORDER — EPOETIN ALFA 10000 UNIT/ML IJ SOLN
INTRAMUSCULAR | Status: AC
  Filled 2015-11-30: qty 1

## 2015-12-01 ENCOUNTER — Ambulatory Visit (INDEPENDENT_AMBULATORY_CARE_PROVIDER_SITE_OTHER): Payer: Medicare Other | Admitting: Interventional Cardiology

## 2015-12-01 DIAGNOSIS — Z5181 Encounter for therapeutic drug level monitoring: Secondary | ICD-10-CM

## 2015-12-01 DIAGNOSIS — I4819 Other persistent atrial fibrillation: Secondary | ICD-10-CM

## 2015-12-01 DIAGNOSIS — I481 Persistent atrial fibrillation: Secondary | ICD-10-CM

## 2015-12-01 LAB — POCT INR: INR: 3.2

## 2015-12-15 ENCOUNTER — Ambulatory Visit (INDEPENDENT_AMBULATORY_CARE_PROVIDER_SITE_OTHER): Payer: Medicare Other | Admitting: Interventional Cardiology

## 2015-12-15 DIAGNOSIS — Z5181 Encounter for therapeutic drug level monitoring: Secondary | ICD-10-CM

## 2015-12-15 DIAGNOSIS — I481 Persistent atrial fibrillation: Secondary | ICD-10-CM

## 2015-12-15 DIAGNOSIS — Z7901 Long term (current) use of anticoagulants: Secondary | ICD-10-CM | POA: Diagnosis not present

## 2015-12-15 DIAGNOSIS — I4819 Other persistent atrial fibrillation: Secondary | ICD-10-CM

## 2015-12-15 DIAGNOSIS — I4891 Unspecified atrial fibrillation: Secondary | ICD-10-CM | POA: Diagnosis not present

## 2015-12-15 LAB — POCT INR: INR: 2.4

## 2015-12-28 ENCOUNTER — Encounter (HOSPITAL_COMMUNITY): Payer: Medicare Other

## 2015-12-29 ENCOUNTER — Ambulatory Visit (INDEPENDENT_AMBULATORY_CARE_PROVIDER_SITE_OTHER): Payer: Medicare Other | Admitting: Interventional Cardiology

## 2015-12-29 DIAGNOSIS — I481 Persistent atrial fibrillation: Secondary | ICD-10-CM

## 2015-12-29 DIAGNOSIS — Z5181 Encounter for therapeutic drug level monitoring: Secondary | ICD-10-CM

## 2015-12-29 DIAGNOSIS — I4819 Other persistent atrial fibrillation: Secondary | ICD-10-CM

## 2015-12-29 LAB — POCT INR: INR: 2.6

## 2016-01-04 ENCOUNTER — Encounter (HOSPITAL_COMMUNITY)
Admission: RE | Admit: 2016-01-04 | Discharge: 2016-01-04 | Disposition: A | Discharge status: Home / Self Care | Payer: Medicare Other | Source: Ambulatory Visit | Attending: Nephrology | Admitting: Nephrology

## 2016-01-04 DIAGNOSIS — N184 Chronic kidney disease, stage 4 (severe): Secondary | ICD-10-CM | POA: Diagnosis not present

## 2016-01-04 LAB — IRON AND TIBC
IRON: 59 ug/dL (ref 28–170)
Saturation Ratios: 25 % (ref 10.4–31.8)
TIBC: 234 ug/dL — AB (ref 250–450)
UIBC: 175 ug/dL

## 2016-01-04 LAB — POCT HEMOGLOBIN-HEMACUE: HEMOGLOBIN: 10.6 g/dL — AB (ref 12.0–15.0)

## 2016-01-04 LAB — FERRITIN: Ferritin: 649 ng/mL — ABNORMAL HIGH (ref 11–307)

## 2016-01-04 MED ORDER — EPOETIN ALFA 10000 UNIT/ML IJ SOLN
INTRAMUSCULAR | Status: AC
  Administered 2016-01-04: 10000 [IU] via SUBCUTANEOUS
  Filled 2016-01-04: qty 1

## 2016-01-04 MED ORDER — EPOETIN ALFA 10000 UNIT/ML IJ SOLN: 10000.0000 [IU] | INTRAMUSCULAR | Status: DC

## 2016-01-05 ENCOUNTER — Encounter: Payer: Self-pay | Admitting: Cardiology

## 2016-01-12 ENCOUNTER — Ambulatory Visit (INDEPENDENT_AMBULATORY_CARE_PROVIDER_SITE_OTHER): Payer: Medicare Other | Admitting: Cardiology

## 2016-01-12 DIAGNOSIS — Z5181 Encounter for therapeutic drug level monitoring: Secondary | ICD-10-CM

## 2016-01-12 DIAGNOSIS — I4819 Other persistent atrial fibrillation: Secondary | ICD-10-CM

## 2016-01-12 DIAGNOSIS — I481 Persistent atrial fibrillation: Secondary | ICD-10-CM

## 2016-01-12 LAB — POCT INR: INR: 3.3

## 2016-01-19 ENCOUNTER — Ambulatory Visit: Payer: Medicare Other | Admitting: Cardiology

## 2016-01-21 ENCOUNTER — Ambulatory Visit (INDEPENDENT_AMBULATORY_CARE_PROVIDER_SITE_OTHER): Payer: Medicare Other | Admitting: Cardiology

## 2016-01-21 ENCOUNTER — Encounter (INDEPENDENT_AMBULATORY_CARE_PROVIDER_SITE_OTHER): Payer: Self-pay

## 2016-01-21 ENCOUNTER — Encounter: Payer: Self-pay | Admitting: Cardiology

## 2016-01-21 VITALS — BP 126/74 | HR 72 | Ht 62.0 in | Wt 201.4 lb

## 2016-01-21 DIAGNOSIS — Z79899 Other long term (current) drug therapy: Secondary | ICD-10-CM | POA: Diagnosis not present

## 2016-01-21 DIAGNOSIS — I5032 Chronic diastolic (congestive) heart failure: Secondary | ICD-10-CM | POA: Diagnosis not present

## 2016-01-21 DIAGNOSIS — I10 Essential (primary) hypertension: Secondary | ICD-10-CM | POA: Diagnosis not present

## 2016-01-21 DIAGNOSIS — Z7901 Long term (current) use of anticoagulants: Secondary | ICD-10-CM | POA: Diagnosis not present

## 2016-01-21 DIAGNOSIS — I482 Chronic atrial fibrillation, unspecified: Secondary | ICD-10-CM

## 2016-01-21 LAB — BASIC METABOLIC PANEL
BUN: 39 mg/dL — AB (ref 7–25)
CO2: 29 mmol/L (ref 20–31)
Calcium: 9.3 mg/dL (ref 8.6–10.4)
Chloride: 99 mmol/L (ref 98–110)
Creat: 2.11 mg/dL — ABNORMAL HIGH (ref 0.60–0.88)
Glucose, Bld: 262 mg/dL — ABNORMAL HIGH (ref 65–99)
POTASSIUM: 4.5 mmol/L (ref 3.5–5.3)
SODIUM: 139 mmol/L (ref 135–146)

## 2016-01-21 NOTE — Progress Notes (Signed)
[facility]. [address], [address] [place], [place]  [address] Phone: [phone] Fax:  [phone]  Date:  01/21/2016   ID:  Madeline [name], DOB 1925/09/10, MRN [id number]  PCP:  Corine Shelter, PA-C   History of Present Illness: Madeline [name] is a 82 y.o. female with chronic diastolic heart failure on occasional metolazone with chronic atrial fibrillation rate controlled, chronic kidney disease stage 3- IV, creatinine 1.98.   Echocardiogram 01/2015 showed normal ejection fraction. She also had an episode of hypoglycemia which resulted in diaphoresis and increased heart rate.  She had been battling with fluid retention. We've been quite aggressive with her diuretic regimen. Has been quite stable recently. No syncope and short of breath. Her main complaint is knee pain. She is a walker for ambulation.  On 09/08/15, I increased the metolazone to twice a week as she is taking Lasix 120 mg twice a day. Dr. Justin Mend had been monitoring her renal function on a yearly basis.   No bleeding, no syncope. No recent falls. Her husband died at 61 had dementia. Her daughters help out significantly.   She has been doing well. Her daughter is been pleased. Knee pain biggest issue.   Wt Readings from Last 3 Encounters:  01/21/16 201 lb 6.4 oz (91.4 kg)  10/08/15 199 lb 9.6 oz (90.5 kg)  09/08/15 204 lb (92.5 kg)     Past Medical History:  Diagnosis Date  . Anemia 11/07/2012  . Atrial fibrillation ([place])   . CHF (congestive heart failure) ([place])   . Diabetes mellitus   . Hypertension   . Kidney failure   . Thyroid disease     Past Surgical History:  Procedure Laterality Date  . ABDOMINAL HYSTERECTOMY    . THYROID SURGERY      Current Outpatient Prescriptions  Medication Sig Dispense Refill  . acetaminophen (TYLENOL) 500 MG tablet Take 500 mg by mouth every 6 (six) hours as needed for mild pain, moderate pain, fever or headache.    Marland Kitchen amLODipine (NORVASC) 10 MG tablet Take 10 mg by mouth daily.      . carvedilol (COREG) 6.25 MG tablet Take 6.25 mg by mouth 2 (two) times daily with a meal.    . cholecalciferol (VITAMIN D) 1000 UNITS tablet Take 1,000 Units by mouth daily.    Marland Kitchen docusate sodium 100 MG CAPS Take 100 mg by mouth 2 (two) times daily. OTC 10 capsule 0  . ferrous sulfate 325 (65 FE) MG tablet Take 1 tablet (325 mg total) by mouth 3 (three) times daily with meals.    . furosemide (LASIX) 40 MG tablet Take 3 tablets (120 mg total) by mouth 2 (two) times daily. 180 tablet 0  . glimepiride (AMARYL) 4 MG tablet Take 4 mg by mouth 2 (two) times daily.    . isosorbide mononitrate (IMDUR) 30 MG 24 hr tablet Take 30 mg by mouth daily.    Marland Kitchen levothyroxine (SYNTHROID, LEVOTHROID) 175 MCG tablet Patient takes 1 tablet a day by mouth daily excepet on Sundays she takes 1 1/2 by mouth.  5  . losartan (COZAAR) 25 MG tablet Take 25 mg by mouth daily.     . metolazone (ZAROXOLYN) 2.5 MG tablet Take 1 tablet (2.5 mg total) by mouth 2 (two) times a week. 30 tablet 6  . mometasone (ELOCON) 0.1 % cream Apply 1 application topically daily.   0  . nitroGLYCERIN (NITROSTAT) 0.4 MG SL tablet Place 0.4 mg under the tongue every 5 (  five) minutes as needed for chest pain.    Marland Kitchen nystatin-triamcinolone (MYCOLOG II) cream Apply 1 application topically 2 (two) times daily.    . pantoprazole (PROTONIX) 40 MG tablet Take 40 mg by mouth 2 (two) times daily.    . potassium chloride SA (K-DUR,KLOR-CON) 20 MEQ tablet Take 2 tablets (40 mEq total) by mouth 2 (two) times daily. 120 tablet 0  . pravastatin (PRAVACHOL) 40 MG tablet Take 40 mg by mouth at bedtime.     . predniSONE (DELTASONE) 10 MG tablet Take 10 mg by mouth daily as needed (gout flare up).    . senna (SENOKOT) 8.6 MG TABS tablet Take 2 tablets by mouth daily.    . silver sulfADIAZINE (SILVADENE) 1 % cream Apply 1 application topically daily.    . sitaGLIPtin (JANUVIA) 50 MG tablet Take 25 mg by mouth daily. Take half a tablet daily.    [name] 300  UNIT/ML SOPN 31 Units at bedtime.     . traMADol (ULTRAM) 50 MG tablet Take 1 tablet (50 mg total) by mouth every 6 (six) hours as needed for pain. 30 tablet 0  . warfarin (COUMADIN) 3 MG tablet TAKE AS DIRECTED 80 tablet 3   No current facility-administered medications for this visit.     Allergies:   No Known Allergies  Social History:  The patient  reports that she has never smoked. She has never used smokeless tobacco. She reports that she does not drink alcohol or use drugs.   Family History  Problem Relation Age of Onset  . Heart failure Neg Hx   . Heart attack Neg Hx   . Sudden death Neg Hx   . Stroke Neg Hx    Currently noncontributory family history.  ROS:  Please see the history of present illness.   No chest pain, no syncope, no bleeding, no orthopnea currently.   All other systems reviewed and negative.   PHYSICAL EXAM: VS:  BP 126/74   Pulse 72   Ht 5\' 2"  (1.575 m)   Wt 201 lb 6.4 oz (91.4 kg)   BMI 36.84 kg/m  Well nourished, well developed, in no acute distress HEENT: normal, [place]/AT, EOMI Neck: no JVD, normal carotid upstroke, no bruit Cardiac:  normal S1, S2; irregularly irregular; no murmur Lungs:  clear to auscultation bilaterally, no wheezing, rhonchi or rales Abd: soft, nontender, no hepatomegaly, no bruits Ext: 1+ pedal edema L>R now with compression hose on, 2+ distal pulses Skin: warm and dry GU: deferred Neuro: no focal abnormalities noted, AAO x 3  EKG:  Atrial fibrillation heart rate 63 with no other abnormalities.  ECHO: 01/23/15: - Left ventricle: The cavity size was normal. Wall thickness was  increased in a pattern of mild LVH. The estimated ejection  fraction was 60%. Wall motion was normal; there were no regional  wall motion abnormalities. - Aortic valve: Sclerosis without stenosis. There was no  significant regurgitation. - Mitral valve: Calcified annulus. - Left atrium: The atrium was mildly to moderately dilated. - Right  ventricle: The cavity size was normal. Systolic function  was normal. - Right atrium: The atrium was mildly dilated.   ASSESSMENT AND PLAN:  Chronic atrial fibrillation -continue with good rate control. No changes made. Doing well. Her heart rate is very steady today. Well controlled.  Chronic anticoagulation -  to monitor Coumadin closely.  Chronic diastolic heart failure -trying to maintain weight. I will increase Metolazone to twice a week. Fluid, salt restriction. She is  on a significant amount of loop diuretic, Lasix 120 mg twice a day. We will watch renal function closely, check basic metabolic profile again.  Hypertension -under reasonable control. Medications reviewed. Losartan low-dose. I will be fine with her increasing it slightly. Continue to monitor basic medical profile. Amlodipine may be contribute into her lower 70 edema however it seems that she requires this for adequate blood pressure control.  Aortic atherosclerosis -seen on chest x-ray 08/03/15. Continue secondary prevention.  Diabetes - trying to avoid hypoglycemia. Blood sugars have been quite elevated recently.  Chronic kidney disease stage III -Creatinine has been ranging from 1.8 - 2.0. We are monitoring closely with addition of metolazone. Proteinuria noted. Checking basic metabolic profile.  4 month follow-up.  Signed, Candee Furbish, MD [facility]  01/21/2016 10:07 AM

## 2016-01-21 NOTE — Patient Instructions (Signed)
Medication Instructions:  The current medical regimen is effective;  continue present plan and medications.  Labwork: Please have blood work today (BMP)  Follow-Up: Follow up in 6 months with Dr. Marlou Porch.  You will receive a letter in the mail 2 months before you are due.  Please call us when you receive this letter to schedule your follow up appointment.  If you need a refill on your cardiac medications before your next appointment, please call your pharmacy.  Thank you for choosing [facility]!!

## 2016-01-26 ENCOUNTER — Ambulatory Visit (INDEPENDENT_AMBULATORY_CARE_PROVIDER_SITE_OTHER): Payer: Medicare Other | Admitting: Cardiology

## 2016-01-26 DIAGNOSIS — I482 Chronic atrial fibrillation, unspecified: Secondary | ICD-10-CM

## 2016-01-26 DIAGNOSIS — Z5181 Encounter for therapeutic drug level monitoring: Secondary | ICD-10-CM

## 2016-01-26 LAB — POCT INR: INR: 3.1

## 2016-02-01 ENCOUNTER — Encounter (HOSPITAL_COMMUNITY)
Admission: RE | Admit: 2016-02-01 | Discharge: 2016-02-01 | Disposition: A | Discharge status: Home / Self Care | Payer: Medicare Other | Source: Ambulatory Visit | Attending: Nephrology | Admitting: Nephrology

## 2016-02-01 DIAGNOSIS — N184 Chronic kidney disease, stage 4 (severe): Secondary | ICD-10-CM | POA: Diagnosis not present

## 2016-02-01 LAB — IRON AND TIBC
Iron: 41 ug/dL (ref 28–170)
SATURATION RATIOS: 19 % (ref 10.4–31.8)
TIBC: 218 ug/dL — ABNORMAL LOW (ref 250–450)
UIBC: 177 ug/dL

## 2016-02-01 LAB — POCT HEMOGLOBIN-HEMACUE: Hemoglobin: 10.5 g/dL — ABNORMAL LOW (ref 12.0–15.0)

## 2016-02-01 LAB — FERRITIN: Ferritin: 578 ng/mL — ABNORMAL HIGH (ref 11–307)

## 2016-02-01 MED ORDER — EPOETIN ALFA 10000 UNIT/ML IJ SOLN
10000.0000 [IU] | INTRAMUSCULAR | Status: DC
  Administered 2016-02-01: 10000 [IU] via SUBCUTANEOUS

## 2016-02-01 MED ORDER — EPOETIN ALFA 10000 UNIT/ML IJ SOLN
INTRAMUSCULAR | Status: AC
  Filled 2016-02-01: qty 1

## 2016-02-11 ENCOUNTER — Ambulatory Visit (INDEPENDENT_AMBULATORY_CARE_PROVIDER_SITE_OTHER): Payer: Medicare Other | Admitting: Internal Medicine

## 2016-02-11 DIAGNOSIS — I482 Chronic atrial fibrillation, unspecified: Secondary | ICD-10-CM

## 2016-02-11 DIAGNOSIS — Z5181 Encounter for therapeutic drug level monitoring: Secondary | ICD-10-CM

## 2016-02-11 DIAGNOSIS — Z7901 Long term (current) use of anticoagulants: Secondary | ICD-10-CM | POA: Diagnosis not present

## 2016-02-11 LAB — POCT INR: INR: 2.7

## 2016-02-15 DIAGNOSIS — J069 Acute upper respiratory infection, unspecified: Secondary | ICD-10-CM | POA: Diagnosis not present

## 2016-02-15 DIAGNOSIS — E1122 Type 2 diabetes mellitus with diabetic chronic kidney disease: Secondary | ICD-10-CM | POA: Diagnosis not present

## 2016-02-15 DIAGNOSIS — E890 Postprocedural hypothyroidism: Secondary | ICD-10-CM | POA: Diagnosis not present

## 2016-02-15 DIAGNOSIS — Z23 Encounter for immunization: Secondary | ICD-10-CM | POA: Diagnosis not present

## 2016-02-15 DIAGNOSIS — I5032 Chronic diastolic (congestive) heart failure: Secondary | ICD-10-CM | POA: Diagnosis not present

## 2016-02-26 ENCOUNTER — Ambulatory Visit (INDEPENDENT_AMBULATORY_CARE_PROVIDER_SITE_OTHER): Payer: Medicare Other

## 2016-02-26 DIAGNOSIS — I482 Chronic atrial fibrillation, unspecified: Secondary | ICD-10-CM

## 2016-02-26 DIAGNOSIS — Z5181 Encounter for therapeutic drug level monitoring: Secondary | ICD-10-CM

## 2016-02-26 LAB — POCT INR: INR: 3.4

## 2016-02-29 ENCOUNTER — Encounter (HOSPITAL_COMMUNITY)
Admission: RE | Admit: 2016-02-29 | Discharge: 2016-02-29 | Disposition: A | Discharge status: Home / Self Care | Payer: Medicare Other | Source: Ambulatory Visit | Attending: Nephrology | Admitting: Nephrology

## 2016-02-29 DIAGNOSIS — N184 Chronic kidney disease, stage 4 (severe): Secondary | ICD-10-CM | POA: Insufficient documentation

## 2016-02-29 LAB — IRON AND TIBC
IRON: 46 ug/dL (ref 28–170)
SATURATION RATIOS: 21 % (ref 10.4–31.8)
TIBC: 223 ug/dL — ABNORMAL LOW (ref 250–450)
UIBC: 177 ug/dL

## 2016-02-29 LAB — FERRITIN: FERRITIN: 1085 ng/mL — AB (ref 11–307)

## 2016-02-29 LAB — POCT HEMOGLOBIN-HEMACUE: Hemoglobin: 11.2 g/dL — ABNORMAL LOW (ref 12.0–15.0)

## 2016-02-29 MED ORDER — EPOETIN ALFA 10000 UNIT/ML IJ SOLN
INTRAMUSCULAR | Status: AC
  Filled 2016-02-29: qty 1

## 2016-02-29 MED ORDER — EPOETIN ALFA 10000 UNIT/ML IJ SOLN
10000.0000 [IU] | INTRAMUSCULAR | Status: DC
  Administered 2016-02-29: 10000 [IU] via SUBCUTANEOUS

## 2016-03-11 ENCOUNTER — Ambulatory Visit (INDEPENDENT_AMBULATORY_CARE_PROVIDER_SITE_OTHER): Payer: Medicare Other | Admitting: Cardiology

## 2016-03-11 DIAGNOSIS — Z5181 Encounter for therapeutic drug level monitoring: Secondary | ICD-10-CM

## 2016-03-11 DIAGNOSIS — I482 Chronic atrial fibrillation, unspecified: Secondary | ICD-10-CM

## 2016-03-11 LAB — POCT INR: INR: 2.3

## 2016-03-19 ENCOUNTER — Other Ambulatory Visit: Payer: Self-pay | Admitting: Cardiology

## 2016-03-25 ENCOUNTER — Ambulatory Visit (INDEPENDENT_AMBULATORY_CARE_PROVIDER_SITE_OTHER): Payer: Medicare Other | Admitting: Cardiology

## 2016-03-25 DIAGNOSIS — I482 Chronic atrial fibrillation, unspecified: Secondary | ICD-10-CM

## 2016-03-25 DIAGNOSIS — Z5181 Encounter for therapeutic drug level monitoring: Secondary | ICD-10-CM

## 2016-03-25 LAB — POCT INR: INR: 2.1

## 2016-03-28 ENCOUNTER — Encounter (HOSPITAL_COMMUNITY)
Admission: RE | Admit: 2016-03-28 | Discharge: 2016-03-28 | Disposition: A | Discharge status: Home / Self Care | Payer: Medicare Other | Source: Ambulatory Visit | Attending: Nephrology | Admitting: Nephrology

## 2016-03-28 DIAGNOSIS — N184 Chronic kidney disease, stage 4 (severe): Secondary | ICD-10-CM | POA: Diagnosis not present

## 2016-03-28 LAB — POCT HEMOGLOBIN-HEMACUE: HEMOGLOBIN: 10.4 g/dL — AB (ref 12.0–15.0)

## 2016-03-28 LAB — FERRITIN: FERRITIN: 492 ng/mL — AB (ref 11–307)

## 2016-03-28 LAB — IRON AND TIBC
Iron: 37 ug/dL (ref 28–170)
SATURATION RATIOS: 15 % (ref 10.4–31.8)
TIBC: 239 ug/dL — AB (ref 250–450)
UIBC: 202 ug/dL

## 2016-03-28 MED ORDER — EPOETIN ALFA 10000 UNIT/ML IJ SOLN
INTRAMUSCULAR | Status: AC
  Filled 2016-03-28: qty 1

## 2016-03-28 MED ORDER — EPOETIN ALFA 10000 UNIT/ML IJ SOLN
10000.0000 [IU] | INTRAMUSCULAR | Status: DC
  Administered 2016-03-28: 10000 [IU] via SUBCUTANEOUS

## 2016-04-07 ENCOUNTER — Ambulatory Visit (INDEPENDENT_AMBULATORY_CARE_PROVIDER_SITE_OTHER): Payer: Medicare Other | Admitting: Cardiovascular Disease

## 2016-04-07 DIAGNOSIS — Z7901 Long term (current) use of anticoagulants: Secondary | ICD-10-CM | POA: Diagnosis not present

## 2016-04-07 DIAGNOSIS — I482 Chronic atrial fibrillation, unspecified: Secondary | ICD-10-CM

## 2016-04-07 DIAGNOSIS — Z5181 Encounter for therapeutic drug level monitoring: Secondary | ICD-10-CM

## 2016-04-07 LAB — POCT INR: INR: 3

## 2016-04-22 ENCOUNTER — Ambulatory Visit (INDEPENDENT_AMBULATORY_CARE_PROVIDER_SITE_OTHER): Payer: Medicare Other

## 2016-04-22 DIAGNOSIS — I482 Chronic atrial fibrillation, unspecified: Secondary | ICD-10-CM

## 2016-04-22 DIAGNOSIS — Z5181 Encounter for therapeutic drug level monitoring: Secondary | ICD-10-CM

## 2016-04-22 LAB — POCT INR: INR: 3

## 2016-04-25 ENCOUNTER — Encounter (HOSPITAL_COMMUNITY)
Admission: RE | Admit: 2016-04-25 | Discharge: 2016-04-25 | Disposition: A | Discharge status: Home / Self Care | Payer: Medicare Other | Source: Ambulatory Visit | Attending: Nephrology | Admitting: Nephrology

## 2016-04-25 DIAGNOSIS — N184 Chronic kidney disease, stage 4 (severe): Secondary | ICD-10-CM

## 2016-04-25 LAB — POCT HEMOGLOBIN-HEMACUE: HEMOGLOBIN: 9.9 g/dL — AB (ref 12.0–15.0)

## 2016-04-25 LAB — IRON AND TIBC
IRON: 46 ug/dL (ref 28–170)
Saturation Ratios: 19 % (ref 10.4–31.8)
TIBC: 242 ug/dL — ABNORMAL LOW (ref 250–450)
UIBC: 196 ug/dL

## 2016-04-25 LAB — FERRITIN: FERRITIN: 441 ng/mL — AB (ref 11–307)

## 2016-04-25 MED ORDER — EPOETIN ALFA 10000 UNIT/ML IJ SOLN
INTRAMUSCULAR | Status: AC
  Filled 2016-04-25: qty 1

## 2016-04-25 MED ORDER — EPOETIN ALFA 10000 UNIT/ML IJ SOLN
10000.0000 [IU] | INTRAMUSCULAR | Status: DC
  Administered 2016-04-25: 10000 [IU] via SUBCUTANEOUS

## 2016-05-06 ENCOUNTER — Ambulatory Visit (INDEPENDENT_AMBULATORY_CARE_PROVIDER_SITE_OTHER): Payer: Medicare Other | Admitting: Cardiology

## 2016-05-06 DIAGNOSIS — I482 Chronic atrial fibrillation, unspecified: Secondary | ICD-10-CM

## 2016-05-06 DIAGNOSIS — Z5181 Encounter for therapeutic drug level monitoring: Secondary | ICD-10-CM

## 2016-05-06 LAB — POCT INR: INR: 3.2

## 2016-05-20 ENCOUNTER — Ambulatory Visit (INDEPENDENT_AMBULATORY_CARE_PROVIDER_SITE_OTHER): Payer: Medicare Other

## 2016-05-20 DIAGNOSIS — I482 Chronic atrial fibrillation, unspecified: Secondary | ICD-10-CM

## 2016-05-20 DIAGNOSIS — Z5181 Encounter for therapeutic drug level monitoring: Secondary | ICD-10-CM

## 2016-05-20 LAB — POCT INR: INR: 3.6

## 2016-05-23 ENCOUNTER — Encounter (HOSPITAL_COMMUNITY)
Admission: RE | Admit: 2016-05-23 | Discharge: 2016-05-23 | Disposition: A | Discharge status: Home / Self Care | Payer: Medicare Other | Source: Ambulatory Visit | Attending: Nephrology | Admitting: Nephrology

## 2016-05-23 DIAGNOSIS — N184 Chronic kidney disease, stage 4 (severe): Secondary | ICD-10-CM | POA: Insufficient documentation

## 2016-05-23 LAB — IRON AND TIBC
IRON: 47 ug/dL (ref 28–170)
Saturation Ratios: 19 % (ref 10.4–31.8)
TIBC: 245 ug/dL — ABNORMAL LOW (ref 250–450)
UIBC: 198 ug/dL

## 2016-05-23 LAB — POCT HEMOGLOBIN-HEMACUE: Hemoglobin: 9.9 g/dL — ABNORMAL LOW (ref 12.0–15.0)

## 2016-05-23 LAB — FERRITIN: Ferritin: 293 ng/mL (ref 11–307)

## 2016-05-23 MED ORDER — EPOETIN ALFA 10000 UNIT/ML IJ SOLN
INTRAMUSCULAR | Status: AC
  Filled 2016-05-23: qty 1

## 2016-05-23 MED ORDER — EPOETIN ALFA 10000 UNIT/ML IJ SOLN
10000.0000 [IU] | INTRAMUSCULAR | Status: DC
  Administered 2016-05-23: 10000 [IU] via SUBCUTANEOUS

## 2016-06-03 ENCOUNTER — Ambulatory Visit (INDEPENDENT_AMBULATORY_CARE_PROVIDER_SITE_OTHER): Payer: Medicare Other | Admitting: Internal Medicine

## 2016-06-03 DIAGNOSIS — Z5181 Encounter for therapeutic drug level monitoring: Secondary | ICD-10-CM

## 2016-06-03 DIAGNOSIS — Z7901 Long term (current) use of anticoagulants: Secondary | ICD-10-CM | POA: Diagnosis not present

## 2016-06-03 DIAGNOSIS — I482 Chronic atrial fibrillation, unspecified: Secondary | ICD-10-CM

## 2016-06-03 LAB — POCT INR: INR: 2.5

## 2016-06-13 DIAGNOSIS — G4734 Idiopathic sleep related nonobstructive alveolar hypoventilation: Secondary | ICD-10-CM | POA: Diagnosis not present

## 2016-06-13 DIAGNOSIS — H00019 Hordeolum externum unspecified eye, unspecified eyelid: Secondary | ICD-10-CM | POA: Diagnosis not present

## 2016-06-13 DIAGNOSIS — I5032 Chronic diastolic (congestive) heart failure: Secondary | ICD-10-CM | POA: Diagnosis not present

## 2016-06-15 ENCOUNTER — Other Ambulatory Visit: Payer: Self-pay | Admitting: Cardiology

## 2016-06-17 ENCOUNTER — Ambulatory Visit (INDEPENDENT_AMBULATORY_CARE_PROVIDER_SITE_OTHER): Payer: Medicare Other | Admitting: Internal Medicine

## 2016-06-17 ENCOUNTER — Other Ambulatory Visit (HOSPITAL_COMMUNITY): Payer: Self-pay | Admitting: *Deleted

## 2016-06-17 DIAGNOSIS — Z5181 Encounter for therapeutic drug level monitoring: Secondary | ICD-10-CM

## 2016-06-17 DIAGNOSIS — I482 Chronic atrial fibrillation, unspecified: Secondary | ICD-10-CM

## 2016-06-17 LAB — POCT INR: INR: 3.2

## 2016-06-20 ENCOUNTER — Ambulatory Visit (HOSPITAL_COMMUNITY)
Admission: RE | Admit: 2016-06-20 | Discharge: 2016-06-20 | Disposition: A | Discharge status: Home / Self Care | Payer: Medicare Other | Source: Ambulatory Visit | Attending: Nephrology | Admitting: Nephrology

## 2016-06-20 DIAGNOSIS — N184 Chronic kidney disease, stage 4 (severe): Secondary | ICD-10-CM | POA: Insufficient documentation

## 2016-06-20 DIAGNOSIS — D631 Anemia in chronic kidney disease: Secondary | ICD-10-CM | POA: Diagnosis not present

## 2016-06-20 LAB — POCT HEMOGLOBIN-HEMACUE: Hemoglobin: 10.1 g/dL — ABNORMAL LOW (ref 12.0–15.0)

## 2016-06-20 MED ORDER — EPOETIN ALFA 10000 UNIT/ML IJ SOLN
10000.0000 [IU] | INTRAMUSCULAR | Status: DC
  Administered 2016-06-20: 10000 [IU] via SUBCUTANEOUS

## 2016-06-20 MED ORDER — SODIUM CHLORIDE 0.9 % IV SOLN
510.0000 mg | Freq: Once | INTRAVENOUS | Status: AC
  Administered 2016-06-20: 510 mg via INTRAVENOUS
  Filled 2016-06-20: qty 17

## 2016-06-20 MED ORDER — EPOETIN ALFA 10000 UNIT/ML IJ SOLN
INTRAMUSCULAR | Status: AC
  Administered 2016-06-20: 10000 [IU] via SUBCUTANEOUS
  Filled 2016-06-20: qty 1

## 2016-06-23 DIAGNOSIS — H25813 Combined forms of age-related cataract, bilateral: Secondary | ICD-10-CM | POA: Diagnosis not present

## 2016-06-23 DIAGNOSIS — H5203 Hypermetropia, bilateral: Secondary | ICD-10-CM | POA: Diagnosis not present

## 2016-06-23 DIAGNOSIS — E11319 Type 2 diabetes mellitus with unspecified diabetic retinopathy without macular edema: Secondary | ICD-10-CM | POA: Diagnosis not present

## 2016-06-23 DIAGNOSIS — Z7984 Long term (current) use of oral hypoglycemic drugs: Secondary | ICD-10-CM | POA: Diagnosis not present

## 2016-06-23 DIAGNOSIS — H52223 Regular astigmatism, bilateral: Secondary | ICD-10-CM | POA: Diagnosis not present

## 2016-06-23 DIAGNOSIS — H524 Presbyopia: Secondary | ICD-10-CM | POA: Diagnosis not present

## 2016-06-23 DIAGNOSIS — E119 Type 2 diabetes mellitus without complications: Secondary | ICD-10-CM | POA: Diagnosis not present

## 2016-06-23 DIAGNOSIS — H1045 Other chronic allergic conjunctivitis: Secondary | ICD-10-CM | POA: Diagnosis not present

## 2016-06-23 DIAGNOSIS — H16223 Keratoconjunctivitis sicca, not specified as Sjogren's, bilateral: Secondary | ICD-10-CM | POA: Diagnosis not present

## 2016-06-23 DIAGNOSIS — Z794 Long term (current) use of insulin: Secondary | ICD-10-CM | POA: Diagnosis not present

## 2016-06-28 DIAGNOSIS — D631 Anemia in chronic kidney disease: Secondary | ICD-10-CM | POA: Diagnosis not present

## 2016-06-28 DIAGNOSIS — N184 Chronic kidney disease, stage 4 (severe): Secondary | ICD-10-CM | POA: Diagnosis not present

## 2016-06-28 DIAGNOSIS — N2581 Secondary hyperparathyroidism of renal origin: Secondary | ICD-10-CM | POA: Diagnosis not present

## 2016-06-29 ENCOUNTER — Telehealth: Payer: Self-pay | Admitting: Cardiology

## 2016-06-29 NOTE — Telephone Encounter (Signed)
06/29/16: I spoke with pt's daughter.  Weight today 202 lbs  06/28/16 201 lbs 06/27/16  201 lbs 06/27/16  201 lbs  Pt coughing up thick clear stringy phlegm last night that she can pull out of her throat.

## 2016-06-29 NOTE — Telephone Encounter (Signed)
Pt's daughter states no change in lower extremity edema or shortness of breath. Pt does use oxygen regularly at night and did not seem more short of breath during the night. Pt did take her weekly Zaroxlyn this morning in addition to usual lasix 120mg  bid.   I reviewed with Dr Angelena Form-- Per Dr Angelena Form, no new recommendations, monitor symptoms call if changes.

## 2016-06-29 NOTE — Telephone Encounter (Signed)
New Message    Pt daughter states that mother is pulling phlem up and waking her up at night. She thinks her mother is choking and does not know what to do.

## 2016-06-29 NOTE — Telephone Encounter (Signed)
I discussed recommendations with pt's daughter, Madeline [name].  I advised [name] to try to keep pt's mouth and throat hydrated to help break up phlegm, pt's daughter states pt uses a hospital bed and sleeps with head of bed elevated.

## 2016-07-01 ENCOUNTER — Ambulatory Visit (INDEPENDENT_AMBULATORY_CARE_PROVIDER_SITE_OTHER): Payer: Medicare Other | Admitting: Internal Medicine

## 2016-07-01 DIAGNOSIS — I482 Chronic atrial fibrillation, unspecified: Secondary | ICD-10-CM

## 2016-07-01 DIAGNOSIS — Z5181 Encounter for therapeutic drug level monitoring: Secondary | ICD-10-CM

## 2016-07-01 LAB — POCT INR: INR: 2.7

## 2016-07-15 ENCOUNTER — Ambulatory Visit (INDEPENDENT_AMBULATORY_CARE_PROVIDER_SITE_OTHER): Payer: Medicare Other

## 2016-07-15 DIAGNOSIS — I482 Chronic atrial fibrillation, unspecified: Secondary | ICD-10-CM

## 2016-07-15 DIAGNOSIS — Z5181 Encounter for therapeutic drug level monitoring: Secondary | ICD-10-CM

## 2016-07-15 LAB — POCT INR: INR: 3.3

## 2016-07-18 ENCOUNTER — Inpatient Hospital Stay (HOSPITAL_COMMUNITY): Admission: RE | Admit: 2016-07-18 | Payer: Medicare Other | Source: Ambulatory Visit

## 2016-07-21 ENCOUNTER — Encounter (HOSPITAL_COMMUNITY)
Admission: RE | Admit: 2016-07-21 | Discharge: 2016-07-21 | Disposition: A | Discharge status: Home / Self Care | Payer: Medicare Other | Source: Ambulatory Visit | Attending: Nephrology | Admitting: Nephrology

## 2016-07-21 DIAGNOSIS — N184 Chronic kidney disease, stage 4 (severe): Secondary | ICD-10-CM | POA: Insufficient documentation

## 2016-07-21 LAB — IRON AND TIBC
Iron: 48 ug/dL (ref 28–170)
SATURATION RATIOS: 22 % (ref 10.4–31.8)
TIBC: 223 ug/dL — AB (ref 250–450)
UIBC: 175 ug/dL

## 2016-07-21 LAB — FERRITIN: Ferritin: 151 ng/mL (ref 11–307)

## 2016-07-21 MED ORDER — EPOETIN ALFA 10000 UNIT/ML IJ SOLN: 10000.0000 [IU] | INTRAMUSCULAR | Status: DC

## 2016-07-21 MED ORDER — EPOETIN ALFA 10000 UNIT/ML IJ SOLN
INTRAMUSCULAR | Status: AC
  Administered 2016-07-21: 11:00:00 10000 [IU]
  Filled 2016-07-21: qty 1

## 2016-07-22 LAB — POCT HEMOGLOBIN-HEMACUE: Hemoglobin: 10.8 g/dL — ABNORMAL LOW (ref 12.0–15.0)

## 2016-08-01 ENCOUNTER — Ambulatory Visit (INDEPENDENT_AMBULATORY_CARE_PROVIDER_SITE_OTHER): Payer: Medicare Other | Admitting: Cardiovascular Disease

## 2016-08-01 DIAGNOSIS — I482 Chronic atrial fibrillation, unspecified: Secondary | ICD-10-CM

## 2016-08-01 DIAGNOSIS — Z7901 Long term (current) use of anticoagulants: Secondary | ICD-10-CM | POA: Diagnosis not present

## 2016-08-01 DIAGNOSIS — Z5181 Encounter for therapeutic drug level monitoring: Secondary | ICD-10-CM

## 2016-08-01 LAB — POCT INR: INR: 2.9

## 2016-08-03 ENCOUNTER — Other Ambulatory Visit: Payer: Self-pay | Admitting: *Deleted

## 2016-08-03 MED ORDER — METOLAZONE 2.5 MG PO TABS: ORAL_TABLET | ORAL | 1 refills | Status: DC

## 2016-08-03 NOTE — Telephone Encounter (Signed)
Chronic diastolic heart failure -trying to maintain weight. I will increase Metolazone to twice a week. Fluid, salt restriction. She is on a significant amount of loop diuretic, Lasix 120 mg twice a day. We will watch renal function closely, check basic metabolic profile again.  Signed, Candee Furbish, MD [facility]  01/21/2016 10:07 AM   PER OV & CONFIRMATION WITH PAM F. IM SENDING IN REFILL AS STATED ABOVE, 1 TAB PO BID WEEKLY

## 2016-08-12 ENCOUNTER — Ambulatory Visit (INDEPENDENT_AMBULATORY_CARE_PROVIDER_SITE_OTHER): Payer: Medicare Other

## 2016-08-12 DIAGNOSIS — I482 Chronic atrial fibrillation, unspecified: Secondary | ICD-10-CM

## 2016-08-12 DIAGNOSIS — Z5181 Encounter for therapeutic drug level monitoring: Secondary | ICD-10-CM

## 2016-08-12 LAB — POCT INR: INR: 2.7

## 2016-08-15 ENCOUNTER — Encounter (HOSPITAL_COMMUNITY)
Admission: RE | Admit: 2016-08-15 | Discharge: 2016-08-15 | Disposition: A | Discharge status: Home / Self Care | Payer: Medicare Other | Source: Ambulatory Visit | Attending: Nephrology | Admitting: Nephrology

## 2016-08-15 DIAGNOSIS — N184 Chronic kidney disease, stage 4 (severe): Secondary | ICD-10-CM | POA: Insufficient documentation

## 2016-08-15 LAB — FERRITIN: FERRITIN: 561 ng/mL — AB (ref 11–307)

## 2016-08-15 LAB — IRON AND TIBC
Iron: 50 ug/dL (ref 28–170)
Saturation Ratios: 22 % (ref 10.4–31.8)
TIBC: 232 ug/dL — ABNORMAL LOW (ref 250–450)
UIBC: 182 ug/dL

## 2016-08-15 LAB — POCT HEMOGLOBIN-HEMACUE: HEMOGLOBIN: 11.4 g/dL — AB (ref 12.0–15.0)

## 2016-08-15 MED ORDER — EPOETIN ALFA 10000 UNIT/ML IJ SOLN
10000.0000 [IU] | INTRAMUSCULAR | Status: DC
  Administered 2016-08-15: 11:00:00 10000 [IU] via SUBCUTANEOUS

## 2016-08-15 MED ORDER — EPOETIN ALFA 10000 UNIT/ML IJ SOLN
INTRAMUSCULAR | Status: AC
  Filled 2016-08-15: qty 1

## 2016-08-16 DIAGNOSIS — E890 Postprocedural hypothyroidism: Secondary | ICD-10-CM | POA: Diagnosis not present

## 2016-08-16 DIAGNOSIS — N184 Chronic kidney disease, stage 4 (severe): Secondary | ICD-10-CM | POA: Diagnosis not present

## 2016-08-16 DIAGNOSIS — Z0000 Encounter for general adult medical examination without abnormal findings: Secondary | ICD-10-CM | POA: Diagnosis not present

## 2016-08-16 DIAGNOSIS — Z794 Long term (current) use of insulin: Secondary | ICD-10-CM | POA: Diagnosis not present

## 2016-08-16 DIAGNOSIS — E1142 Type 2 diabetes mellitus with diabetic polyneuropathy: Secondary | ICD-10-CM | POA: Diagnosis not present

## 2016-08-16 DIAGNOSIS — E1122 Type 2 diabetes mellitus with diabetic chronic kidney disease: Secondary | ICD-10-CM | POA: Diagnosis not present

## 2016-08-16 DIAGNOSIS — M170 Bilateral primary osteoarthritis of knee: Secondary | ICD-10-CM | POA: Diagnosis not present

## 2016-08-20 ENCOUNTER — Other Ambulatory Visit: Payer: Self-pay | Admitting: Cardiology

## 2016-08-26 ENCOUNTER — Ambulatory Visit (INDEPENDENT_AMBULATORY_CARE_PROVIDER_SITE_OTHER): Payer: Medicare Other | Admitting: Cardiovascular Disease

## 2016-08-26 DIAGNOSIS — I482 Chronic atrial fibrillation, unspecified: Secondary | ICD-10-CM

## 2016-08-26 DIAGNOSIS — Z5181 Encounter for therapeutic drug level monitoring: Secondary | ICD-10-CM

## 2016-08-26 LAB — POCT INR: INR: 3.8

## 2016-09-06 ENCOUNTER — Encounter (HOSPITAL_COMMUNITY): Payer: Medicare Other

## 2016-09-09 ENCOUNTER — Other Ambulatory Visit (HOSPITAL_COMMUNITY): Payer: Self-pay | Admitting: *Deleted

## 2016-09-12 ENCOUNTER — Ambulatory Visit (INDEPENDENT_AMBULATORY_CARE_PROVIDER_SITE_OTHER): Payer: Medicare Other | Admitting: Cardiovascular Disease

## 2016-09-12 ENCOUNTER — Encounter (HOSPITAL_COMMUNITY)
Admission: RE | Admit: 2016-09-12 | Discharge: 2016-09-12 | Disposition: A | Discharge status: Home / Self Care | Payer: Medicare Other | Source: Ambulatory Visit | Attending: Nephrology | Admitting: Nephrology

## 2016-09-12 DIAGNOSIS — N184 Chronic kidney disease, stage 4 (severe): Secondary | ICD-10-CM | POA: Diagnosis not present

## 2016-09-12 DIAGNOSIS — Z5181 Encounter for therapeutic drug level monitoring: Secondary | ICD-10-CM

## 2016-09-12 DIAGNOSIS — I482 Chronic atrial fibrillation, unspecified: Secondary | ICD-10-CM

## 2016-09-12 LAB — IRON AND TIBC
Iron: 45 ug/dL (ref 28–170)
SATURATION RATIOS: 21 % (ref 10.4–31.8)
TIBC: 213 ug/dL — AB (ref 250–450)
UIBC: 168 ug/dL

## 2016-09-12 LAB — FERRITIN: Ferritin: 478 ng/mL — ABNORMAL HIGH (ref 11–307)

## 2016-09-12 LAB — POCT INR: INR: 3.7

## 2016-09-12 MED ORDER — EPOETIN ALFA 10000 UNIT/ML IJ SOLN
INTRAMUSCULAR | Status: AC
  Administered 2016-09-12: 10000 [IU] via SUBCUTANEOUS
  Filled 2016-09-12: qty 1

## 2016-09-12 MED ORDER — EPOETIN ALFA 10000 UNIT/ML IJ SOLN
10000.0000 [IU] | INTRAMUSCULAR | Status: DC
  Administered 2016-09-12: 10000 [IU] via SUBCUTANEOUS

## 2016-09-13 LAB — POCT HEMOGLOBIN-HEMACUE: Hemoglobin: 10.6 g/dL — ABNORMAL LOW (ref 12.0–15.0)

## 2016-09-23 ENCOUNTER — Ambulatory Visit (INDEPENDENT_AMBULATORY_CARE_PROVIDER_SITE_OTHER): Payer: Medicare Other | Admitting: Cardiology

## 2016-09-23 DIAGNOSIS — I482 Chronic atrial fibrillation, unspecified: Secondary | ICD-10-CM

## 2016-09-23 DIAGNOSIS — Z7901 Long term (current) use of anticoagulants: Secondary | ICD-10-CM | POA: Diagnosis not present

## 2016-09-23 DIAGNOSIS — Z5181 Encounter for therapeutic drug level monitoring: Secondary | ICD-10-CM

## 2016-09-23 LAB — POCT INR: INR: 3.2

## 2016-10-07 ENCOUNTER — Ambulatory Visit (INDEPENDENT_AMBULATORY_CARE_PROVIDER_SITE_OTHER): Payer: Medicare Other

## 2016-10-07 DIAGNOSIS — I482 Chronic atrial fibrillation, unspecified: Secondary | ICD-10-CM

## 2016-10-07 DIAGNOSIS — Z5181 Encounter for therapeutic drug level monitoring: Secondary | ICD-10-CM

## 2016-10-07 LAB — POCT INR: INR: 2.2

## 2016-10-10 ENCOUNTER — Encounter (HOSPITAL_COMMUNITY)
Admission: RE | Admit: 2016-10-10 | Discharge: 2016-10-10 | Disposition: A | Discharge status: Home / Self Care | Payer: Medicare Other | Source: Ambulatory Visit | Attending: Nephrology | Admitting: Nephrology

## 2016-10-10 DIAGNOSIS — N184 Chronic kidney disease, stage 4 (severe): Secondary | ICD-10-CM | POA: Insufficient documentation

## 2016-10-10 LAB — IRON AND TIBC
Iron: 44 ug/dL (ref 28–170)
SATURATION RATIOS: 19 % (ref 10.4–31.8)
TIBC: 227 ug/dL — ABNORMAL LOW (ref 250–450)
UIBC: 183 ug/dL

## 2016-10-10 LAB — POCT HEMOGLOBIN-HEMACUE: Hemoglobin: 10.4 g/dL — ABNORMAL LOW (ref 12.0–15.0)

## 2016-10-10 LAB — FERRITIN: FERRITIN: 619 ng/mL — AB (ref 11–307)

## 2016-10-10 MED ORDER — EPOETIN ALFA 10000 UNIT/ML IJ SOLN
INTRAMUSCULAR | Status: AC
  Filled 2016-10-10: qty 1

## 2016-10-10 MED ORDER — EPOETIN ALFA 10000 UNIT/ML IJ SOLN
10000.0000 [IU] | INTRAMUSCULAR | Status: DC
  Administered 2016-10-10: 10000 [IU] via SUBCUTANEOUS

## 2016-10-21 LAB — POCT INR: INR: 1.8

## 2016-10-24 ENCOUNTER — Telehealth: Payer: Self-pay | Admitting: Cardiology

## 2016-10-24 ENCOUNTER — Ambulatory Visit (INDEPENDENT_AMBULATORY_CARE_PROVIDER_SITE_OTHER): Payer: Medicare Other | Admitting: Cardiovascular Disease

## 2016-10-24 DIAGNOSIS — Z5181 Encounter for therapeutic drug level monitoring: Secondary | ICD-10-CM

## 2016-10-24 DIAGNOSIS — I482 Chronic atrial fibrillation, unspecified: Secondary | ICD-10-CM

## 2016-10-24 NOTE — Telephone Encounter (Signed)
Spoke with pt's dtr & gave dosing instructions & apolooized for not receiving INR results on Friday. She was given [facility] # to call if this problems arises in the future.

## 2016-10-24 NOTE — Telephone Encounter (Signed)
New message    Pt daughter is calling. She said it's about her mothers INR. She said no one called her to tell her what to do after she took it on Friday.

## 2016-10-24 NOTE — Telephone Encounter (Signed)
I spoke with pt's daughter, Randell Patient. [name] states pt's INR Friday October 21, 2016 was 1.8, she left this result on CV[facility] voice mail Friday. [name] advised I will forward to [facility] for review.

## 2016-11-04 ENCOUNTER — Ambulatory Visit (INDEPENDENT_AMBULATORY_CARE_PROVIDER_SITE_OTHER): Payer: Medicare Other | Admitting: Internal Medicine

## 2016-11-04 DIAGNOSIS — Z5181 Encounter for therapeutic drug level monitoring: Secondary | ICD-10-CM

## 2016-11-04 DIAGNOSIS — I482 Chronic atrial fibrillation, unspecified: Secondary | ICD-10-CM

## 2016-11-04 LAB — POCT INR: INR: 1.9

## 2016-11-07 ENCOUNTER — Encounter (HOSPITAL_COMMUNITY)
Admission: RE | Admit: 2016-11-07 | Discharge: 2016-11-07 | Disposition: A | Discharge status: Home / Self Care | Payer: Medicare Other | Source: Ambulatory Visit | Attending: Nephrology | Admitting: Nephrology

## 2016-11-07 DIAGNOSIS — N184 Chronic kidney disease, stage 4 (severe): Secondary | ICD-10-CM

## 2016-11-07 LAB — POCT HEMOGLOBIN-HEMACUE: HEMOGLOBIN: 10.6 g/dL — AB (ref 12.0–15.0)

## 2016-11-07 LAB — IRON AND TIBC
IRON: 44 ug/dL (ref 28–170)
Saturation Ratios: 19 % (ref 10.4–31.8)
TIBC: 234 ug/dL — AB (ref 250–450)
UIBC: 190 ug/dL

## 2016-11-07 LAB — FERRITIN: Ferritin: 537 ng/mL — ABNORMAL HIGH (ref 11–307)

## 2016-11-07 MED ORDER — EPOETIN ALFA 10000 UNIT/ML IJ SOLN
INTRAMUSCULAR | Status: AC
  Filled 2016-11-07: qty 1

## 2016-11-07 MED ORDER — EPOETIN ALFA 10000 UNIT/ML IJ SOLN
10000.0000 [IU] | INTRAMUSCULAR | Status: DC
  Administered 2016-11-07: 10000 [IU] via SUBCUTANEOUS

## 2016-11-20 ENCOUNTER — Emergency Department (HOSPITAL_BASED_OUTPATIENT_CLINIC_OR_DEPARTMENT_OTHER)
Admission: EM | Admit: 2016-11-20 | Discharge: 2016-11-20 | Disposition: A | Discharge status: Home / Self Care | Payer: Medicare Other | Attending: Emergency Medicine | Admitting: Emergency Medicine

## 2016-11-20 ENCOUNTER — Encounter (HOSPITAL_BASED_OUTPATIENT_CLINIC_OR_DEPARTMENT_OTHER): Payer: Self-pay

## 2016-11-20 ENCOUNTER — Emergency Department (HOSPITAL_BASED_OUTPATIENT_CLINIC_OR_DEPARTMENT_OTHER): Payer: Medicare Other

## 2016-11-20 DIAGNOSIS — J208 Acute bronchitis due to other specified organisms: Secondary | ICD-10-CM | POA: Diagnosis not present

## 2016-11-20 DIAGNOSIS — E1122 Type 2 diabetes mellitus with diabetic chronic kidney disease: Secondary | ICD-10-CM | POA: Diagnosis not present

## 2016-11-20 DIAGNOSIS — N184 Chronic kidney disease, stage 4 (severe): Secondary | ICD-10-CM | POA: Diagnosis not present

## 2016-11-20 DIAGNOSIS — I130 Hypertensive heart and chronic kidney disease with heart failure and stage 1 through stage 4 chronic kidney disease, or unspecified chronic kidney disease: Secondary | ICD-10-CM | POA: Diagnosis not present

## 2016-11-20 DIAGNOSIS — Z79899 Other long term (current) drug therapy: Secondary | ICD-10-CM | POA: Diagnosis not present

## 2016-11-20 DIAGNOSIS — Z7901 Long term (current) use of anticoagulants: Secondary | ICD-10-CM | POA: Diagnosis not present

## 2016-11-20 DIAGNOSIS — I5041 Acute combined systolic (congestive) and diastolic (congestive) heart failure: Secondary | ICD-10-CM | POA: Diagnosis not present

## 2016-11-20 DIAGNOSIS — Z794 Long term (current) use of insulin: Secondary | ICD-10-CM | POA: Insufficient documentation

## 2016-11-20 DIAGNOSIS — R05 Cough: Secondary | ICD-10-CM | POA: Diagnosis not present

## 2016-11-20 LAB — COMPREHENSIVE METABOLIC PANEL
ALBUMIN: 3.8 g/dL (ref 3.5–5.0)
ALK PHOS: 103 U/L (ref 38–126)
ALT: 26 U/L (ref 14–54)
ANION GAP: 11 (ref 5–15)
AST: 29 U/L (ref 15–41)
BILIRUBIN TOTAL: 0.7 mg/dL (ref 0.3–1.2)
BUN: 48 mg/dL — AB (ref 6–20)
CALCIUM: 9.7 mg/dL (ref 8.9–10.3)
CO2: 32 mmol/L (ref 22–32)
Chloride: 96 mmol/L — ABNORMAL LOW (ref 101–111)
Creatinine, Ser: 2.19 mg/dL — ABNORMAL HIGH (ref 0.44–1.00)
GFR calc Af Amer: 22 mL/min — ABNORMAL LOW (ref >60)
GFR calc non Af Amer: 19 mL/min — ABNORMAL LOW (ref >60)
GLUCOSE: 234 mg/dL — AB (ref 65–99)
Potassium: 4 mmol/L (ref 3.5–5.1)
Sodium: 139 mmol/L (ref 135–145)
TOTAL PROTEIN: 7.3 g/dL (ref 6.5–8.1)

## 2016-11-20 LAB — CBC WITH DIFFERENTIAL/PLATELET
BAND NEUTROPHILS: 3 %
Basophils Absolute: 0 10*3/uL (ref 0.0–0.1)
Basophils Relative: 0 %
EOS ABS: 0.3 10*3/uL (ref 0.0–0.7)
EOS PCT: 3 %
HEMATOCRIT: 32.8 % — AB (ref 36.0–46.0)
HEMOGLOBIN: 10.9 g/dL — AB (ref 12.0–15.0)
LYMPHS PCT: 23 %
Lymphs Abs: 2.1 10*3/uL (ref 0.7–4.0)
MCH: 32.8 pg (ref 26.0–34.0)
MCHC: 33.2 g/dL (ref 30.0–36.0)
MCV: 98.8 fL (ref 78.0–100.0)
MONOS PCT: 8 %
Monocytes Absolute: 0.7 10*3/uL (ref 0.1–1.0)
Neutro Abs: 6.2 10*3/uL (ref 1.7–7.7)
Neutrophils Relative %: 63 %
Platelets: 189 10*3/uL (ref 150–400)
RBC: 3.32 MIL/uL — ABNORMAL LOW (ref 3.87–5.11)
RDW: 16.1 % — ABNORMAL HIGH (ref 11.5–15.5)
WBC: 9.3 10*3/uL (ref 4.0–10.5)

## 2016-11-20 LAB — TROPONIN I: Troponin I: 0.03 ng/mL (ref <0.03)

## 2016-11-20 LAB — BRAIN NATRIURETIC PEPTIDE: B Natriuretic Peptide: 290.9 pg/mL — ABNORMAL HIGH (ref 0.0–100.0)

## 2016-11-20 MED ORDER — DOXYCYCLINE HYCLATE 100 MG PO CAPS: 100.0000 mg | ORAL_CAPSULE | Freq: Two times a day (BID) | ORAL | 0 refills | Status: DC

## 2016-11-20 MED ORDER — DOXYCYCLINE HYCLATE 100 MG PO TABS
100.0000 mg | ORAL_TABLET | Freq: Once | ORAL | Status: AC
  Administered 2016-11-20: 100 mg via ORAL
  Filled 2016-11-20: qty 1

## 2016-11-20 NOTE — ED Notes (Signed)
Patient transported to X-ray 

## 2016-11-20 NOTE — Discharge Instructions (Signed)
Doxycycline as prescribed.  Continue other medications as before.  Return to the emergency department if you develop worsening breathing, severe chest pain, or other new and concerning symptoms.

## 2016-11-20 NOTE — ED Triage Notes (Signed)
Pt reports productive cough and "I just feel bad." Reports hx of CHF and has been taking extra lasix pill at home.

## 2016-11-20 NOTE — ED Notes (Signed)
ED Provider at bedside. 

## 2016-11-20 NOTE — ED Notes (Signed)
Date and time results received: 11/20/16 1843 (use smartphrase ".now" to insert current time)  Test: Troponin Critical Value:0.03  Name of Provider Notified:Dr. Stark Jock and Sophie, RN  Orders Received? Or Actions Taken?: No additional orders received.

## 2016-11-20 NOTE — ED Provider Notes (Signed)
[facility] Provider Note   CSN: [id number] Arrival date & time: 11/20/16  1704  By signing my name below, I, Madeline [name], attest that this documentation has been prepared under the direction and in the presence of Veryl Speak, MD. Electronically Signed: Theresia [name], ED Scribe. 11/20/16. 6:08 PM.  History   Chief Complaint Chief Complaint  Patient presents with  . Cough   The history is provided by the patient. No language interpreter was used.  Cough  This is a new problem. The current episode started 12 to 24 hours ago. The problem occurs every few minutes. The problem has been gradually worsening. The cough is productive of sputum. There has been no fever. Pertinent negatives include no chest pain. She has tried nothing for the symptoms.   HPI Comments: Madeline [name] is a 83 y.o. female with a PMHx of CHF and DM, who presents to the Emergency Department complaining of a worsening, persistent productive cough onset today. Pt reports associated weight gain of 5 lbs, chronic leg swelling and urinary frequency due to medication. Pt takes a water pill regularly. No cardiac surgery. Pt denies fever, CP or any other complaints at this time.  Past Medical History:  Diagnosis Date  . Anemia 11/07/2012  . Atrial fibrillation ([place])   . CHF (congestive heart failure) ([place])   . Diabetes mellitus   . Hypertension   . Kidney failure   . Thyroid disease     Patient Active Problem List   Diagnosis Date Noted  . CAP (community acquired pneumonia) 01/22/2015  . Encounter for therapeutic drug monitoring 08/11/2014  . Chronic anticoagulation 03/19/2014  . Chronic diastolic heart failure ([place]) 03/19/2014  . Essential hypertension 03/19/2014  . UTI (urinary tract infection) 11/13/2012  . Gout flare 11/11/2012  . Alkalosis 11/10/2012  . Hypokalemia 11/07/2012  . Atrial fibrillation ([place]) 11/07/2012  . Anemia 11/07/2012  . Unspecified constipation 11/07/2012  . Acute on  chronic diastolic CHF (congestive heart failure) ([place]) 11/06/2012  . Acute diastolic CHF (congestive heart failure) ([place]) 11/04/2012  . CKD (chronic kidney disease) stage 4, GFR 15-29 ml/min ([facility]) 11/04/2012  . DM2 (diabetes mellitus, type 2) ([place]) 11/04/2012  . Systolic and diastolic CHF, acute on chronic ([place]) 11/04/2012  . Congestive heart failure, unspecified 10/05/2012    Class: Acute  . Physical deconditioning 10/04/2012  . Hypothyroidism 10/04/2012  . Acute on chronic renal failure ([place]) 10/04/2012    Past Surgical History:  Procedure Laterality Date  . ABDOMINAL HYSTERECTOMY    . THYROID SURGERY      OB History    No data available       Home Medications    Prior to Admission medications   Medication Sig Start Date End Date Taking? Authorizing Provider  acetaminophen (TYLENOL) 500 MG tablet Take 500 mg by mouth every 6 (six) hours as needed for mild pain, moderate pain, fever or headache.    [provider]  amLODipine (NORVASC) 10 MG tablet Take 10 mg by mouth daily.    [provider]  carvedilol (COREG) 6.25 MG tablet Take 6.25 mg by mouth 2 (two) times daily with a meal.    [provider]  cholecalciferol (VITAMIN D) 1000 UNITS tablet Take 1,000 Units by mouth daily.    [provider]  docusate sodium 100 MG CAPS Take 100 mg by mouth 2 (two) times daily. OTC 11/13/12   Eugenie Filler, MD  ferrous sulfate 325 (65 FE) MG tablet Take 1 tablet (325  mg total) by mouth 3 (three) times daily with meals. 11/13/12   Eugenie Filler, MD  furosemide (LASIX) 40 MG tablet Take 3 tablets (120 mg total) by mouth 2 (two) times daily. 11/13/12   Eugenie Filler, MD  glimepiride (AMARYL) 4 MG tablet Take 4 mg by mouth 2 (two) times daily.    [provider]  isosorbide mononitrate (IMDUR) 30 MG 24 hr tablet Take 30 mg by mouth daily.    [provider]  levothyroxine (SYNTHROID, LEVOTHROID) 175 MCG tablet Patient takes 1  tablet a day by mouth daily excepet on Sundays she takes 1 1/2 by mouth. 02/27/14   [provider]  losartan (COZAAR) 25 MG tablet Take 25 mg by mouth daily.     [provider]  metolazone (ZAROXOLYN) 2.5 MG tablet TAKE 1 TABLET BY MOUTH TWICE A WEEK 08/03/16   Jerline Pain, MD  mometasone (ELOCON) 0.1 % cream Apply 1 application topically daily.  01/14/14   [provider]  nitroGLYCERIN (NITROSTAT) 0.4 MG SL tablet Place 0.4 mg under the tongue every 5 (five) minutes as needed for chest pain.    [provider]  nystatin-triamcinolone (MYCOLOG II) cream Apply 1 application topically 2 (two) times daily.    [provider]  pantoprazole (PROTONIX) 40 MG tablet Take 40 mg by mouth 2 (two) times daily.    [provider]  potassium chloride SA (K-DUR,KLOR-CON) 20 MEQ tablet Take 2 tablets (40 mEq total) by mouth 2 (two) times daily. 11/13/12   Eugenie Filler, MD  pravastatin (PRAVACHOL) 40 MG tablet Take 40 mg by mouth at bedtime.     [provider]  predniSONE (DELTASONE) 10 MG tablet Take 10 mg by mouth daily as needed (gout flare up).    [provider]  senna (SENOKOT) 8.6 MG TABS tablet Take 2 tablets by mouth daily.    [provider]  silver sulfADIAZINE (SILVADENE) 1 % cream Apply 1 application topically daily.    [provider]  sitaGLIPtin (JANUVIA) 50 MG tablet Take 25 mg by mouth daily. Take half a tablet daily.    [provider]  TOUJEO SOLOSTAR 300 UNIT/ML SOPN 31 Units at bedtime.  08/24/14   [provider]  traMADol (ULTRAM) 50 MG tablet Take 1 tablet (50 mg total) by mouth every 6 (six) hours as needed for pain. 10/05/12   Orson Eva, MD  warfarin (COUMADIN) 3 MG tablet Take as directed by [facility] 08/22/16   Jerline Pain, MD    Family History Family History  Problem Relation Age of Onset  . Heart failure Neg Hx   . Heart attack Neg Hx   . Sudden death Neg Hx    . Stroke Neg Hx     Social History Social History  Substance Use Topics  . Smoking status: Never Smoker  . Smokeless tobacco: Never Used  . Alcohol use No     Allergies   Patient has no known allergies.   Review of Systems Review of Systems  Constitutional: Positive for unexpected weight change. Negative for fever.  Respiratory: Positive for cough.   Cardiovascular: Positive for leg swelling. Negative for chest pain.  All other systems reviewed and are negative.    Physical Exam Updated Vital Signs BP (!) 147/48   Pulse (!) 58   Temp 98.4 F (36.9 C) (Oral)   Resp 18   Wt 203 lb (92.1 kg)   SpO2 97%  BMI 37.13 kg/m   Physical Exam  Constitutional: She is oriented to person, place, and time. She appears well-developed and well-nourished. No distress.  HENT:  Head: Normocephalic and atraumatic.  Eyes: EOM are normal.  Neck: Normal range of motion.  Cardiovascular: Normal rate, regular rhythm and normal heart sounds.   Pulmonary/Chest: Effort normal. No respiratory distress. She has no wheezes. She has rales.  Slight rales in the bases bilaterally. Trace edema at the lower extremities.  Abdominal: Soft. She exhibits no distension. There is no tenderness.  Musculoskeletal: Normal range of motion.  Neurological: She is alert and oriented to person, place, and time.  Skin: Skin is warm and dry.  Psychiatric: She has a normal mood and affect. Judgment normal.  Nursing note and vitals reviewed.    ED Treatments / Results  DIAGNOSTIC STUDIES: Oxygen Saturation is 97% on RA, normal by my interpretation.   COORDINATION OF CARE: 6:07 PM-Discussed next steps with pt including lab work. Pt verbalized understanding and is agreeable with the plan.   Labs (all labs ordered are listed, but only abnormal results are displayed) Labs Reviewed  CBC WITH DIFFERENTIAL/PLATELET  COMPREHENSIVE METABOLIC PANEL  BRAIN NATRIURETIC PEPTIDE  TROPONIN I    EKG  EKG  Interpretation  Date/Time:  Sunday November 20 2016 17:43:30 EDT Ventricular Rate:  59 PR Interval:    QRS Duration: 88 QT Interval:  460 QTC Calculation: 456 R Axis:   70 Text Interpretation:  Junctional rhythm Low voltage, precordial leads Borderline T abnormalities, inferior leads Confirmed by Veryl Speak [phone]) on 11/20/2016 5:52:18 PM       Radiology Dg Chest 2 View  Result Date: 11/20/2016 CLINICAL DATA:  83 year old female with history grace respiratory effort, cough and fluid retention. EXAM: CHEST  2 VIEW COMPARISON:  Chest x-ray 08/03/2015. FINDINGS: Lung volumes are normal. Diffuse peribronchial cuffing and widespread interstitial prominence. No confluent consolidative airspace disease. No pleural effusions. No cephalization of the pulmonary vasculature. Mild cardiomegaly. Upper mediastinal contours are within normal limits. Aortic atherosclerosis. IMPRESSION: 1. Diffuse peribronchial cuffing, concerning for an acute bronchitis. There is also a generalize interstitial prominence, which appears relatively chronic and similar to recent prior examinations. 2. Mild cardiomegaly. 3. Aortic atherosclerosis. Electronically Signed   By: Vinnie Langton M.D.   On: 11/20/2016 17:48    Procedures Procedures (including critical care time)  Medications Ordered in ED Medications - No data to display   Initial Impression / Assessment and Plan / ED Course  I have reviewed the triage vital signs and the nursing notes.  Pertinent labs & imaging results that were available during my care of the patient were reviewed by me and considered in my medical decision making (see chart for details).  Symptoms most consistent with an acute bronchitis. Chest x-ray confirms this. He has a mildly elevated BNP, but cardiac workup is otherwise unremarkable. She will be discharged with antibiotics and follow-up as needed.  Final Clinical Impressions(s) / ED Diagnoses   Final diagnoses:  None     New Prescriptions New Prescriptions   No medications on file   I personally performed the services described in this documentation, which was scribed in my presence. The recorded information has been reviewed and is accurate.        Veryl Speak, MD 11/20/16 [phone]

## 2016-11-21 ENCOUNTER — Other Ambulatory Visit: Payer: Self-pay | Admitting: *Deleted

## 2016-11-21 ENCOUNTER — Ambulatory Visit (INDEPENDENT_AMBULATORY_CARE_PROVIDER_SITE_OTHER): Payer: Medicare Other | Admitting: Cardiology

## 2016-11-21 DIAGNOSIS — Z5181 Encounter for therapeutic drug level monitoring: Secondary | ICD-10-CM

## 2016-11-21 DIAGNOSIS — I482 Chronic atrial fibrillation, unspecified: Secondary | ICD-10-CM

## 2016-11-21 DIAGNOSIS — Z7901 Long term (current) use of anticoagulants: Secondary | ICD-10-CM | POA: Diagnosis not present

## 2016-11-21 LAB — POCT INR: INR: 3.5

## 2016-11-21 MED ORDER — WARFARIN SODIUM 3 MG PO TABS: ORAL_TABLET | ORAL | 3 refills | Status: DC

## 2016-11-21 NOTE — Telephone Encounter (Signed)
Pharmacy requests ninety day. 

## 2016-11-25 ENCOUNTER — Ambulatory Visit (INDEPENDENT_AMBULATORY_CARE_PROVIDER_SITE_OTHER): Payer: Medicare Other | Admitting: Internal Medicine

## 2016-11-25 DIAGNOSIS — I482 Chronic atrial fibrillation, unspecified: Secondary | ICD-10-CM

## 2016-11-25 DIAGNOSIS — Z5181 Encounter for therapeutic drug level monitoring: Secondary | ICD-10-CM

## 2016-11-25 LAB — POCT INR: INR: 2.1

## 2016-12-02 ENCOUNTER — Ambulatory Visit (INDEPENDENT_AMBULATORY_CARE_PROVIDER_SITE_OTHER): Payer: Medicare Other | Admitting: Internal Medicine

## 2016-12-02 DIAGNOSIS — Z5181 Encounter for therapeutic drug level monitoring: Secondary | ICD-10-CM

## 2016-12-02 DIAGNOSIS — I482 Chronic atrial fibrillation, unspecified: Secondary | ICD-10-CM

## 2016-12-02 LAB — POCT INR: INR: 1.7

## 2016-12-05 ENCOUNTER — Encounter (HOSPITAL_COMMUNITY)
Admission: RE | Admit: 2016-12-05 | Discharge: 2016-12-05 | Disposition: A | Discharge status: Home / Self Care | Payer: Medicare Other | Source: Ambulatory Visit | Attending: Nephrology | Admitting: Nephrology

## 2016-12-05 DIAGNOSIS — N184 Chronic kidney disease, stage 4 (severe): Secondary | ICD-10-CM

## 2016-12-05 LAB — IRON AND TIBC
Iron: 38 ug/dL (ref 28–170)
SATURATION RATIOS: 18 % (ref 10.4–31.8)
TIBC: 216 ug/dL — ABNORMAL LOW (ref 250–450)
UIBC: 178 ug/dL

## 2016-12-05 LAB — FERRITIN: FERRITIN: 537 ng/mL — AB (ref 11–307)

## 2016-12-05 LAB — POCT HEMOGLOBIN-HEMACUE: HEMOGLOBIN: 10.4 g/dL — AB (ref 12.0–15.0)

## 2016-12-05 MED ORDER — EPOETIN ALFA 10000 UNIT/ML IJ SOLN
10000.0000 [IU] | INTRAMUSCULAR | Status: DC
  Administered 2016-12-05: 11:00:00 10000 [IU] via SUBCUTANEOUS

## 2016-12-05 MED ORDER — EPOETIN ALFA 10000 UNIT/ML IJ SOLN
INTRAMUSCULAR | Status: AC
  Filled 2016-12-05: qty 1

## 2016-12-14 ENCOUNTER — Telehealth: Payer: Self-pay | Admitting: *Deleted

## 2016-12-14 ENCOUNTER — Inpatient Hospital Stay (HOSPITAL_BASED_OUTPATIENT_CLINIC_OR_DEPARTMENT_OTHER)
Admission: EM | Admit: 2016-12-14 | Discharge: 2016-12-16 | DRG: 291 | Disposition: A | Discharge status: Home / Self Care | Payer: Medicare Other | Attending: Family Medicine | Admitting: Family Medicine

## 2016-12-14 ENCOUNTER — Emergency Department (HOSPITAL_BASED_OUTPATIENT_CLINIC_OR_DEPARTMENT_OTHER): Payer: Medicare Other

## 2016-12-14 ENCOUNTER — Encounter (HOSPITAL_BASED_OUTPATIENT_CLINIC_OR_DEPARTMENT_OTHER): Payer: Self-pay | Admitting: Emergency Medicine

## 2016-12-14 DIAGNOSIS — R0602 Shortness of breath: Secondary | ICD-10-CM

## 2016-12-14 DIAGNOSIS — I10 Essential (primary) hypertension: Secondary | ICD-10-CM | POA: Diagnosis present

## 2016-12-14 DIAGNOSIS — E876 Hypokalemia: Secondary | ICD-10-CM | POA: Diagnosis present

## 2016-12-14 DIAGNOSIS — I5033 Acute on chronic diastolic (congestive) heart failure: Secondary | ICD-10-CM | POA: Diagnosis not present

## 2016-12-14 DIAGNOSIS — E1122 Type 2 diabetes mellitus with diabetic chronic kidney disease: Secondary | ICD-10-CM | POA: Diagnosis present

## 2016-12-14 DIAGNOSIS — Z7901 Long term (current) use of anticoagulants: Secondary | ICD-10-CM | POA: Diagnosis not present

## 2016-12-14 DIAGNOSIS — N189 Chronic kidney disease, unspecified: Secondary | ICD-10-CM | POA: Diagnosis not present

## 2016-12-14 DIAGNOSIS — N184 Chronic kidney disease, stage 4 (severe): Secondary | ICD-10-CM | POA: Diagnosis not present

## 2016-12-14 DIAGNOSIS — E1129 Type 2 diabetes mellitus with other diabetic kidney complication: Secondary | ICD-10-CM | POA: Diagnosis present

## 2016-12-14 DIAGNOSIS — E039 Hypothyroidism, unspecified: Secondary | ICD-10-CM | POA: Diagnosis present

## 2016-12-14 DIAGNOSIS — R079 Chest pain, unspecified: Secondary | ICD-10-CM

## 2016-12-14 DIAGNOSIS — Z9981 Dependence on supplemental oxygen: Secondary | ICD-10-CM

## 2016-12-14 DIAGNOSIS — E785 Hyperlipidemia, unspecified: Secondary | ICD-10-CM | POA: Diagnosis not present

## 2016-12-14 DIAGNOSIS — I248 Other forms of acute ischemic heart disease: Secondary | ICD-10-CM | POA: Diagnosis not present

## 2016-12-14 DIAGNOSIS — Z66 Do not resuscitate: Secondary | ICD-10-CM | POA: Diagnosis present

## 2016-12-14 DIAGNOSIS — K219 Gastro-esophageal reflux disease without esophagitis: Secondary | ICD-10-CM | POA: Diagnosis present

## 2016-12-14 DIAGNOSIS — Z90710 Acquired absence of both cervix and uterus: Secondary | ICD-10-CM

## 2016-12-14 DIAGNOSIS — I482 Chronic atrial fibrillation: Secondary | ICD-10-CM | POA: Diagnosis not present

## 2016-12-14 DIAGNOSIS — I4891 Unspecified atrial fibrillation: Secondary | ICD-10-CM | POA: Diagnosis present

## 2016-12-14 DIAGNOSIS — Z7984 Long term (current) use of oral hypoglycemic drugs: Secondary | ICD-10-CM

## 2016-12-14 DIAGNOSIS — D649 Anemia, unspecified: Secondary | ICD-10-CM

## 2016-12-14 DIAGNOSIS — D631 Anemia in chronic kidney disease: Secondary | ICD-10-CM | POA: Diagnosis present

## 2016-12-14 DIAGNOSIS — I509 Heart failure, unspecified: Secondary | ICD-10-CM | POA: Diagnosis not present

## 2016-12-14 DIAGNOSIS — J9621 Acute and chronic respiratory failure with hypoxia: Secondary | ICD-10-CM | POA: Diagnosis present

## 2016-12-14 DIAGNOSIS — R7989 Other specified abnormal findings of blood chemistry: Secondary | ICD-10-CM

## 2016-12-14 DIAGNOSIS — I130 Hypertensive heart and chronic kidney disease with heart failure and stage 1 through stage 4 chronic kidney disease, or unspecified chronic kidney disease: Principal | ICD-10-CM | POA: Diagnosis present

## 2016-12-14 DIAGNOSIS — Z79899 Other long term (current) drug therapy: Secondary | ICD-10-CM

## 2016-12-14 DIAGNOSIS — R748 Abnormal levels of other serum enzymes: Secondary | ICD-10-CM | POA: Diagnosis not present

## 2016-12-14 DIAGNOSIS — R778 Other specified abnormalities of plasma proteins: Secondary | ICD-10-CM

## 2016-12-14 HISTORY — DX: Pure hypercholesterolemia, unspecified: E78.00

## 2016-12-14 HISTORY — DX: Unspecified osteoarthritis, unspecified site: M19.90

## 2016-12-14 HISTORY — DX: Gout, unspecified: M10.9

## 2016-12-14 HISTORY — DX: Dependence on supplemental oxygen: Z99.81

## 2016-12-14 HISTORY — DX: Cardiac murmur, unspecified: R01.1

## 2016-12-14 HISTORY — DX: Type 2 diabetes mellitus without complications: E11.9

## 2016-12-14 HISTORY — DX: Chronic kidney disease, stage 4 (severe): N18.4

## 2016-12-14 HISTORY — DX: Hypothyroidism, unspecified: E03.9

## 2016-12-14 HISTORY — DX: Unspecified malignant neoplasm of skin, unspecified: C44.90

## 2016-12-14 HISTORY — DX: Pneumonia, unspecified organism: J18.9

## 2016-12-14 HISTORY — DX: Endocarditis, valve unspecified: I38

## 2016-12-14 LAB — URINALYSIS, ROUTINE W REFLEX MICROSCOPIC
BILIRUBIN URINE: NEGATIVE
Glucose, UA: NEGATIVE mg/dL
Hgb urine dipstick: NEGATIVE
KETONES UR: NEGATIVE mg/dL
Leukocytes, UA: NEGATIVE
NITRITE: NEGATIVE
PH: 6.5 (ref 5.0–8.0)
Protein, ur: NEGATIVE mg/dL
Specific Gravity, Urine: 1.008 (ref 1.005–1.030)

## 2016-12-14 LAB — BASIC METABOLIC PANEL
ANION GAP: 13 (ref 5–15)
BUN: 45 mg/dL — AB (ref 6–20)
CALCIUM: 9.5 mg/dL (ref 8.9–10.3)
CO2: 29 mmol/L (ref 22–32)
Chloride: 95 mmol/L — ABNORMAL LOW (ref 101–111)
Creatinine, Ser: 2.23 mg/dL — ABNORMAL HIGH (ref 0.44–1.00)
GFR calc Af Amer: 21 mL/min — ABNORMAL LOW (ref >60)
GFR, EST NON AFRICAN AMERICAN: 18 mL/min — AB (ref >60)
GLUCOSE: 295 mg/dL — AB (ref 65–99)
POTASSIUM: 3.8 mmol/L (ref 3.5–5.1)
Sodium: 137 mmol/L (ref 135–145)

## 2016-12-14 LAB — CBC
HCT: 32.4 % — ABNORMAL LOW (ref 36.0–46.0)
Hemoglobin: 10.7 g/dL — ABNORMAL LOW (ref 12.0–15.0)
MCH: 32.3 pg (ref 26.0–34.0)
MCHC: 33 g/dL (ref 30.0–36.0)
MCV: 97.9 fL (ref 78.0–100.0)
PLATELETS: 240 10*3/uL (ref 150–400)
RBC: 3.31 MIL/uL — AB (ref 3.87–5.11)
RDW: 16.4 % — ABNORMAL HIGH (ref 11.5–15.5)
WBC: 8.9 10*3/uL (ref 4.0–10.5)

## 2016-12-14 LAB — HEPATIC FUNCTION PANEL
ALBUMIN: 3.5 g/dL (ref 3.5–5.0)
ALK PHOS: 90 U/L (ref 38–126)
ALT: 24 U/L (ref 14–54)
AST: 23 U/L (ref 15–41)
BILIRUBIN TOTAL: 0.6 mg/dL (ref 0.3–1.2)
Bilirubin, Direct: 0.1 mg/dL (ref 0.1–0.5)
Indirect Bilirubin: 0.5 mg/dL (ref 0.3–0.9)
Total Protein: 7.4 g/dL (ref 6.5–8.1)

## 2016-12-14 LAB — TROPONIN I
TROPONIN I: 0.03 ng/mL — AB (ref <0.03)
Troponin I: 0.03 ng/mL (ref <0.03)

## 2016-12-14 LAB — PROTIME-INR
INR: 2.73
PROTHROMBIN TIME: 29.5 s — AB (ref 11.4–15.2)

## 2016-12-14 LAB — GLUCOSE, CAPILLARY: Glucose-Capillary: 185 mg/dL — ABNORMAL HIGH (ref 65–99)

## 2016-12-14 LAB — BRAIN NATRIURETIC PEPTIDE: B NATRIURETIC PEPTIDE 5: 385 pg/mL — AB (ref 0.0–100.0)

## 2016-12-14 MED ORDER — SODIUM CHLORIDE 0.9% FLUSH: 3.0000 mL | INTRAVENOUS | Status: DC | PRN

## 2016-12-14 MED ORDER — FERROUS SULFATE 325 (65 FE) MG PO TABS
325.0000 mg | ORAL_TABLET | Freq: Three times a day (TID) | ORAL | Status: DC
  Administered 2016-12-15 (×3): 325 mg via ORAL
  Filled 2016-12-14 (×3): qty 1

## 2016-12-14 MED ORDER — LEVOTHYROXINE SODIUM 75 MCG PO TABS
175.0000 ug | ORAL_TABLET | Freq: Every day | ORAL | Status: DC
  Administered 2016-12-15 – 2016-12-16 (×2): 175 ug via ORAL
  Filled 2016-12-14 (×2): qty 1

## 2016-12-14 MED ORDER — SODIUM CHLORIDE 0.9% FLUSH
3.0000 mL | Freq: Two times a day (BID) | INTRAVENOUS | Status: DC
  Administered 2016-12-14 – 2016-12-16 (×3): 3 mL via INTRAVENOUS

## 2016-12-14 MED ORDER — SENNA 8.6 MG PO TABS
2.0000 | ORAL_TABLET | Freq: Every day | ORAL | Status: DC
  Administered 2016-12-15 – 2016-12-16 (×2): 17.2 mg via ORAL
  Filled 2016-12-14 (×2): qty 2

## 2016-12-14 MED ORDER — SILVER SULFADIAZINE 1 % EX CREA
1.0000 "application " | TOPICAL_CREAM | Freq: Every day | CUTANEOUS | Status: DC
  Administered 2016-12-15 – 2016-12-16 (×2): 1 via TOPICAL
  Filled 2016-12-14: qty 85

## 2016-12-14 MED ORDER — INSULIN GLARGINE 100 UNIT/ML ~~LOC~~ SOLN
25.0000 [IU] | Freq: Every day | SUBCUTANEOUS | Status: DC
  Administered 2016-12-14 – 2016-12-15 (×2): 25 [IU] via SUBCUTANEOUS
  Filled 2016-12-14 (×3): qty 0.25

## 2016-12-14 MED ORDER — ISOSORBIDE MONONITRATE ER 30 MG PO TB24
30.0000 mg | ORAL_TABLET | Freq: Every day | ORAL | Status: DC
  Administered 2016-12-15 – 2016-12-16 (×2): 30 mg via ORAL
  Filled 2016-12-14 (×2): qty 1

## 2016-12-14 MED ORDER — WARFARIN SODIUM 3 MG PO TABS
3.0000 mg | ORAL_TABLET | Freq: Once | ORAL | Status: AC
  Administered 2016-12-14: 3 mg via ORAL
  Filled 2016-12-14: qty 1

## 2016-12-14 MED ORDER — AMLODIPINE BESYLATE 10 MG PO TABS
10.0000 mg | ORAL_TABLET | Freq: Every day | ORAL | Status: DC
  Administered 2016-12-15 – 2016-12-16 (×2): 10 mg via ORAL
  Filled 2016-12-14 (×2): qty 1

## 2016-12-14 MED ORDER — ONDANSETRON HCL 4 MG/2ML IJ SOLN: 4.0000 mg | Freq: Four times a day (QID) | INTRAMUSCULAR | Status: DC | PRN

## 2016-12-14 MED ORDER — NYSTATIN-TRIAMCINOLONE 100000-0.1 UNIT/GM-% EX CREA: 1.0000 "application " | TOPICAL_CREAM | Freq: Two times a day (BID) | CUTANEOUS | Status: DC

## 2016-12-14 MED ORDER — LOSARTAN POTASSIUM 25 MG PO TABS
25.0000 mg | ORAL_TABLET | Freq: Every day | ORAL | Status: DC
  Administered 2016-12-15 – 2016-12-16 (×2): 25 mg via ORAL
  Filled 2016-12-14 (×2): qty 1

## 2016-12-14 MED ORDER — TRIAMCINOLONE ACETONIDE 0.1 % EX CREA
1.0000 "application " | TOPICAL_CREAM | Freq: Every day | CUTANEOUS | Status: DC
  Administered 2016-12-15 – 2016-12-16 (×2): 1 via TOPICAL
  Filled 2016-12-14: qty 15

## 2016-12-14 MED ORDER — DOCUSATE SODIUM 100 MG PO CAPS
100.0000 mg | ORAL_CAPSULE | Freq: Two times a day (BID) | ORAL | Status: DC
  Administered 2016-12-14 – 2016-12-16 (×4): 100 mg via ORAL
  Filled 2016-12-14 (×4): qty 1

## 2016-12-14 MED ORDER — PANTOPRAZOLE SODIUM 40 MG PO TBEC
40.0000 mg | DELAYED_RELEASE_TABLET | Freq: Two times a day (BID) | ORAL | Status: DC
  Administered 2016-12-15 – 2016-12-16 (×3): 40 mg via ORAL
  Filled 2016-12-14 (×3): qty 1

## 2016-12-14 MED ORDER — NITROGLYCERIN 0.4 MG SL SUBL: 0.4000 mg | SUBLINGUAL_TABLET | SUBLINGUAL | Status: DC | PRN

## 2016-12-14 MED ORDER — ACETAMINOPHEN 500 MG PO TABS
500.0000 mg | ORAL_TABLET | Freq: Four times a day (QID) | ORAL | Status: DC | PRN
  Filled 2016-12-14: qty 1

## 2016-12-14 MED ORDER — ACETAMINOPHEN 500 MG PO TABS
ORAL_TABLET | ORAL | Status: AC
  Filled 2016-12-14: qty 2

## 2016-12-14 MED ORDER — CARVEDILOL 6.25 MG PO TABS
6.2500 mg | ORAL_TABLET | Freq: Two times a day (BID) | ORAL | Status: DC
  Administered 2016-12-15 (×2): 6.25 mg via ORAL
  Filled 2016-12-14 (×2): qty 1

## 2016-12-14 MED ORDER — ACETAMINOPHEN 500 MG PO TABS: 500.0000 mg | ORAL_TABLET | Freq: Four times a day (QID) | ORAL | Status: DC | PRN

## 2016-12-14 MED ORDER — DEXTROSE 5 % IV SOLN
120.0000 mg | Freq: Two times a day (BID) | INTRAVENOUS | Status: DC
  Administered 2016-12-14 – 2016-12-15 (×2): 120 mg via INTRAVENOUS
  Filled 2016-12-14 (×2): qty 12

## 2016-12-14 MED ORDER — PRAVASTATIN SODIUM 40 MG PO TABS
40.0000 mg | ORAL_TABLET | Freq: Every day | ORAL | Status: DC
  Administered 2016-12-14 – 2016-12-15 (×2): 40 mg via ORAL
  Filled 2016-12-14 (×2): qty 1

## 2016-12-14 MED ORDER — ZOLPIDEM TARTRATE 5 MG PO TABS: 5.0000 mg | ORAL_TABLET | Freq: Every evening | ORAL | Status: DC | PRN

## 2016-12-14 MED ORDER — TRAMADOL HCL 50 MG PO TABS: 50.0000 mg | ORAL_TABLET | Freq: Four times a day (QID) | ORAL | Status: DC | PRN

## 2016-12-14 MED ORDER — SODIUM CHLORIDE 0.9 % IV SOLN: 250.0000 mL | INTRAVENOUS | Status: DC | PRN

## 2016-12-14 MED ORDER — HYDRALAZINE HCL 20 MG/ML IJ SOLN: 5.0000 mg | INTRAMUSCULAR | Status: DC | PRN

## 2016-12-14 MED ORDER — VITAMIN D 1000 UNITS PO TABS
1000.0000 [IU] | ORAL_TABLET | Freq: Every day | ORAL | Status: DC
  Administered 2016-12-15 – 2016-12-16 (×2): 1000 [IU] via ORAL
  Filled 2016-12-14 (×2): qty 1

## 2016-12-14 MED ORDER — WARFARIN - PHARMACIST DOSING INPATIENT: Freq: Every day | Status: DC

## 2016-12-14 NOTE — Progress Notes (Signed)
ANTICOAGULATION CONSULT NOTE - Initial Consult  Pharmacy Consult for Coumadin Indication: atrial fibrillation  No Known Allergies  Patient Measurements: Height: 5\' 2"  (157.5 cm) Weight: 193 lb 1.6 oz (87.6 kg) IBW/kg (Calculated) : 50.1  Assessment: 83 yo F presents on 8/8 with SOB. On Coumadin 6mg  daily exc for 3mg  on Mon PTA for Afib. INR on 7/27 was 1.7. Last dose was taken on 8/7 per daughter. INR on admit is 2.73. Since near higher end of goal, will give lower dose tonight. Hgb 10.7, plts wnl.   Goal of Therapy:  INR 2-3 Monitor platelets by anticoagulation protocol: Yes   Plan:  Give Coumadin 3mg  PO tonight Monitor daily INR, CBC, s/s of bleed   Elenor Quinones, PharmD, BCPS Clinical Pharmacist Pager [phone] 12/14/2016 8:21 PM

## 2016-12-14 NOTE — ED Notes (Signed)
Attempted to call report x 1  

## 2016-12-14 NOTE — ED Provider Notes (Signed)
[facility] Provider Note   CSN: [id number] Arrival date & time: 12/14/16  1252     History   Chief Complaint Chief Complaint  Patient presents with  . Shortness of Breath    HPI Madeline [name] is a 83 y.o. female with a PMHx of Afib on coumadin, HTN, CKD, DM2, anemia, hypothyroidism, CHF, and other medical conditions listed below, who presents to the ED with complaints of gradually worsening shortness of breath, fatigue, cough, and exertional chest pain 1 week. Patient states that she was treated for bronchitis one month ago with doxycycline, never really felt any better afterwards, but over the last 1 week she has had worsening shortness of breath with exertion and increasing oxygen requirement. She is usually only on 2 L via [place] at night or when she takes naps, however over the last 24 hours she has been noted to have an oxygen level of 88% on RA during the day, so she's been needing additional O2 even while she's awake. She states that she feels generally weak. She continues to have a cough with yellowish-white knee sputum production. She states that when she exerts herself she feels short of breath, followed by palpitations, and then has some chest tightness across the center of her chest. She denies any ongoing chest pain at this time, states it only happens when she exerts herself. She describes the pain is 9/10 intermittent central chest pain that radiates into both arms, worse with exertion, and improved entirely with rest. Patient's family has noticed that over the last 2 days she has gained 3 pounds going from 200lbs to 203lbs since Monday, despite taking her Lasix 120 mg twice daily and her additional Zaroxolyn 2.5 mg on Monday as well as today; however they haven't noticed any LE swelling, so they're concerned that it is "building up around her heart". They've not noticed any increasing orthopnea. She reports that her daughter is also sick however she was sick first. No other  known sick contacts. She is a nonsmoker. PCP is at [facility], and her cardiologist is Dr. Marlou Porch. Patient denies any fevers, chills, rhinorrhea, LE swelling, orthopnea, hemoptysis, recent travel/surgery/immobilization, estrogen use, family or personal history of DVT/PE, abdominal pain, nausea, vomiting, diarrhea, constipation, dysuria, hematuria, numbness, tingling, focal weakness, or any other complaints at this time.   The history is provided by medical records and the patient. No language interpreter was used.  Shortness of Breath  This is a new problem. The average episode lasts 1 week. The problem occurs continuously.The current episode started more than 2 days ago. The problem has been gradually worsening. Associated symptoms include cough, sputum production and chest pain. Pertinent negatives include no fever, no rhinorrhea, no hemoptysis, no orthopnea, no vomiting, no abdominal pain and no leg swelling. Treatments tried: fluid pills. The treatment provided no relief. She has had prior hospitalizations. She has had prior ED visits. Associated medical issues include heart failure.    Past Medical History:  Diagnosis Date  . Anemia 11/07/2012  . Atrial fibrillation ([place])   . CHF (congestive heart failure) ([place])   . Diabetes mellitus   . Hypertension   . Kidney failure   . Thyroid disease     Patient Active Problem List   Diagnosis Date Noted  . CAP (community acquired pneumonia) 01/22/2015  . Encounter for therapeutic drug monitoring 08/11/2014  . Chronic anticoagulation 03/19/2014  . Chronic diastolic heart failure ([place]) 03/19/2014  . Essential hypertension 03/19/2014  . UTI (urinary tract infection)  11/13/2012  . Gout flare 11/11/2012  . Alkalosis 11/10/2012  . Hypokalemia 11/07/2012  . Atrial fibrillation ([place]) 11/07/2012  . Anemia 11/07/2012  . Unspecified constipation 11/07/2012  . Acute on chronic diastolic CHF (congestive heart failure) ([place]) 11/06/2012  . Acute  diastolic CHF (congestive heart failure) ([place]) 11/04/2012  . CKD (chronic kidney disease) stage 4, GFR 15-29 ml/min (HCC) 11/04/2012  . DM2 (diabetes mellitus, type 2) ([place]) 11/04/2012  . Systolic and diastolic CHF, acute on chronic ([place]) 11/04/2012  . Congestive heart failure, unspecified 10/05/2012    Class: Acute  . Physical deconditioning 10/04/2012  . Hypothyroidism 10/04/2012  . Acute on chronic renal failure ([place]) 10/04/2012    Past Surgical History:  Procedure Laterality Date  . ABDOMINAL HYSTERECTOMY    . THYROID SURGERY      OB History    No data available       Home Medications    Prior to Admission medications   Medication Sig Start Date End Date Taking? Authorizing Provider  acetaminophen (TYLENOL) 500 MG tablet Take 500 mg by mouth every 6 (six) hours as needed for mild pain, moderate pain, fever or headache.    [provider]  amLODipine (NORVASC) 10 MG tablet Take 10 mg by mouth daily.    [provider]  carvedilol (COREG) 6.25 MG tablet Take 6.25 mg by mouth 2 (two) times daily with a meal.    [provider]  cholecalciferol (VITAMIN D) 1000 UNITS tablet Take 1,000 Units by mouth daily.    [provider]  docusate sodium 100 MG CAPS Take 100 mg by mouth 2 (two) times daily. OTC 11/13/12   Eugenie Filler, MD  doxycycline (VIBRAMYCIN) 100 MG capsule Take 1 capsule (100 mg total) by mouth 2 (two) times daily. One po bid x 7 days 11/20/16   Veryl Speak, MD  ferrous sulfate 325 (65 FE) MG tablet Take 1 tablet (325 mg total) by mouth 3 (three) times daily with meals. 11/13/12   Eugenie Filler, MD  furosemide (LASIX) 40 MG tablet Take 3 tablets (120 mg total) by mouth 2 (two) times daily. 11/13/12   Eugenie Filler, MD  glimepiride (AMARYL) 4 MG tablet Take 4 mg by mouth 2 (two) times daily.    [provider]  isosorbide mononitrate (IMDUR) 30 MG 24 hr tablet Take 30 mg by mouth daily.    [provider]    levothyroxine (SYNTHROID, LEVOTHROID) 175 MCG tablet Patient takes 1 tablet a day by mouth daily excepet on Sundays she takes 1 1/2 by mouth. 02/27/14   [provider]  losartan (COZAAR) 25 MG tablet Take 25 mg by mouth daily.     [provider]  metolazone (ZAROXOLYN) 2.5 MG tablet TAKE 1 TABLET BY MOUTH TWICE A WEEK 08/03/16   Jerline Pain, MD  mometasone (ELOCON) 0.1 % cream Apply 1 application topically daily.  01/14/14   [provider]  nitroGLYCERIN (NITROSTAT) 0.4 MG SL tablet Place 0.4 mg under the tongue every 5 (five) minutes as needed for chest pain.    [provider]  nystatin-triamcinolone (MYCOLOG II) cream Apply 1 application topically 2 (two) times daily.    [provider]  pantoprazole (PROTONIX) 40 MG tablet Take 40 mg by mouth 2 (two) times daily.    [provider]  potassium chloride SA (K-DUR,KLOR-CON) 20 MEQ tablet Take 2 tablets (40 mEq total) by mouth 2 (two) times daily. 11/13/12   Eugenie Filler,  MD  pravastatin (PRAVACHOL) 40 MG tablet Take 40 mg by mouth at bedtime.     [provider]  predniSONE (DELTASONE) 10 MG tablet Take 10 mg by mouth daily as needed (gout flare up).    [provider]  senna (SENOKOT) 8.6 MG TABS tablet Take 2 tablets by mouth daily.    [provider]  silver sulfADIAZINE (SILVADENE) 1 % cream Apply 1 application topically daily.    [provider]  sitaGLIPtin (JANUVIA) 50 MG tablet Take 25 mg by mouth daily. Take half a tablet daily.    [provider]  TOUJEO SOLOSTAR 300 UNIT/ML SOPN 31 Units at bedtime.  08/24/14   [provider]  traMADol (ULTRAM) 50 MG tablet Take 1 tablet (50 mg total) by mouth every 6 (six) hours as needed for pain. 10/05/12   Orson Eva, MD  warfarin (COUMADIN) 3 MG tablet Take as directed by [facility] 11/21/16   Jerline Pain, MD    Family History Family History  Problem Relation Age of Onset  .  Heart failure Neg Hx   . Heart attack Neg Hx   . Sudden death Neg Hx   . Stroke Neg Hx     Social History Social History  Substance Use Topics  . Smoking status: Never Smoker  . Smokeless tobacco: Never Used  . Alcohol use No     Allergies   Patient has no known allergies.   Review of Systems Review of Systems  Constitutional: Positive for fatigue and unexpected weight change. Negative for chills and fever.  HENT: Negative for rhinorrhea.   Respiratory: Positive for cough, sputum production and shortness of breath. Negative for hemoptysis.   Cardiovascular: Positive for chest pain and palpitations. Negative for orthopnea and leg swelling.  Gastrointestinal: Negative for abdominal pain, constipation, diarrhea, nausea and vomiting.  Genitourinary: Negative for dysuria and hematuria.  Musculoskeletal: Negative for arthralgias and myalgias.  Skin: Negative for color change.  Allergic/Immunologic: Positive for immunocompromised state (DM2).  Neurological: Negative for weakness and numbness.  Hematological: Bruises/bleeds easily (on coumadin).  Psychiatric/Behavioral: Negative for confusion.   All other systems reviewed and are negative for acute change except as noted in the HPI.    Physical Exam Updated Vital Signs BP (!) 163/39 (BP Location: Right Arm)   Pulse 72   Temp 99.8 F (37.7 C) (Oral)   Resp (!) 24   Wt 92.1 kg (203 lb)   SpO2 92%   BMI 37.13 kg/m   Physical Exam  Constitutional: She is oriented to person, place, and time. Vital signs are normal. She appears well-developed and well-nourished.  Non-toxic appearance. No distress.  Afebrile, nontoxic, NAD, looks tired  HENT:  Head: Normocephalic and atraumatic.  Mouth/Throat: Oropharynx is clear and moist and mucous membranes are normal.  Eyes: Conjunctivae and EOM are normal. Right eye exhibits no discharge. Left eye exhibits no discharge.  Neck: Normal range of motion. Neck supple.  Cardiovascular: Normal  rate, regular rhythm, normal heart sounds and intact distal pulses.  Exam reveals no gallop and no friction rub.   No murmur heard. RRR, nl s1/s2, no m/r/g, distal pulses intact, no pedal edema   Pulmonary/Chest: Effort normal. No respiratory distress. She has no decreased breath sounds. She has no wheezes. She has no rhonchi. She has rales.  Minimal bibasilar rales, no wheezing/rhonchi, no increased WOB, speaking in full sentences, SpO2 96-98% on 2L via [place]   Abdominal: Soft. Normal appearance and bowel sounds are normal.  She exhibits no distension. There is no tenderness. There is no rigidity, no rebound, no guarding, no CVA tenderness, no tenderness at McBurney's point and negative Murphy's sign.  Musculoskeletal: Normal range of motion.  Neurological: She is alert and oriented to person, place, and time. She has normal strength. No sensory deficit.  Skin: Skin is warm, dry and intact. No rash noted.  Psychiatric: She has a normal mood and affect.  Nursing note and vitals reviewed.    ED Treatments / Results  Labs (all labs ordered are listed, but only abnormal results are displayed) Labs Reviewed  BASIC METABOLIC PANEL - Abnormal; Notable for the following:       Result Value   Chloride 95 (*)    Glucose, Bld 295 (*)    BUN 45 (*)    Creatinine, Ser 2.23 (*)    GFR calc non Af Amer 18 (*)    GFR calc Af Amer 21 (*)    All other components within normal limits  CBC - Abnormal; Notable for the following:    RBC 3.31 (*)    Hemoglobin 10.7 (*)    HCT 32.4 (*)    RDW 16.4 (*)    All other components within normal limits  BRAIN NATRIURETIC PEPTIDE - Abnormal; Notable for the following:    B Natriuretic Peptide 385.0 (*)    All other components within normal limits  TROPONIN I - Abnormal; Notable for the following:    Troponin I 0.03 (*)    All other components within normal limits  URINALYSIS, ROUTINE W REFLEX MICROSCOPIC  HEPATIC FUNCTION PANEL    EKG  EKG  Interpretation None       Radiology Dg Chest 2 View  Result Date: 12/14/2016 CLINICAL DATA:  shob x 1 day. H/o CHF, a-fib, previous symptoms in past. EXAM: CHEST - 2 VIEW COMPARISON:  11/20/2016 FINDINGS: Interval increase in mild diffuse interstitial opacities. Some patchy perihilar airspace opacities left worse than right, new since previous study. Heart size upper limits normal.  Atheromatous aorta. No effusion. Anterior vertebral endplate spurring at multiple levels in the lower thoracic spine. IMPRESSION: 1. Some increase in asymmetric interstitial and airspace edema or infiltrates as above. 2.  Aortic Atherosclerosis (ICD10-170.0) Electronically Signed   By: Lucrezia Europe M.D.   On: 12/14/2016 13:51    Procedures Procedures (including critical care time)  Medications Ordered in ED Medications  acetaminophen (TYLENOL) 500 MG tablet (not administered)     Initial Impression / Assessment and Plan / ED Course  I have reviewed the triage vital signs and the nursing notes.  Pertinent labs & imaging results that were available during my care of the patient were reviewed by me and considered in my medical decision making (see chart for details).     83 y.o. female here with worsening exertional CP, SOB, increasing oxygen requirement, ongoing cough, and generalized malaise/fatigue x1 wk. Had been dx'd with bronchitis 1 month ago, took doxycycline course, and didn't really feel improved. Over last 1 wk has noticed worsening SOB particularly with exertion. Some CP with exertion that improves with rest, denies current CP. Noticed increased weight despite taking lasix and zaroxolyn. On exam, using 2L via [place] and O2 96-98%, appears tired, no LE swelling, no tachycardia, lungs without rhonchi, minimal bibasilar rales; afebrile. CBC with chronic baseline anemia, BMP with baseline CKD. CXR with asymmetric interstitial and airspace edema or infiltrates. EKG without acute ischemic findings. Seems more  likely CHF rather than PNA, however CXR  makes it hard to tell. Will get BNP to help guide our management; will also add-on troponin, LFTs, and U/A. Pt requested tylenol, will give this now; will reassess shortly. Will hold off on lasix/abx until we have the BNP results.   4:30 PM (DURING DOWNTIME) BNP 385, up from last time. Trop mildly elevated at 0.03 which is actually similar to last month's, likely from CHF exacerbation/demand. LFTs WNL, U/A unremarkable. Symptoms most likely CHF related; consulted hospitalist. Dr. Marily Memos returned page, agreed with transfer to [facility] for admission. Holding orders to be placed by admitting team. Please see their notes for further documentation of care. I appreciate their help with this pleasant pt's care. Pt stable at time of admission and transfer.    Final Clinical Impressions(s) / ED Diagnoses   Final diagnoses:  Acute on chronic congestive heart failure, unspecified heart failure type (HCC)  SOB (shortness of breath)  Exertional chest pain  Chronic kidney disease, unspecified CKD stage  Elevated troponin I level  Chronic anemia    New Prescriptions New Prescriptions   No medications on [address] [place], [place] 12/14/16 1731    Tegeler, Gwenyth Allegra, MD 12/16/16 2135

## 2016-12-14 NOTE — Telephone Encounter (Signed)
Dtr called to inform us that pt was being treated at [facility] [address] & being transferred & admitted to the Hospital today. She will call once pt is discharged.

## 2016-12-14 NOTE — ED Notes (Signed)
SEE DOWNTIME FORMS

## 2016-12-14 NOTE — ED Triage Notes (Signed)
Patient reports that she was here 3 weeks ago for the same. And dx with Bronchitis. The pateint reports that over the last 2-3 days she has had increased SOB sitting, requiring more o2 and RA sats 88 with activity during the say

## 2016-12-14 NOTE — Progress Notes (Signed)
Pt. With troponin of 0.03. On call for [facility] paged to make aware.

## 2016-12-14 NOTE — H&P (Signed)
History and Physical    Madeline [name] ZOX:[id number] DOB: 1925-11-20 DOA: 12/14/2016  Referring MD/NP/PA:   PCP: Corine Shelter, PA-C   Patient coming from:  The patient is coming from home.  At baseline, pt is independent for most of ADL.   Chief Complaint: Shortness of breath  HPI: Madeline [name] is a 83 y.o. female with medical history significant of hypertension, hyperlipidemia, diabetes mellitus, GERD, hypothyroidism, CHF, atrial fibrillation on Coumadin, anemia, CKD-4, on 2 L oxygen at home, who presents with shortness of breath.  Patient states that she has been having shortness of breath in the past 3 days, which has been progressively getting worse. She has increased oxygen requirement. She can speaks in full sentence. Patient states that she started taking Lasix 120 mg twice a day, but her body weight has been increasing, approximately 3 pounds recently. She has cough with white mucus production. Patient states that she had mildly chest pain earlier, which has resolved currently. Patient denies nausea, vomiting, diarrhea, abdominal pain, symptoms of UTI or unilateral weakness. Patient states that she was treated for bronchitis approximately 4 weeks ago, currently no fever or chills.   ED Course: pt was found to have BNP is 385, troponin +0.03, stable renal function, temperature normal, oxygen saturation 88% on room air, negative urinalysis, chest x-ray showed interstitial edema. Patient is placed on telemetry bed for observation.  Review of Systems:   General: no fevers, chills, has weight gain, has poor appetite, has fatigue HEENT: no blurry vision, hearing changes or sore throat Respiratory: has dyspnea, coughing, no wheezing CV: had chest pain, no palpitations GI: no nausea, vomiting, abdominal pain, diarrhea, constipation GU: no dysuria, burning on urination, increased urinary frequency, hematuria  Ext: has mild leg edema Neuro: no unilateral weakness, numbness, or tingling, no  vision change or hearing loss Skin: no rash, no skin tear. MSK: No muscle spasm, no deformity, no limitation of range of movement in spin Heme: No easy bruising.  Travel history: No recent long distant travel.  Allergy: No Known Allergies  Past Medical History:  Diagnosis Date  . Anemia 11/07/2012  . Atrial fibrillation ([place])   . CHF (congestive heart failure) ([place])   . Diabetes mellitus   . Hypertension   . Kidney failure   . Thyroid disease     Past Surgical History:  Procedure Laterality Date  . ABDOMINAL HYSTERECTOMY    . THYROID SURGERY      Social History:  reports that she has never smoked. She has never used smokeless tobacco. She reports that she does not drink alcohol or use drugs.  Family History:  Family History  Problem Relation Age of Onset  . Heart failure Neg Hx   . Heart attack Neg Hx   . Sudden death Neg Hx   . Stroke Neg Hx      Prior to Admission medications   Medication Sig Start Date End Date Taking? Authorizing Provider  acetaminophen (TYLENOL) 500 MG tablet Take 500 mg by mouth every 6 (six) hours as needed for mild pain, moderate pain, fever or headache.    [provider]  amLODipine (NORVASC) 10 MG tablet Take 10 mg by mouth daily.    [provider]  carvedilol (COREG) 6.25 MG tablet Take 6.25 mg by mouth 2 (two) times daily with a meal.    [provider]  cholecalciferol (VITAMIN D) 1000 UNITS tablet Take 1,000 Units by mouth daily.    [provider]  docusate sodium 100 MG  CAPS Take 100 mg by mouth 2 (two) times daily. OTC 11/13/12   Eugenie Filler, MD  doxycycline (VIBRAMYCIN) 100 MG capsule Take 1 capsule (100 mg total) by mouth 2 (two) times daily. One po bid x 7 days 11/20/16   Veryl Speak, MD  ferrous sulfate 325 (65 FE) MG tablet Take 1 tablet (325 mg total) by mouth 3 (three) times daily with meals. 11/13/12   Eugenie Filler, MD  furosemide (LASIX) 40 MG tablet Take 3 tablets (120 mg total) by  mouth 2 (two) times daily. 11/13/12   Eugenie Filler, MD  glimepiride (AMARYL) 4 MG tablet Take 4 mg by mouth 2 (two) times daily.    [provider]  isosorbide mononitrate (IMDUR) 30 MG 24 hr tablet Take 30 mg by mouth daily.    [provider]  levothyroxine (SYNTHROID, LEVOTHROID) 175 MCG tablet Patient takes 1 tablet a day by mouth daily excepet on Sundays she takes 1 1/2 by mouth. 02/27/14   [provider]  losartan (COZAAR) 25 MG tablet Take 25 mg by mouth daily.     [provider]  metolazone (ZAROXOLYN) 2.5 MG tablet TAKE 1 TABLET BY MOUTH TWICE A WEEK 08/03/16   Jerline Pain, MD  mometasone (ELOCON) 0.1 % cream Apply 1 application topically daily.  01/14/14   [provider]  nitroGLYCERIN (NITROSTAT) 0.4 MG SL tablet Place 0.4 mg under the tongue every 5 (five) minutes as needed for chest pain.    [provider]  nystatin-triamcinolone (MYCOLOG II) cream Apply 1 application topically 2 (two) times daily.    [provider]  pantoprazole (PROTONIX) 40 MG tablet Take 40 mg by mouth 2 (two) times daily.    [provider]  potassium chloride SA (K-DUR,KLOR-CON) 20 MEQ tablet Take 2 tablets (40 mEq total) by mouth 2 (two) times daily. 11/13/12   Eugenie Filler, MD  pravastatin (PRAVACHOL) 40 MG tablet Take 40 mg by mouth at bedtime.     [provider]  predniSONE (DELTASONE) 10 MG tablet Take 10 mg by mouth daily as needed (gout flare up).    [provider]  senna (SENOKOT) 8.6 MG TABS tablet Take 2 tablets by mouth daily.    [provider]  silver sulfADIAZINE (SILVADENE) 1 % cream Apply 1 application topically daily.    [provider]  sitaGLIPtin (JANUVIA) 50 MG tablet Take 25 mg by mouth daily. Take half a tablet daily.    [provider]  TOUJEO SOLOSTAR 300 UNIT/ML SOPN 31 Units at bedtime.  08/24/14   [provider]  traMADol (ULTRAM) 50 MG tablet Take  1 tablet (50 mg total) by mouth every 6 (six) hours as needed for pain. 10/05/12   Orson Eva, MD  warfarin (COUMADIN) 3 MG tablet Take as directed by [facility] 11/21/16   Jerline Pain, MD    Physical Exam: Vitals:   12/14/16 1725 12/14/16 1730 12/14/16 1739 12/14/16 1909  BP:  (!) 135/48  (!) 152/65  Pulse:  (!) 59  (!) 59  Resp:  (!) 25  18  Temp: 98.3 F (36.8 C)  99.1 F (37.3 C) 98.5 F (36.9 C)  TempSrc: Oral  Oral Oral  SpO2:  96%  97%  Weight: 89.9 kg (198 lb 2 oz)   87.6 kg (193 lb 1.6 oz)  Height:    5\' 2"  (1.575 m)   General: Not in acute distress HEENT:  Eyes: PERRL, EOMI, no scleral icterus.       ENT: No discharge from the ears and nose, no pharynx injection, no tonsillar enlargement.        Neck: positive JVD, no bruit, no mass felt. Heme: No neck lymph node enlargement. Cardiac: S1/S2, irregularly irregular rhythm, No murmurs, No gallops or rubs. Respiratory: No rales, wheezing, rhonchi or rubs. GI: Soft, nondistended, nontender, no rebound pain, no organomegaly, BS present. GU: No hematuria Ext: has trace leg edema bilaterally. 2+DP/PT pulse bilaterally. Musculoskeletal: No joint deformities, No joint redness or warmth, no limitation of ROM in spin. Skin: No rashes.  Neuro: Alert, oriented X3, cranial nerves II-XII grossly intact, moves all extremities normally. Psych: Patient is not psychotic, no suicidal or hemocidal ideation.  Labs on Admission: I have personally reviewed following labs and imaging studies  CBC:  Recent Labs Lab 12/14/16 1311  WBC 8.9  HGB 10.7*  HCT 32.4*  MCV 97.9  PLT 478   Basic Metabolic Panel:  Recent Labs Lab 12/14/16 1311  NA 137  K 3.8  CL 95*  CO2 29  GLUCOSE 295*  BUN 45*  CREATININE 2.23*  CALCIUM 9.5   GFR: Estimated Creatinine Clearance: 17.6 mL/min (A) (by C-G formula based on SCr of 2.23 mg/dL (H)). Liver Function Tests:  Recent Labs Lab 12/14/16 1311  AST 23  ALT 24  ALKPHOS 90    BILITOT 0.6  PROT 7.4  ALBUMIN 3.5   No results for input(s): LIPASE, AMYLASE in the last 168 hours. No results for input(s): AMMONIA in the last 168 hours. Coagulation Profile: No results for input(s): INR, PROTIME in the last 168 hours. Cardiac Enzymes:  Recent Labs Lab 12/14/16 1311  TROPONINI 0.03*   BNP (last 3 results) No results for input(s): PROBNP in the last 8760 hours. HbA1C: No results for input(s): HGBA1C in the last 72 hours. CBG: No results for input(s): GLUCAP in the last 168 hours. Lipid Profile: No results for input(s): CHOL, HDL, LDLCALC, TRIG, CHOLHDL, LDLDIRECT in the last 72 hours. Thyroid Function Tests: No results for input(s): TSH, T4TOTAL, FREET4, T3FREE, THYROIDAB in the last 72 hours. Anemia Panel: No results for input(s): VITAMINB12, FOLATE, FERRITIN, TIBC, IRON, RETICCTPCT in the last 72 hours. Urine analysis:    Component Value Date/Time   COLORURINE YELLOW 12/14/2016 [place] 12/14/2016 1355   LABSPEC 1.008 12/14/2016 1355   PHURINE 6.5 12/14/2016 1355   GLUCOSEU NEGATIVE 12/14/2016 1355   HGBUR NEGATIVE 12/14/2016 1355   BILIRUBINUR NEGATIVE 12/14/2016 1355   KETONESUR NEGATIVE 12/14/2016 1355   PROTEINUR NEGATIVE 12/14/2016 1355   UROBILINOGEN 0.2 01/22/2015 1433   NITRITE NEGATIVE 12/14/2016 1355   LEUKOCYTESUR NEGATIVE 12/14/2016 1355   Sepsis Labs: @LABRCNTIP (procalcitonin:4,lacticidven:4) )No results found for this or any previous visit (from the past 240 hour(s)).   Radiological Exams on Admission: Dg Chest 2 View  Result Date: 12/14/2016 CLINICAL DATA:  shob x 1 day. H/o CHF, a-fib, previous symptoms in past. EXAM: CHEST - 2 VIEW COMPARISON:  11/20/2016 FINDINGS: Interval increase in mild diffuse interstitial opacities. Some patchy perihilar airspace opacities left worse than right, new since previous study. Heart size upper limits normal.  Atheromatous aorta. No effusion. Anterior vertebral endplate spurring  at multiple levels in the lower thoracic spine. IMPRESSION: 1. Some increase in asymmetric interstitial and airspace edema or infiltrates as above. 2.  Aortic Atherosclerosis (ICD10-170.0) Electronically Signed   By: Lucrezia Europe M.D.   On: 12/14/2016 13:51  EKG: Independently reviewed.  Atrial fibrillation, QTC 459, anteroseptal infarction pattern.  Assessment/Plan Principal Problem:   Acute on chronic diastolic CHF (congestive heart failure) (HCC) Active Problems:   Hypothyroidism   CKD (chronic kidney disease) stage 4, GFR 15-29 ml/min (HCC)   Diabetes mellitus with renal complications (HCC)   Atrial fibrillation (HCC)   Chronic anticoagulation   Essential hypertension   HLD (hyperlipidemia)   GERD (gastroesophageal reflux disease)   Acute on chronic respiratory failure with hypoxia (HCC)   Acute on chronic diastolic (congestive) heart failure (HCC)   Elevated troponin I level   Acute on chronic respiratory failure with hypoxia due to acute on chronic diastolic CHF: Patient has shortness of breath, elevated BNP, weight gain, positive JVD, and interstitial edema on chest x-ray, consistent with CHF exacerbation. No fever or leukocytosis, less likely to have pneumonia or bronchitis. 2-D echo on 01/23/15 showed EF 60%.  -will place on tele bed for obs -Lasix 120 mg bid by IV -trop x 3 -2d echo -will continue home coreg and cozarr -Daily weights -strict I/O's -Low salt diet  Positive troponin: Troponin 0.03. Patient states that she had mild chest pain earlier, which has resolved currently. Likely due to demand ischemia secondary to CHF exacerbation -Troponin 3 -prn NTG - continue coreg and imdur, pravastatin  CKD (chronic kidney disease) stage 4: Stable. Baseline creatinine 2. 0-22. Her creatinine is 2.23, BUN 45 -Follow-up renal function by BMP  Hypothyroidism: Last TSH was 3.626 on 08/03/15 -Continue home Synthroid   Atrial Fibrillation: CHA2DS2-VASc Score is 6, needs  oral anticoagulation. Patient is on Coumadin at home. INR is pending on admission. Heart rate is well controlled. -Continue Coumadin per pharmacy dosing -Continue Coreg  Diabetes mellitus with renal complications: Last K4Y 7.3 on 01/22/15, not well controled. Patient is taking Toujeo , [place] and Amaryl at home -will decrease Toujeo dose from 31 to 25 units daily  -SSI  HTN: -Continue amlodipine, Coreg, Cozaar -on IV lasix as above  HLD:  -Pravastatin  GERD: -Protonix -Pepcid  DVT ppx: on Coumadin Code Status: DNR (I discussed with patient in the presence of her daughter who is the POA, and explained the meaning of CODE STATUS. Patient wants to be DNR) Family Communication: Yes, patient's 2 daughters, grandson at bed side Disposition Plan:  Anticipate discharge back to previous home environment Consults called:  none Admission status: Obs / tele    Date of Service 12/14/2016    Ivor Costa [facility] Pager [phone]  If 7PM-7AM, please contact night-coverage [email or web address] Password [facility] 12/14/2016, 8:18 PM

## 2016-12-15 ENCOUNTER — Observation Stay (HOSPITAL_BASED_OUTPATIENT_CLINIC_OR_DEPARTMENT_OTHER): Payer: Medicare Other

## 2016-12-15 ENCOUNTER — Encounter (HOSPITAL_COMMUNITY): Payer: Self-pay | Admitting: General Practice

## 2016-12-15 DIAGNOSIS — Z66 Do not resuscitate: Secondary | ICD-10-CM | POA: Diagnosis present

## 2016-12-15 DIAGNOSIS — Z794 Long term (current) use of insulin: Secondary | ICD-10-CM | POA: Diagnosis not present

## 2016-12-15 DIAGNOSIS — Z7984 Long term (current) use of oral hypoglycemic drugs: Secondary | ICD-10-CM | POA: Diagnosis not present

## 2016-12-15 DIAGNOSIS — Z9981 Dependence on supplemental oxygen: Secondary | ICD-10-CM | POA: Diagnosis not present

## 2016-12-15 DIAGNOSIS — R748 Abnormal levels of other serum enzymes: Secondary | ICD-10-CM | POA: Diagnosis not present

## 2016-12-15 DIAGNOSIS — Z79899 Other long term (current) drug therapy: Secondary | ICD-10-CM | POA: Diagnosis not present

## 2016-12-15 DIAGNOSIS — I5033 Acute on chronic diastolic (congestive) heart failure: Secondary | ICD-10-CM | POA: Diagnosis not present

## 2016-12-15 DIAGNOSIS — Z90710 Acquired absence of both cervix and uterus: Secondary | ICD-10-CM | POA: Diagnosis not present

## 2016-12-15 DIAGNOSIS — E876 Hypokalemia: Secondary | ICD-10-CM | POA: Diagnosis present

## 2016-12-15 DIAGNOSIS — D631 Anemia in chronic kidney disease: Secondary | ICD-10-CM | POA: Diagnosis present

## 2016-12-15 DIAGNOSIS — N189 Chronic kidney disease, unspecified: Secondary | ICD-10-CM | POA: Diagnosis not present

## 2016-12-15 DIAGNOSIS — I10 Essential (primary) hypertension: Secondary | ICD-10-CM | POA: Diagnosis not present

## 2016-12-15 DIAGNOSIS — Z7901 Long term (current) use of anticoagulants: Secondary | ICD-10-CM | POA: Diagnosis not present

## 2016-12-15 DIAGNOSIS — E785 Hyperlipidemia, unspecified: Secondary | ICD-10-CM | POA: Diagnosis not present

## 2016-12-15 DIAGNOSIS — K219 Gastro-esophageal reflux disease without esophagitis: Secondary | ICD-10-CM | POA: Diagnosis present

## 2016-12-15 DIAGNOSIS — E039 Hypothyroidism, unspecified: Secondary | ICD-10-CM | POA: Diagnosis not present

## 2016-12-15 DIAGNOSIS — I361 Nonrheumatic tricuspid (valve) insufficiency: Secondary | ICD-10-CM

## 2016-12-15 DIAGNOSIS — I482 Chronic atrial fibrillation: Secondary | ICD-10-CM | POA: Diagnosis not present

## 2016-12-15 DIAGNOSIS — I4891 Unspecified atrial fibrillation: Secondary | ICD-10-CM | POA: Diagnosis present

## 2016-12-15 DIAGNOSIS — N184 Chronic kidney disease, stage 4 (severe): Secondary | ICD-10-CM | POA: Diagnosis not present

## 2016-12-15 DIAGNOSIS — R0602 Shortness of breath: Secondary | ICD-10-CM | POA: Diagnosis not present

## 2016-12-15 DIAGNOSIS — E1122 Type 2 diabetes mellitus with diabetic chronic kidney disease: Secondary | ICD-10-CM | POA: Diagnosis not present

## 2016-12-15 DIAGNOSIS — I248 Other forms of acute ischemic heart disease: Secondary | ICD-10-CM | POA: Diagnosis present

## 2016-12-15 DIAGNOSIS — J9621 Acute and chronic respiratory failure with hypoxia: Secondary | ICD-10-CM | POA: Diagnosis present

## 2016-12-15 DIAGNOSIS — I130 Hypertensive heart and chronic kidney disease with heart failure and stage 1 through stage 4 chronic kidney disease, or unspecified chronic kidney disease: Secondary | ICD-10-CM | POA: Diagnosis present

## 2016-12-15 LAB — CBC
HEMATOCRIT: 29.9 % — AB (ref 36.0–46.0)
HEMOGLOBIN: 9.9 g/dL — AB (ref 12.0–15.0)
MCH: 31.5 pg (ref 26.0–34.0)
MCHC: 33.1 g/dL (ref 30.0–36.0)
MCV: 95.2 fL (ref 78.0–100.0)
Platelets: 238 10*3/uL (ref 150–400)
RBC: 3.14 MIL/uL — AB (ref 3.87–5.11)
RDW: 16.3 % — ABNORMAL HIGH (ref 11.5–15.5)
WBC: 9.4 10*3/uL (ref 4.0–10.5)

## 2016-12-15 LAB — PROTIME-INR
INR: 2.33
Prothrombin Time: 25.9 seconds — ABNORMAL HIGH (ref 11.4–15.2)

## 2016-12-15 LAB — BASIC METABOLIC PANEL
ANION GAP: 11 (ref 5–15)
BUN: 43 mg/dL — AB (ref 6–20)
CO2: 32 mmol/L (ref 22–32)
Calcium: 9.4 mg/dL (ref 8.9–10.3)
Chloride: 95 mmol/L — ABNORMAL LOW (ref 101–111)
Creatinine, Ser: 2.23 mg/dL — ABNORMAL HIGH (ref 0.44–1.00)
GFR calc Af Amer: 21 mL/min — ABNORMAL LOW (ref >60)
GFR, EST NON AFRICAN AMERICAN: 18 mL/min — AB (ref >60)
Glucose, Bld: 179 mg/dL — ABNORMAL HIGH (ref 65–99)
POTASSIUM: 3.4 mmol/L — AB (ref 3.5–5.1)
SODIUM: 138 mmol/L (ref 135–145)

## 2016-12-15 LAB — GLUCOSE, CAPILLARY
GLUCOSE-CAPILLARY: 188 mg/dL — AB (ref 65–99)
GLUCOSE-CAPILLARY: 187 mg/dL — AB (ref 65–99)
GLUCOSE-CAPILLARY: 215 mg/dL — AB (ref 65–99)
Glucose-Capillary: 180 mg/dL — ABNORMAL HIGH (ref 65–99)

## 2016-12-15 LAB — TROPONIN I
TROPONIN I: 0.03 ng/mL — AB (ref <0.03)
TROPONIN I: 0.03 ng/mL — AB (ref <0.03)

## 2016-12-15 LAB — ECHOCARDIOGRAM COMPLETE
Height: 62 in
WEIGHTICAEL: 3180.8 [oz_av]

## 2016-12-15 MED ORDER — BLUE-EMU SUPER STRENGTH EX CREA: TOPICAL_CREAM | CUTANEOUS | Status: DC | PRN

## 2016-12-15 MED ORDER — INSULIN ASPART 100 UNIT/ML ~~LOC~~ SOLN: 0.0000 [IU] | Freq: Every day | SUBCUTANEOUS | Status: DC

## 2016-12-15 MED ORDER — WARFARIN SODIUM 3 MG PO TABS
6.0000 mg | ORAL_TABLET | Freq: Once | ORAL | Status: AC
  Administered 2016-12-15: 6 mg via ORAL
  Filled 2016-12-15: qty 2

## 2016-12-15 MED ORDER — POTASSIUM CHLORIDE CRYS ER 20 MEQ PO TBCR
40.0000 meq | EXTENDED_RELEASE_TABLET | Freq: Once | ORAL | Status: AC
  Administered 2016-12-15: 40 meq via ORAL
  Filled 2016-12-15: qty 2

## 2016-12-15 MED ORDER — FAMOTIDINE 10 MG PO TABS
10.0000 mg | ORAL_TABLET | Freq: Two times a day (BID) | ORAL | Status: DC | PRN
  Filled 2016-12-15: qty 1

## 2016-12-15 MED ORDER — FUROSEMIDE 10 MG/ML IJ SOLN
80.0000 mg | Freq: Two times a day (BID) | INTRAMUSCULAR | Status: DC
  Administered 2016-12-15: 80 mg via INTRAVENOUS
  Filled 2016-12-15: qty 8

## 2016-12-15 MED ORDER — INSULIN ASPART 100 UNIT/ML ~~LOC~~ SOLN: 0.0000 [IU] | Freq: Three times a day (TID) | SUBCUTANEOUS | Status: DC

## 2016-12-15 MED ORDER — MAGNESIUM OXIDE 400 (241.3 MG) MG PO TABS
400.0000 mg | ORAL_TABLET | Freq: Every day | ORAL | Status: DC
  Administered 2016-12-15 – 2016-12-16 (×2): 400 mg via ORAL
  Filled 2016-12-15 (×2): qty 1

## 2016-12-15 NOTE — Care Management Note (Signed)
Case Management Note  Patient Details  Name: Madeline [name] MRN: [id number] Date of Birth: 07-Jun-1925  Subjective/Objective:    CM following for progression and d/c planning.                 Action/Plan: 12/15/16 Noted that pt plan is to return to home with daughter, will follow for d/c needs. CM referral for CHF program, await orders.   Expected Discharge Date:                  Expected Discharge Plan:  [place]  In-House Referral:  NA  Discharge planning Services  CM Consult  Post Acute Care Choice:    Choice offered to:     DME Arranged:    DME Agency:     HH Arranged:    HH Agency:     Status of Service:  In process, will continue to follow  If discussed at Long Length of Stay Meetings, dates discussed:    Additional Comments:  Adron Bene, RN 12/15/2016, 10:49 AM

## 2016-12-15 NOTE — Progress Notes (Signed)
Patient without complaint during 7 a to 7 p shift, VSS.  Patient up and down to [facility] multiple times during the shift with standby assist.  Daughters at bedside.  Continues to diurese.

## 2016-12-15 NOTE — Progress Notes (Signed)
PROGRESS NOTE    Madeline [name]  ZDG:[id number] DOB: 27-Feb-1926 DOA: 12/14/2016 PCP: Corine Shelter, PA-C   Outpatient Specialists:     Brief Narrative:  Madeline [name] is a 83 y.o. female with medical history significant of hypertension, hyperlipidemia, diabetes mellitus, GERD, hypothyroidism, CHF, atrial fibrillation on Coumadin, anemia, CKD-4, on 2 L oxygen at home, who presents with shortness of breath.  Patient states that she has been having shortness of breath in the past 3 days, which has been progressively getting worse. She has increased oxygen requirement. She can speaks in full sentence. Patient states that she started taking Lasix 120 mg twice a day, but her body weight has been increasing, approximately 3 pounds recently. She has cough with white mucus production. Patient states that she had mildly chest pain earlier, which has resolved currently. Patient denies nausea, vomiting, diarrhea, abdominal pain, symptoms of UTI or unilateral weakness. Patient states that she was treated for bronchitis approximately 4 weeks ago, currently no fever or chills.    Assessment & Plan:   Principal Problem:   Acute on chronic diastolic CHF (congestive heart failure) (HCC) Active Problems:   Hypothyroidism   CKD (chronic kidney disease) stage 4, GFR 15-29 ml/min (HCC)   Diabetes mellitus with renal complications (HCC)   Atrial fibrillation (HCC)   Chronic anticoagulation   Essential hypertension   HLD (hyperlipidemia)   GERD (gastroesophageal reflux disease)   Acute on chronic respiratory failure with hypoxia (HCC)   Acute on chronic diastolic (congestive) heart failure (HCC)   Elevated troponin I level   Acute on chronic respiratory failure with hypoxia due to acute on chronic diastolic CHF:  - 2-D echo on 01/23/15 showed EF 60%.- repeat echo pending -Lasix IV -I/o: down 1L thus far -daily weight -will continue home coreg and cozarr -Low salt diet  Positive troponin:  -flat  troponin- suspect from volume overload  CKD (chronic kidney disease) stage 4:  -Baseline creatinine 2-2.1 -watch closely as diuresing  Hypothyroidism: Last TSH was 3.626 on 08/03/15 -Continue home Synthroid   Atrial Fibrillation: CHA2DS2-VASc Score is 6, needs oral anticoagulation. Patient is on Coumadin at home -Continue Coumadin per pharmacy dosing -Continue Coreg  Diabetes mellitus with renal complications: Last V9D 7.3 on 01/22/15, not well controled. Patient is taking Toujeo , [place] and Amaryl at home -will decrease Toujeo dose from 31 to 25 units daily  -SSI  HTN: -Continue amlodipine, Coreg, Cozaar -IV lasix  HLD:  -Pravastatin  GERD: -Protonix -Pepcid   DVT prophylaxis:  coumadin  Code Status: DNR   Family Communication: daughter  Disposition Plan:     Consultants:    Subjective: Breathing better -getting echo  Objective: Vitals:   12/15/16 0742 12/15/16 0915 12/15/16 0918 12/15/16 1129  BP: (!) 138/43 (!) 138/36 (!) 126/42 (!) 149/47  Pulse: (!) 58 (!) 59  (!) 58  Resp:      Temp: 98.6 F (37 C)   98.4 F (36.9 C)  TempSrc: Oral   Oral  SpO2: 97% 96%  90%  Weight:      Height:        Intake/Output Summary (Last 24 hours) at 12/15/16 1219 Last data filed at 12/15/16 1133  Gross per 24 hour  Intake              302 ml  Output             1675 ml  Net            -1373 ml  Filed Weights   12/14/16 1725 12/14/16 1909 12/15/16 0635  Weight: 89.9 kg (198 lb 2 oz) 87.6 kg (193 lb 1.6 oz) 90.2 kg (198 lb 12.8 oz)    Examination:  General exam: Appears calm and comfortable  Respiratory system: Clear to auscultation anterior. Respiratory effort normal. Cardiovascular system: S1 & S2 heard, RRR. No JVD, murmurs, rubs, gallops or clicks. +edema Gastrointestinal system: Abdomen is nondistended, soft and nontender. No organomegaly or masses felt. Normal bowel sounds heard. Central nervous system: A+Ox3, NAD Extremities: Symmetric 5 x  5 power. Skin: No rashes, lesions or ulcers Psychiatry: Judgement and insight appear normal. Mood & affect appropriate.     Data Reviewed: I have personally reviewed following labs and imaging studies  CBC:  Recent Labs Lab 12/14/16 1311 12/15/16 0458  WBC 8.9 9.4  HGB 10.7* 9.9*  HCT 32.4* 29.9*  MCV 97.9 95.2  PLT 240 419   Basic Metabolic Panel:  Recent Labs Lab 12/14/16 1311 12/15/16 0659  NA 137 138  K 3.8 3.4*  CL 95* 95*  CO2 29 32  GLUCOSE 295* 179*  BUN 45* 43*  CREATININE 2.23* 2.23*  CALCIUM 9.5 9.4   GFR: Estimated Creatinine Clearance: 17.8 mL/min (A) (by C-G formula based on SCr of 2.23 mg/dL (H)). Liver Function Tests:  Recent Labs Lab 12/14/16 1311  AST 23  ALT 24  ALKPHOS 90  BILITOT 0.6  PROT 7.4  ALBUMIN 3.5   No results for input(s): LIPASE, AMYLASE in the last 168 hours. No results for input(s): AMMONIA in the last 168 hours. Coagulation Profile:  Recent Labs Lab 12/14/16 2128 12/15/16 0458  INR 2.73 2.33   Cardiac Enzymes:  Recent Labs Lab 12/14/16 1311 12/14/16 2128 12/15/16 0214 12/15/16 0659  TROPONINI 0.03* 0.03* 0.03* 0.03*   BNP (last 3 results) No results for input(s): PROBNP in the last 8760 hours. HbA1C: No results for input(s): HGBA1C in the last 72 hours. CBG:  Recent Labs Lab 12/14/16 2123 12/15/16 0741 12/15/16 1126  GLUCAP 185* 180* 188*   Lipid Profile: No results for input(s): CHOL, HDL, LDLCALC, TRIG, CHOLHDL, LDLDIRECT in the last 72 hours. Thyroid Function Tests: No results for input(s): TSH, T4TOTAL, FREET4, T3FREE, THYROIDAB in the last 72 hours. Anemia Panel: No results for input(s): VITAMINB12, FOLATE, FERRITIN, TIBC, IRON, RETICCTPCT in the last 72 hours. Urine analysis:    Component Value Date/Time   COLORURINE YELLOW 12/14/2016 [place] 12/14/2016 1355   LABSPEC 1.008 12/14/2016 1355   PHURINE 6.5 12/14/2016 1355   GLUCOSEU NEGATIVE 12/14/2016 1355   HGBUR  NEGATIVE 12/14/2016 1355   [place] 12/14/2016 1355   KETONESUR NEGATIVE 12/14/2016 1355   PROTEINUR NEGATIVE 12/14/2016 1355   UROBILINOGEN 0.2 01/22/2015 1433   NITRITE NEGATIVE 12/14/2016 1355   LEUKOCYTESUR NEGATIVE 12/14/2016 1355     )No results found for this or any previous visit (from the past 240 hour(s)).    Anti-infectives    None       Radiology Studies: Dg Chest 2 View  Result Date: 12/14/2016 CLINICAL DATA:  shob x 1 day. H/o CHF, a-fib, previous symptoms in past. EXAM: CHEST - 2 VIEW COMPARISON:  11/20/2016 FINDINGS: Interval increase in mild diffuse interstitial opacities. Some patchy perihilar airspace opacities left worse than right, new since previous study. Heart size upper limits normal.  Atheromatous aorta. No effusion. Anterior vertebral endplate spurring at multiple levels in the lower thoracic spine. IMPRESSION: 1. Some increase in asymmetric interstitial and airspace edema  or infiltrates as above. 2.  Aortic Atherosclerosis (ICD10-170.0) Electronically Signed   By: Lucrezia Europe M.D.   On: 12/14/2016 13:51        Scheduled Meds: . amLODipine  10 mg Oral Daily  . carvedilol  6.25 mg Oral BID WC  . cholecalciferol  1,000 Units Oral Daily  . docusate sodium  100 mg Oral BID  . ferrous sulfate  325 mg Oral TID WC  . insulin glargine  25 Units Subcutaneous QHS  . isosorbide mononitrate  30 mg Oral Daily  . levothyroxine  175 mcg Oral QAC breakfast  . losartan  25 mg Oral Daily  . magnesium oxide  400 mg Oral Daily  . pantoprazole  40 mg Oral BID  . pravastatin  40 mg Oral QHS  . senna  2 tablet Oral Daily  . silver sulfADIAZINE  1 application Topical Daily  . sodium chloride flush  3 mL Intravenous Q12H  . triamcinolone cream  1 application Topical Daily  . Warfarin - Pharmacist Dosing Inpatient   Does not apply q1800   Continuous Infusions: . sodium chloride    . furosemide Stopped (12/15/16 1030)     LOS: 0 days    Time spent: 25  min    [place], DO [facility] Pager [phone]  If 7PM-7AM, please contact night-coverage [email or web address] Password [email or web address] 12/15/2016, 12:19 PM

## 2016-12-15 NOTE — Care Management Obs Status (Signed)
[place] NOTIFICATION   Patient Details  Name: Madeline [name] MRN: [id number] Date of Birth: 1926-02-09   Medicare Observation Status Notification Given:  Yes    Doretta Remmert, Rory Percy, RN 12/15/2016, 3:55 PM

## 2016-12-15 NOTE — Progress Notes (Signed)
ANTICOAGULATION CONSULT NOTE - Follow Up Consult  Pharmacy Consult for Coumadin Indication: atrial fibrillation  No Known Allergies  Patient Measurements: Height: 5\' 2"  (157.5 cm) Weight: 198 lb 12.8 oz (90.2 kg) IBW/kg (Calculated) : 50.1  Vital Signs: Temp: 98.4 F (36.9 C) (08/09 1129) Temp Source: Oral (08/09 1129) BP: 149/47 (08/09 1129) Pulse Rate: 58 (08/09 1129)  Labs:  Recent Labs  12/14/16 1311 12/14/16 2128 12/15/16 0214 12/15/16 0458 12/15/16 0659  HGB 10.7*  --   --  9.9*  --   HCT 32.4*  --   --  29.9*  --   PLT 240  --   --  238  --   LABPROT  --  29.5*  --  25.9*  --   INR  --  2.73  --  2.33  --   CREATININE 2.23*  --   --   --  2.23*  TROPONINI 0.03* 0.03* 0.03*  --  0.03*    Estimated Creatinine Clearance: 17.8 mL/min (A) (by C-G formula based on SCr of 2.23 mg/dL (H)).  Assessment:  83 yr old female on Coumadin for atrial fibrillation.  INR remains therapeutic (2.33).  Down from 2.73 yesterday; received Coumadin on 8/8 pm, half of usual dose.    Home Coumadin regimen: 6 mg daily except 3 mg on Mondays. Uses 3 mg tablets.   Daughter reports that they check INR every 2 weeks with home monitor.  Goal of Therapy:  INR 2-3 Monitor platelets by anticoagulation protocol: Yes   Plan:   Coumadin 6 mg today, usual dose.  Daily PT/INR for now.  Arty Baumgartner, [place] Pager: [phone] 12/15/2016,12:22 PM

## 2016-12-15 NOTE — Progress Notes (Signed)
Per daughter the family does not want her to have short acting insulin while here in the hospital as in the past this has dropped her too low.  Is ok with her receiving the Lantus.

## 2016-12-15 NOTE — Consult Note (Signed)
   [facility] CM Inpatient Consult   12/15/2016  Su Duma 30-Jan-1926 [id number]    [facility] Care Management referral received.   Spoke with Mrs. Gombert and daughter, Randell Patient, at bedside to discuss [facility] Management referral.  Randell Patient (daughter) reports that Mrs. Central lives with her. They weigh daily and know when to contact the MD. Denies any transportation, disease management, or medications needs.  Discussed that Mrs. Pickron's [facility] Provider's office provides transition of care calls post discharge.  [name] declines having any [facility] Care Management needs at this time. Also declines the need for [facility] CHF calls.  [facility] Care Management brochure and 24-hr nurse line magnet provided.  Appreciative of visit.   Made inpatient RNCM aware.  Marthenia Rolling, MSN-Ed, RN,BSN [facility] Liaison [phone]

## 2016-12-15 NOTE — Plan of Care (Signed)
Problem: Pain Managment: Goal: General experience of comfort will improve Outcome: Progressing Denies any CP, SOB improving   Problem: Tissue Perfusion: Goal: Risk factors for ineffective tissue perfusion will decrease Outcome: Progressing Uses O2 at night at baseline  Problem: Bowel/Gastric: Goal: Will not experience complications related to bowel motility Outcome: Progressing Receiving senokot and colace as she does at home

## 2016-12-15 NOTE — Progress Notes (Signed)
*  PRELIMINARY RESULTS* Echocardiogram 2D Echocardiogram has been performed.  Leavy Cella 12/15/2016, 10:24 AM

## 2016-12-16 ENCOUNTER — Telehealth: Payer: Self-pay | Admitting: Pharmacist

## 2016-12-16 DIAGNOSIS — E039 Hypothyroidism, unspecified: Secondary | ICD-10-CM

## 2016-12-16 DIAGNOSIS — I10 Essential (primary) hypertension: Secondary | ICD-10-CM

## 2016-12-16 DIAGNOSIS — Z794 Long term (current) use of insulin: Secondary | ICD-10-CM

## 2016-12-16 DIAGNOSIS — I5033 Acute on chronic diastolic (congestive) heart failure: Secondary | ICD-10-CM

## 2016-12-16 DIAGNOSIS — I482 Chronic atrial fibrillation: Secondary | ICD-10-CM

## 2016-12-16 DIAGNOSIS — R748 Abnormal levels of other serum enzymes: Secondary | ICD-10-CM

## 2016-12-16 DIAGNOSIS — E1122 Type 2 diabetes mellitus with diabetic chronic kidney disease: Secondary | ICD-10-CM

## 2016-12-16 DIAGNOSIS — N184 Chronic kidney disease, stage 4 (severe): Secondary | ICD-10-CM

## 2016-12-16 DIAGNOSIS — E785 Hyperlipidemia, unspecified: Secondary | ICD-10-CM

## 2016-12-16 DIAGNOSIS — N189 Chronic kidney disease, unspecified: Secondary | ICD-10-CM

## 2016-12-16 LAB — BASIC METABOLIC PANEL
ANION GAP: 14 (ref 5–15)
BUN: 50 mg/dL — AB (ref 6–20)
CALCIUM: 9.6 mg/dL (ref 8.9–10.3)
CHLORIDE: 94 mmol/L — AB (ref 101–111)
CO2: 30 mmol/L (ref 22–32)
CREATININE: 2.56 mg/dL — AB (ref 0.44–1.00)
GFR calc Af Amer: 18 mL/min — ABNORMAL LOW (ref >60)
GFR calc non Af Amer: 16 mL/min — ABNORMAL LOW (ref >60)
GLUCOSE: 127 mg/dL — AB (ref 65–99)
Potassium: 3.3 mmol/L — ABNORMAL LOW (ref 3.5–5.1)
SODIUM: 138 mmol/L (ref 135–145)

## 2016-12-16 LAB — PROTIME-INR
INR: 2.04
Prothrombin Time: 23.4 seconds — ABNORMAL HIGH (ref 11.4–15.2)

## 2016-12-16 LAB — GLUCOSE, CAPILLARY
GLUCOSE-CAPILLARY: 127 mg/dL — AB (ref 65–99)
Glucose-Capillary: 212 mg/dL — ABNORMAL HIGH (ref 65–99)

## 2016-12-16 MED ORDER — FUROSEMIDE 10 MG/ML IJ SOLN
80.0000 mg | Freq: Two times a day (BID) | INTRAMUSCULAR | Status: DC
Start: 2016-12-16 — End: 2016-12-16
  Administered 2016-12-16: 80 mg via INTRAVENOUS
  Filled 2016-12-16: qty 8

## 2016-12-16 MED ORDER — INSULIN ASPART 100 UNIT/ML ~~LOC~~ SOLN
0.0000 [IU] | Freq: Three times a day (TID) | SUBCUTANEOUS | Status: DC
Start: 2016-12-16 — End: 2016-12-16

## 2016-12-16 MED ORDER — FERROUS SULFATE 325 (65 FE) MG PO TABS
325.0000 mg | ORAL_TABLET | Freq: Three times a day (TID) | ORAL | Status: DC
  Administered 2016-12-16 (×2): 325 mg via ORAL
  Filled 2016-12-16 (×2): qty 1

## 2016-12-16 MED ORDER — CARVEDILOL 6.25 MG PO TABS
6.2500 mg | ORAL_TABLET | Freq: Two times a day (BID) | ORAL | Status: DC
  Administered 2016-12-16: 6.25 mg via ORAL
  Filled 2016-12-16: qty 1

## 2016-12-16 MED ORDER — POTASSIUM CHLORIDE CRYS ER 20 MEQ PO TBCR
40.0000 meq | EXTENDED_RELEASE_TABLET | Freq: Once | ORAL | Status: AC
  Administered 2016-12-16: 40 meq via ORAL
  Filled 2016-12-16: qty 2

## 2016-12-16 MED ORDER — WARFARIN SODIUM 3 MG PO TABS: 6.0000 mg | ORAL_TABLET | Freq: Once | ORAL | Status: DC

## 2016-12-16 NOTE — Plan of Care (Signed)
Problem: Education: Goal: Ability to verbalize understanding of medication therapies will improve Outcome: Progressing Discussed progression from IVPB, IV push, to PO diuretics with patient and daughter.

## 2016-12-16 NOTE — Progress Notes (Signed)
ANTICOAGULATION CONSULT NOTE - Follow Up Consult  Pharmacy Consult for Coumadin Indication: atrial fibrillation  No Known Allergies  Patient Measurements: Height: 5\' 2"  (157.5 cm) Weight: 198 lb (89.8 kg) IBW/kg (Calculated) : 50.1  Vital Signs: Temp: 98.9 F (37.2 C) (08/10 1227) Temp Source: Oral (08/10 1227) BP: 118/63 (08/10 1227) Pulse Rate: 58 (08/10 1227)  Labs:  Recent Labs  12/14/16 1311 12/14/16 2128 12/15/16 0214 12/15/16 0458 12/15/16 0659 12/16/16 0308  HGB 10.7*  --   --  9.9*  --   --   HCT 32.4*  --   --  29.9*  --   --   PLT 240  --   --  238  --   --   LABPROT  --  29.5*  --  25.9*  --  23.4*  INR  --  2.73  --  2.33  --  2.04  CREATININE 2.23*  --   --   --  2.23* 2.56*  TROPONINI 0.03* 0.03* 0.03*  --  0.03*  --     Estimated Creatinine Clearance: 15.5 mL/min (A) (by C-G formula based on SCr of 2.56 mg/dL (H)).  Assessment:  83 yr old female on Coumadin for atrial fibrillation.  INR remains therapeutic (2.04).  INR has trended down from 2.73 on admit date 12/14/16. The patient received Coumadin 3mg  (half of usual dose) on 8/8 PM and 6mg  last night.    Home Coumadin regimen: 6 mg daily except 3 mg on Mondays. Uses 3 mg tablets.   Daughter reports that they check INR every 2 weeks with home monitor.  Goal of Therapy:  INR 2-3 Monitor platelets by anticoagulation protocol: Yes   Plan:   Coumadin 6 mg today (usual dose) if patient remains in hospital.  Discharge planned for today.   Daily PT/INR if remains inpatient.  Nicole Cella, RPh Clinical Pharmacist Pager: [phone] 12/16/2016,12:58 PM

## 2016-12-16 NOTE — Telephone Encounter (Signed)
Pt daughter called to report that she will be discharged from hospital today. She was instructed to resume her usual dose of warfarin. No new medications. She will recheck INR on Friday via home monitor.

## 2016-12-16 NOTE — Evaluation (Signed)
Physical Therapy Evaluation Patient Details Name: Madeline [name] MRN: [id number] DOB: Sep 09, 1925 Today's Date: 12/16/2016   History of Present Illness  Pt is an 83 y/o female admitted secondary to SOB, found to be in acute on chronic respiratory failure with hypoxia secondary to acute on chronic HF. PMH including but not limited to a-fib, CHF, CKD, DM and HTN.  Clinical Impression  Pt presented supine in bed with HOB elevated, awake and willing to participate in therapy session. Pt's daughter present throughout session as well. Prior to admission, pt reported that she ambulated with use of rollator and occasionally required assistance from family with ADLs. Pt ambulated within room with use of RW and min guard, on 2L of supplemental O2 with SPO2 maintaining at 98%. Pt would continue to benefit from skilled physical therapy services at this time while admitted and after d/c to address the below listed limitations in order to improve overall safety and independence with functional mobility.     Follow Up Recommendations Home health PT;Supervision/Assistance - 24 hour;Other (comment) (pt refusing HHPT at this time)    Equipment Recommendations  None recommended by PT    Recommendations for Other Services       Precautions / Restrictions Restrictions Weight Bearing Restrictions: No      Mobility  Bed Mobility Overal bed mobility: Needs Assistance Bed Mobility: Supine to Sit     Supine to sit: Supervision     General bed mobility comments: increased time and effort, use of bed rails  Transfers Overall transfer level: Needs assistance Equipment used: Rolling walker (2 wheeled) Transfers: Sit to/from Stand Sit to Stand: Min guard         General transfer comment: increased time, good technique, min guard for safety  Ambulation/Gait Ambulation/Gait assistance: Min guard Ambulation Distance (Feet): 40 Feet Assistive device: Rolling walker (2 wheeled) Gait Pattern/deviations:  Step-through pattern;Decreased stride length;Trunk flexed Gait velocity: decreased   General Gait Details: mild instability but no overt LOB or need for physical assistance, min guard for safety and cueing to maintain a safe distance from RW. pt ambulated on 2L of O2 with SPO2 maintaining at 98% throughout  Stairs            Wheelchair Mobility    Modified Rankin (Stroke Patients Only)       Balance Overall balance assessment: Needs assistance Sitting-balance support: Feet supported Sitting balance-Leahy Scale: Good     Standing balance support: During functional activity;Bilateral upper extremity supported Standing balance-Leahy Scale: Poor Standing balance comment: pt reliant on bilateral UEs on RW                             Pertinent Vitals/Pain Pain Assessment: No/denies pain    Home Living Family/patient expects to be discharged to:: Private residence Living Arrangements: Children Available Help at Discharge: Family;Available 24 hours/day Type of Home: House Home Access: Stairs to enter Entrance Stairs-Rails: Psychiatric nurse of Steps: 2 Home Layout: One level Home Equipment: Shower seat;Grab bars - tub/shower;Walker - 4 wheels;Bedside commode;Wheelchair - manual;Hospital bed      Prior Function Level of Independence: Needs assistance   Gait / Transfers Assistance Needed: pt ambulates with rollator  ADL's / Homemaking Assistance Needed: needs assistance with dressing and bathing occasionally        Hand Dominance        Extremity/Trunk Assessment   Upper Extremity Assessment Upper Extremity Assessment: Overall WFL for tasks assessed  Lower Extremity Assessment Lower Extremity Assessment: Overall WFL for tasks assessed       Communication   Communication: No difficulties  Cognition Arousal/Alertness: Awake/alert Behavior During Therapy: WFL for tasks assessed/performed Overall Cognitive Status: Within  Functional Limits for tasks assessed                                        General Comments      Exercises     Assessment/Plan    PT Assessment Patient needs continued PT services  PT Problem List Decreased balance;Decreased mobility;Decreased coordination;Decreased knowledge of use of DME;Decreased safety awareness;Cardiopulmonary status limiting activity       PT Treatment Interventions DME instruction;Gait training;Stair training;Functional mobility training;Therapeutic exercise;Therapeutic activities;Neuromuscular re-education;Balance training;Patient/family education    PT Goals (Current goals can be found in the Care Plan section)  Acute Rehab PT Goals Patient Stated Goal: return home today PT Goal Formulation: With patient/family Time For Goal Achievement: 12/30/16 Potential to Achieve Goals: Good    Frequency Min 3X/week   Barriers to discharge        Co-evaluation               AM-PAC PT "6 Clicks" Daily Activity  Outcome Measure Difficulty turning over in bed (including adjusting bedclothes, sheets and blankets)?: None Difficulty moving from lying on back to sitting on the side of the bed? : A Little Difficulty sitting down on and standing up from a chair with arms (e.g., wheelchair, bedside commode, etc,.)?: Total Help needed moving to and from a bed to chair (including a wheelchair)?: A Little Help needed walking in hospital room?: A Little Help needed climbing 3-5 steps with a railing? : A Little 6 Click Score: 17    End of Session Equipment Utilized During Treatment: Oxygen Activity Tolerance: Patient tolerated treatment well Patient left: in bed;with call bell/phone within reach;with family/visitor present;Other (comment) (sitting EOB) Nurse Communication: Mobility status PT Visit Diagnosis: Other abnormalities of gait and mobility (R26.89)    Time: 9758-8325 PT Time Calculation (min) (ACUTE ONLY): 20 min   Charges:   PT  Evaluation $PT Eval Moderate Complexity: 1 Mod     PT G Codes:        [place], PT, DPT [place] 12/16/2016, 10:03 AM

## 2016-12-16 NOTE — Discharge Summary (Signed)
Physician Discharge Summary  Madeline [name] IZT:[id number] DOB: 1925/10/31 DOA: 12/14/2016  PCP: Corine Shelter, PA-C  Admit date: 12/14/2016 Discharge date: 12/16/2016  Time spent: 40 minutes  Recommendations for Outpatient Follow-up:  1. Follow-up cardiology in 2 weeks  2. PT recommended home health PT but patient is refusing home health physical therapy at this time.   Discharge Diagnoses:  Principal Problem:   Acute on chronic diastolic CHF (congestive heart failure) (HCC) Active Problems:   Hypothyroidism   CKD (chronic kidney disease) stage 4, GFR 15-29 ml/min (HCC)   Diabetes mellitus with renal complications (HCC)   Atrial fibrillation (HCC)   Chronic anticoagulation   Essential hypertension   HLD (hyperlipidemia)   GERD (gastroesophageal reflux disease)   Acute on chronic respiratory failure with hypoxia (HCC)   Acute on chronic diastolic (congestive) heart failure (HCC)   Elevated troponin I level   Discharge Condition: Stable  Diet recommendation: Heart healthy diet  Filed Weights   12/14/16 1909 12/15/16 0635 12/16/16 0308  Weight: 87.6 kg (193 lb 1.6 oz) 90.2 kg (198 lb 12.8 oz) 89.8 kg (198 lb)    History of present illness:  83 y.o.femalewith medical history significant of hypertension, hyperlipidemia, diabetes mellitus, GERD, hypothyroidism, CHF, atrial fibrillation on Coumadin, anemia, CKD-4, on 2 L oxygen at home, who presents with shortness of breath.  Patient states that she has been having shortness of breath in the past 3 days, which has been progressively getting worse. She has increased oxygen requirement. She can speaks in full sentence. Patient states that she started taking Lasix 120 mg twice a day, but her body weight has been increasing, approximately 3 pounds recently. She has cough with white mucus production. Patient states that she had mildly chest pain earlier, which has resolved currently. Patient denies nausea, vomiting, diarrhea, abdominal  pain, symptoms of UTI or unilateral weakness. Patient states that she was treated for bronchitis approximately 4 weeks ago, currently no fever or chills.   Hospital Course:   Acute on chronic respiratory failure with hypoxia-resolved, secondary to acute on chronic diastolic CHF, patient was started on high-dose Lasix 80 mg IV every 12 hours. At this time breathing has improved. She is back to baseline. Net -2.8 liters. We'll discharged on home dose of Lasix 120 mg by mouth twice a day. Patient will follow-up with cardiology in 2 weeks. She has an appointment with Dr. Marlou Porch. Continue Coreg, Cozaar  CK D stage IV- patient's baseline creatinine around 2.2, crit creatinine is 2.56. Will discontinue IV Lasix and patient will be discharged on by mouth Lasix as above.  Hypokalemia-potassium is 3.3 today, will replace potassium K-Dur 40 mg by mouth 1. Patient to continue with the potassium supplementation at home.  Hypothyroidism-continue Synthroid  Troponin elevation- troponin remained 0.03 during hospital stay, likely from demand ischemia. No chest pain.  Atrial fibrillation- CHA2DS2VASc score is 6, patient is currently on Coumadin. INR is therapeutic 2.04. She will continue to use Coumadin at home.  Diabetes mellitus with renal complications- continue home dose of Toujeo, Januvia, Amaryl  Hypertension-continue amlodipine, Coreg, Cozaar  Hyperlipidemia-continue pravastatin   Procedures:  Echocardiogram-EF 38-25%, grade 2 diastolic dysfunction  Consultations:  None  Discharge Exam: Vitals:   12/16/16 0314 12/16/16 1227  BP: (!) 148/61 118/63  Pulse: (!) 59 (!) 58  Resp: 16 18  Temp: 98.7 F (37.1 C) 98.9 F (37.2 C)  SpO2: 96% 99%    General:  Appears in no acute distress Cardiovascular:  S1-S2, regular Respiratory:  clear  to auscultation bilaterally  Discharge Instructions   Discharge Instructions    Diet - low sodium heart healthy    Complete by:  As directed     Increase activity slowly    Complete by:  As directed      Current Discharge Medication List    CONTINUE these medications which have NOT CHANGED   Details  amLODipine (NORVASC) 10 MG tablet Take 10 mg by mouth daily.    carvedilol (COREG) 6.25 MG tablet Take 6.25 mg by mouth 2 (two) times daily with a meal.    cholecalciferol (VITAMIN D) 1000 UNITS tablet Take 1,000 Units by mouth daily.    Famotidine-Ca Carb-Mag Hydrox (PEPCID COMPLETE PO) Take 1 tablet by mouth as needed (GERD).    ferrous sulfate 325 (65 FE) MG tablet Take 1 tablet (325 mg total) by mouth 3 (three) times daily with meals.    furosemide (LASIX) 40 MG tablet Take 3 tablets (120 mg total) by mouth 2 (two) times daily. Qty: 180 tablet, Refills: 0    glimepiride (AMARYL) 4 MG tablet Take 4 mg by mouth 2 (two) times daily.    levothyroxine (SYNTHROID, LEVOTHROID) 175 MCG tablet Patient takes 1 tablet a day by mouth daily excepet on Sundays she takes 1 1/2 by mouth. Refills: 5    Liniments (BLUE-EMU SUPER STRENGTH EX) Apply 1 application topically as needed (Back Pain).    losartan (COZAAR) 25 MG tablet Take 25 mg by mouth daily.     MAGNESIUM PO Take 1 tablet by mouth daily.    metolazone (ZAROXOLYN) 2.5 MG tablet TAKE 1 TABLET BY MOUTH TWICE A WEEK Qty: 24 tablet, Refills: 1    !! OVER THE COUNTER MEDICATION Place 1 drop into both eyes as needed (Dry eyes).    !! OVER THE COUNTER MEDICATION Place 1 application into both nostrils as needed (Nasal Dryness).    OXYGEN Inhale 1 L into the lungs at bedtime.    pantoprazole (PROTONIX) 40 MG tablet Take 40 mg by mouth 2 (two) times daily.    potassium chloride SA (K-DUR,KLOR-CON) 20 MEQ tablet Take 2 tablets (40 mEq total) by mouth 2 (two) times daily. Qty: 120 tablet, Refills: 0    pravastatin (PRAVACHOL) 40 MG tablet Take 40 mg by mouth at bedtime.     senna (SENOKOT) 8.6 MG TABS tablet Take 2 tablets by mouth as needed for mild constipation.     silver  sulfADIAZINE (SILVADENE) 1 % cream Apply 1 application topically daily.    sitaGLIPtin (JANUVIA) 50 MG tablet Take 25 mg by mouth daily. Take half a tablet daily.    TOUJEO SOLOSTAR 300 UNIT/ML SOPN 35 Units at bedtime.     warfarin (COUMADIN) 3 MG tablet Take as directed by [facility] Qty: 70 tablet, Refills: 3    acetaminophen (TYLENOL) 500 MG tablet Take 500 mg by mouth every 6 (six) hours as needed for mild pain, moderate pain, fever or headache.    docusate sodium 100 MG CAPS Take 100 mg by mouth 2 (two) times daily. OTC Qty: 10 capsule, Refills: 0    isosorbide mononitrate (IMDUR) 30 MG 24 hr tablet Take 30 mg by mouth daily.    mometasone (ELOCON) 0.1 % cream Apply 1 application topically daily.  Refills: 0    nitroGLYCERIN (NITROSTAT) 0.4 MG SL tablet Place 0.4 mg under the tongue every 5 (five) minutes as needed for chest pain.    nystatin-triamcinolone (MYCOLOG II) cream Apply 1 application topically 2 (two)  times daily.    predniSONE (DELTASONE) 10 MG tablet Take 10 mg by mouth daily as needed (gout flare up).    traMADol (ULTRAM) 50 MG tablet Take 1 tablet (50 mg total) by mouth every 6 (six) hours as needed for pain. Qty: 30 tablet, Refills: 0     !! - Potential duplicate medications found. Please discuss with provider.    STOP taking these medications     doxycycline (VIBRAMYCIN) 100 MG capsule        No Known Allergies    The results of significant diagnostics from this hospitalization (including imaging, microbiology, ancillary and laboratory) are listed below for reference.    Significant Diagnostic Studies: Dg Chest 2 View  Result Date: 12/14/2016 CLINICAL DATA:  shob x 1 day. H/o CHF, a-fib, previous symptoms in past. EXAM: CHEST - 2 VIEW COMPARISON:  11/20/2016 FINDINGS: Interval increase in mild diffuse interstitial opacities. Some patchy perihilar airspace opacities left worse than right, new since previous study. Heart size upper limits normal.   Atheromatous aorta. No effusion. Anterior vertebral endplate spurring at multiple levels in the lower thoracic spine. IMPRESSION: 1. Some increase in asymmetric interstitial and airspace edema or infiltrates as above. 2.  Aortic Atherosclerosis (ICD10-170.0) Electronically Signed   By: Lucrezia Europe M.D.   On: 12/14/2016 13:51   Dg Chest 2 View  Result Date: 11/20/2016 CLINICAL DATA:  83 year old female with history grace respiratory effort, cough and fluid retention. EXAM: CHEST  2 VIEW COMPARISON:  Chest x-ray 08/03/2015. FINDINGS: Lung volumes are normal. Diffuse peribronchial cuffing and widespread interstitial prominence. No confluent consolidative airspace disease. No pleural effusions. No cephalization of the pulmonary vasculature. Mild cardiomegaly. Upper mediastinal contours are within normal limits. Aortic atherosclerosis. IMPRESSION: 1. Diffuse peribronchial cuffing, concerning for an acute bronchitis. There is also a generalize interstitial prominence, which appears relatively chronic and similar to recent prior examinations. 2. Mild cardiomegaly. 3. Aortic atherosclerosis. Electronically Signed   By: Vinnie Langton M.D.   On: 11/20/2016 17:48    Microbiology: No results found for this or any previous visit (from the past 240 hour(s)).   Labs: Basic Metabolic Panel:  Recent Labs Lab 12/14/16 1311 12/15/16 0659 12/16/16 0308  NA 137 138 138  K 3.8 3.4* 3.3*  CL 95* 95* 94*  CO2 29 32 30  GLUCOSE 295* 179* 127*  BUN 45* 43* 50*  CREATININE 2.23* 2.23* 2.56*  CALCIUM 9.5 9.4 9.6   Liver Function Tests:  Recent Labs Lab 12/14/16 1311  AST 23  ALT 24  ALKPHOS 90  BILITOT 0.6  PROT 7.4  ALBUMIN 3.5   No results for input(s): LIPASE, AMYLASE in the last 168 hours. No results for input(s): AMMONIA in the last 168 hours. CBC:  Recent Labs Lab 12/14/16 1311 12/15/16 0458  WBC 8.9 9.4  HGB 10.7* 9.9*  HCT 32.4* 29.9*  MCV 97.9 95.2  PLT 240 238   Cardiac  Enzymes:  Recent Labs Lab 12/14/16 1311 12/14/16 2128 12/15/16 0214 12/15/16 0659  TROPONINI 0.03* 0.03* 0.03* 0.03*   BNP: BNP (last 3 results)  Recent Labs  11/20/16 1746 12/14/16 1311  BNP 290.9* 385.0*    ProBNP (last 3 results) No results for input(s): PROBNP in the last 8760 hours.  CBG:  Recent Labs Lab 12/15/16 1126 12/15/16 1711 12/15/16 2102 12/16/16 0742 12/16/16 1134  GLUCAP 188* 187* 215* 127* 212*       Signed:  Oswald Hillock MD.  [facility] 12/16/2016, 12:44 PM

## 2016-12-19 ENCOUNTER — Ambulatory Visit (INDEPENDENT_AMBULATORY_CARE_PROVIDER_SITE_OTHER): Payer: Medicare Other | Admitting: Pharmacist

## 2016-12-19 ENCOUNTER — Telehealth: Payer: Self-pay | Admitting: Cardiology

## 2016-12-19 ENCOUNTER — Encounter: Payer: Self-pay | Admitting: Pharmacist

## 2016-12-19 DIAGNOSIS — Z5181 Encounter for therapeutic drug level monitoring: Secondary | ICD-10-CM

## 2016-12-19 DIAGNOSIS — I482 Chronic atrial fibrillation, unspecified: Secondary | ICD-10-CM

## 2016-12-19 LAB — PROTIME-INR: INR: 2.5 — AB (ref 0.9–1.1)

## 2016-12-19 NOTE — Telephone Encounter (Signed)
New message    Pt daughter is calling to ask what she can give her mother for a cough. She said she coughs all day and all night. Please call.

## 2016-12-19 NOTE — Telephone Encounter (Signed)
Reviewed with Dr Smith--per Dr Zack Seal new recommendations, take extra lasix tomorrow per pt's usual routine, contact primary care for recommendation for dry cough, report to ED for change in symptoms.

## 2016-12-19 NOTE — Telephone Encounter (Signed)
I spoke with pt's daughter, Randell Patient. Randell Patient states pt was discharge from hospital on Friday December 16, 2016. Randell Patient states that pt has a dry cough almost 24 hours a day for the past week-she asked about cough in hospital and was told could be a combination of heart failure and possible  pneumonia-she was not treated for this. [name] states pt's weight at home on 12/17/16 198 lbs, 12/18/16 199 lbs 12/19/16 200 lbs. LE edema/shortness of breath are about the same as time of discharge from hospital.

## 2016-12-22 DIAGNOSIS — I5032 Chronic diastolic (congestive) heart failure: Secondary | ICD-10-CM | POA: Diagnosis not present

## 2016-12-22 DIAGNOSIS — I130 Hypertensive heart and chronic kidney disease with heart failure and stage 1 through stage 4 chronic kidney disease, or unspecified chronic kidney disease: Secondary | ICD-10-CM | POA: Diagnosis not present

## 2016-12-22 DIAGNOSIS — J189 Pneumonia, unspecified organism: Secondary | ICD-10-CM | POA: Diagnosis not present

## 2016-12-22 DIAGNOSIS — I482 Chronic atrial fibrillation: Secondary | ICD-10-CM | POA: Diagnosis not present

## 2016-12-22 DIAGNOSIS — I2510 Atherosclerotic heart disease of native coronary artery without angina pectoris: Secondary | ICD-10-CM | POA: Diagnosis not present

## 2016-12-30 ENCOUNTER — Other Ambulatory Visit (HOSPITAL_COMMUNITY): Payer: Self-pay | Admitting: *Deleted

## 2016-12-30 ENCOUNTER — Encounter: Payer: Self-pay | Admitting: Pharmacist

## 2016-12-30 DIAGNOSIS — Z7901 Long term (current) use of anticoagulants: Secondary | ICD-10-CM | POA: Diagnosis not present

## 2016-12-30 LAB — POCT INR: INR: 3.1

## 2017-01-02 ENCOUNTER — Encounter (HOSPITAL_COMMUNITY)
Admission: RE | Admit: 2017-01-02 | Discharge: 2017-01-02 | Disposition: A | Discharge status: Home / Self Care | Payer: Medicare Other | Source: Ambulatory Visit | Attending: Nephrology | Admitting: Nephrology

## 2017-01-02 ENCOUNTER — Ambulatory Visit (INDEPENDENT_AMBULATORY_CARE_PROVIDER_SITE_OTHER): Payer: Medicare Other | Admitting: Cardiovascular Disease

## 2017-01-02 DIAGNOSIS — I482 Chronic atrial fibrillation, unspecified: Secondary | ICD-10-CM

## 2017-01-02 DIAGNOSIS — Z5181 Encounter for therapeutic drug level monitoring: Secondary | ICD-10-CM

## 2017-01-02 DIAGNOSIS — N184 Chronic kidney disease, stage 4 (severe): Secondary | ICD-10-CM | POA: Diagnosis not present

## 2017-01-02 LAB — POCT HEMOGLOBIN-HEMACUE: HEMOGLOBIN: 10.3 g/dL — AB (ref 12.0–15.0)

## 2017-01-02 LAB — IRON AND TIBC
Iron: 50 ug/dL (ref 28–170)
Saturation Ratios: 22 % (ref 10.4–31.8)
TIBC: 223 ug/dL — AB (ref 250–450)
UIBC: 173 ug/dL

## 2017-01-02 LAB — FERRITIN: Ferritin: 255 ng/mL (ref 11–307)

## 2017-01-02 MED ORDER — DARBEPOETIN ALFA 150 MCG/0.3ML IJ SOSY: 150.0000 ug | PREFILLED_SYRINGE | INTRAMUSCULAR | Status: DC

## 2017-01-02 MED ORDER — EPOETIN ALFA 10000 UNIT/ML IJ SOLN
10000.0000 [IU] | INTRAMUSCULAR | Status: DC
  Administered 2017-01-02: 10000 [IU] via SUBCUTANEOUS

## 2017-01-02 MED ORDER — EPOETIN ALFA 10000 UNIT/ML IJ SOLN
INTRAMUSCULAR | Status: AC
  Filled 2017-01-02: qty 1

## 2017-01-05 ENCOUNTER — Encounter: Payer: Self-pay | Admitting: Cardiology

## 2017-01-05 ENCOUNTER — Ambulatory Visit (INDEPENDENT_AMBULATORY_CARE_PROVIDER_SITE_OTHER): Payer: Medicare Other | Admitting: Cardiology

## 2017-01-05 VITALS — BP 124/62 | HR 66 | Ht 62.0 in | Wt 201.4 lb

## 2017-01-05 DIAGNOSIS — I481 Persistent atrial fibrillation: Secondary | ICD-10-CM

## 2017-01-05 DIAGNOSIS — I4819 Other persistent atrial fibrillation: Secondary | ICD-10-CM

## 2017-01-05 DIAGNOSIS — Z79899 Other long term (current) drug therapy: Secondary | ICD-10-CM

## 2017-01-05 DIAGNOSIS — I10 Essential (primary) hypertension: Secondary | ICD-10-CM
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
## 2017-01-05 NOTE — Patient Instructions (Signed)
Medication Instructions:  The current medical regimen is effective;  continue present plan and medications. You may use Mucinex as needed.  Follow-Up: Follow up in 4 months with Truitt Merle, NP  If you need a refill on your cardiac medications before your next appointment, please call your pharmacy.  Thank you for choosing [facility]!!

## 2017-01-13 LAB — POCT INR: INR: 2.9

## 2017-01-16 ENCOUNTER — Ambulatory Visit (INDEPENDENT_AMBULATORY_CARE_PROVIDER_SITE_OTHER): Payer: Medicare Other | Admitting: Cardiology

## 2017-01-16 DIAGNOSIS — Z5181 Encounter for therapeutic drug level monitoring: Secondary | ICD-10-CM

## 2017-01-16 DIAGNOSIS — I482 Chronic atrial fibrillation, unspecified: Secondary | ICD-10-CM

## 2017-01-27 ENCOUNTER — Other Ambulatory Visit (HOSPITAL_COMMUNITY): Payer: Self-pay | Admitting: *Deleted

## 2017-01-27 LAB — PROTIME-INR: INR: 8 — AB (ref 0.9–1.1)

## 2017-01-30 ENCOUNTER — Ambulatory Visit (INDEPENDENT_AMBULATORY_CARE_PROVIDER_SITE_OTHER): Payer: Medicare Other

## 2017-01-30 ENCOUNTER — Encounter (HOSPITAL_COMMUNITY)
Admission: RE | Admit: 2017-01-30 | Discharge: 2017-01-30 | Disposition: A | Discharge status: Home / Self Care | Payer: Medicare Other | Source: Ambulatory Visit | Attending: Nephrology | Admitting: Nephrology

## 2017-01-30 DIAGNOSIS — Z5181 Encounter for therapeutic drug level monitoring: Secondary | ICD-10-CM | POA: Diagnosis not present

## 2017-01-30 DIAGNOSIS — I482 Chronic atrial fibrillation, unspecified: Secondary | ICD-10-CM

## 2017-01-30 DIAGNOSIS — N184 Chronic kidney disease, stage 4 (severe): Secondary | ICD-10-CM | POA: Insufficient documentation

## 2017-01-30 DIAGNOSIS — Z7901 Long term (current) use of anticoagulants: Secondary | ICD-10-CM | POA: Diagnosis not present

## 2017-01-30 LAB — PROTIME-INR: INR: 6 — AB (ref <=1.1)

## 2017-01-30 LAB — IRON AND TIBC
IRON: 45 ug/dL (ref 28–170)
Saturation Ratios: 18 % (ref 10.4–31.8)
TIBC: 246 ug/dL — ABNORMAL LOW (ref 250–450)
UIBC: 201 ug/dL

## 2017-01-30 LAB — POCT HEMOGLOBIN-HEMACUE: Hemoglobin: 11 g/dL — ABNORMAL LOW (ref 12.0–15.0)

## 2017-01-30 LAB — FERRITIN: Ferritin: 484 ng/mL — ABNORMAL HIGH (ref 11–307)

## 2017-01-30 MED ORDER — EPOETIN ALFA 10000 UNIT/ML IJ SOLN
10000.0000 [IU] | INTRAMUSCULAR | Status: DC
  Administered 2017-01-30: 10000 [IU] via SUBCUTANEOUS

## 2017-01-30 MED ORDER — EPOETIN ALFA 10000 UNIT/ML IJ SOLN
INTRAMUSCULAR | Status: AC
  Filled 2017-01-30: qty 1

## 2017-02-02 ENCOUNTER — Ambulatory Visit (INDEPENDENT_AMBULATORY_CARE_PROVIDER_SITE_OTHER): Payer: Medicare Other | Admitting: Interventional Cardiology

## 2017-02-02 DIAGNOSIS — I4891 Unspecified atrial fibrillation: Secondary | ICD-10-CM | POA: Diagnosis not present

## 2017-02-02 DIAGNOSIS — Z5181 Encounter for therapeutic drug level monitoring: Secondary | ICD-10-CM

## 2017-02-02 LAB — POCT INR: INR: 2.3

## 2017-02-05 ENCOUNTER — Other Ambulatory Visit: Payer: Self-pay | Admitting: Cardiology

## 2017-02-07 DIAGNOSIS — N184 Chronic kidney disease, stage 4 (severe): Secondary | ICD-10-CM | POA: Diagnosis not present

## 2017-02-07 DIAGNOSIS — J189 Pneumonia, unspecified organism: Secondary | ICD-10-CM | POA: Diagnosis not present

## 2017-02-07 DIAGNOSIS — E1122 Type 2 diabetes mellitus with diabetic chronic kidney disease: Secondary | ICD-10-CM | POA: Diagnosis not present

## 2017-02-07 DIAGNOSIS — Z794 Long term (current) use of insulin: Secondary | ICD-10-CM | POA: Diagnosis not present

## 2017-02-07 DIAGNOSIS — E1142 Type 2 diabetes mellitus with diabetic polyneuropathy: Secondary | ICD-10-CM | POA: Diagnosis not present

## 2017-02-07 DIAGNOSIS — E890 Postprocedural hypothyroidism: Secondary | ICD-10-CM | POA: Diagnosis not present

## 2017-02-07 DIAGNOSIS — R05 Cough: Secondary | ICD-10-CM | POA: Diagnosis not present

## 2017-02-07 DIAGNOSIS — Z23 Encounter for immunization: Secondary | ICD-10-CM | POA: Diagnosis not present

## 2017-02-10 ENCOUNTER — Ambulatory Visit (INDEPENDENT_AMBULATORY_CARE_PROVIDER_SITE_OTHER): Payer: Medicare Other | Admitting: Internal Medicine

## 2017-02-10 DIAGNOSIS — Z5181 Encounter for therapeutic drug level monitoring: Secondary | ICD-10-CM | POA: Diagnosis not present

## 2017-02-10 DIAGNOSIS — I4891 Unspecified atrial fibrillation: Secondary | ICD-10-CM

## 2017-02-10 LAB — POCT INR: INR: 2.9

## 2017-02-22 DIAGNOSIS — N184 Chronic kidney disease, stage 4 (severe): Secondary | ICD-10-CM | POA: Diagnosis not present

## 2017-02-22 DIAGNOSIS — I4891 Unspecified atrial fibrillation: Secondary | ICD-10-CM | POA: Diagnosis not present

## 2017-02-22 DIAGNOSIS — I10 Essential (primary) hypertension: Secondary | ICD-10-CM | POA: Diagnosis not present

## 2017-02-22 DIAGNOSIS — D631 Anemia in chronic kidney disease: Secondary | ICD-10-CM | POA: Diagnosis not present

## 2017-02-22 DIAGNOSIS — E119 Type 2 diabetes mellitus without complications: Secondary | ICD-10-CM | POA: Diagnosis not present

## 2017-02-22 DIAGNOSIS — N2581 Secondary hyperparathyroidism of renal origin: Secondary | ICD-10-CM | POA: Diagnosis not present

## 2017-02-24 ENCOUNTER — Ambulatory Visit (INDEPENDENT_AMBULATORY_CARE_PROVIDER_SITE_OTHER): Payer: Medicare Other | Admitting: Internal Medicine

## 2017-02-24 DIAGNOSIS — I4891 Unspecified atrial fibrillation: Secondary | ICD-10-CM

## 2017-02-24 DIAGNOSIS — Z7901 Long term (current) use of anticoagulants: Secondary | ICD-10-CM | POA: Diagnosis not present

## 2017-02-24 DIAGNOSIS — Z5181 Encounter for therapeutic drug level monitoring: Secondary | ICD-10-CM

## 2017-02-24 LAB — POCT INR: INR: 4

## 2017-02-27 ENCOUNTER — Encounter (HOSPITAL_COMMUNITY)
Admission: RE | Admit: 2017-02-27 | Discharge: 2017-02-27 | Disposition: A | Discharge status: Home / Self Care | Payer: Medicare Other | Source: Ambulatory Visit | Attending: Nephrology | Admitting: Nephrology

## 2017-02-27 DIAGNOSIS — N184 Chronic kidney disease, stage 4 (severe): Secondary | ICD-10-CM | POA: Diagnosis not present

## 2017-02-27 LAB — IRON AND TIBC
Iron: 38 ug/dL (ref 28–170)
Saturation Ratios: 17 % (ref 10.4–31.8)
TIBC: 228 ug/dL — ABNORMAL LOW (ref 250–450)
UIBC: 190 ug/dL

## 2017-02-27 LAB — RENAL FUNCTION PANEL
ALBUMIN: 3.4 g/dL — AB (ref 3.5–5.0)
ANION GAP: 10 (ref 5–15)
BUN: 60 mg/dL — ABNORMAL HIGH (ref 6–20)
CO2: 29 mmol/L (ref 22–32)
Calcium: 9.7 mg/dL (ref 8.9–10.3)
Chloride: 98 mmol/L — ABNORMAL LOW (ref 101–111)
Creatinine, Ser: 2.29 mg/dL — ABNORMAL HIGH (ref 0.44–1.00)
GFR, EST AFRICAN AMERICAN: 21 mL/min — AB (ref >60)
GFR, EST NON AFRICAN AMERICAN: 18 mL/min — AB (ref >60)
GLUCOSE: 260 mg/dL — AB (ref 65–99)
PHOSPHORUS: 2.9 mg/dL (ref 2.5–4.6)
Potassium: 4 mmol/L (ref 3.5–5.1)
SODIUM: 137 mmol/L (ref 135–145)

## 2017-02-27 LAB — POCT HEMOGLOBIN-HEMACUE: HEMOGLOBIN: 10.5 g/dL — AB (ref 12.0–15.0)

## 2017-02-27 LAB — FERRITIN: Ferritin: 425 ng/mL — ABNORMAL HIGH (ref 11–307)

## 2017-02-27 MED ORDER — EPOETIN ALFA 10000 UNIT/ML IJ SOLN
INTRAMUSCULAR | Status: AC
  Administered 2017-02-27: 10000 [IU] via SUBCUTANEOUS
  Filled 2017-02-27: qty 1

## 2017-02-27 MED ORDER — EPOETIN ALFA 10000 UNIT/ML IJ SOLN
10000.0000 [IU] | INTRAMUSCULAR | Status: DC
  Administered 2017-02-27: 10000 [IU] via SUBCUTANEOUS

## 2017-03-10 ENCOUNTER — Ambulatory Visit (INDEPENDENT_AMBULATORY_CARE_PROVIDER_SITE_OTHER): Payer: Medicare Other | Admitting: Cardiovascular Disease

## 2017-03-10 DIAGNOSIS — I4891 Unspecified atrial fibrillation: Secondary | ICD-10-CM

## 2017-03-10 DIAGNOSIS — Z5181 Encounter for therapeutic drug level monitoring: Secondary | ICD-10-CM | POA: Diagnosis not present

## 2017-03-10 LAB — POCT INR: INR: 3.2

## 2017-03-27 ENCOUNTER — Ambulatory Visit (INDEPENDENT_AMBULATORY_CARE_PROVIDER_SITE_OTHER): Payer: Medicare Other | Admitting: *Deleted

## 2017-03-27 ENCOUNTER — Encounter (HOSPITAL_COMMUNITY)
Admission: RE | Admit: 2017-03-27 | Discharge: 2017-03-27 | Disposition: A | Discharge status: Home / Self Care | Payer: Medicare Other | Source: Ambulatory Visit | Attending: Nephrology | Admitting: Nephrology

## 2017-03-27 VITALS — BP 154/45 | HR 58 | Resp 18

## 2017-03-27 DIAGNOSIS — Z5181 Encounter for therapeutic drug level monitoring: Secondary | ICD-10-CM | POA: Diagnosis not present

## 2017-03-27 DIAGNOSIS — N184 Chronic kidney disease, stage 4 (severe): Secondary | ICD-10-CM

## 2017-03-27 DIAGNOSIS — I4891 Unspecified atrial fibrillation: Secondary | ICD-10-CM

## 2017-03-27 LAB — RENAL FUNCTION PANEL
ANION GAP: 11 (ref 5–15)
Albumin: 3.4 g/dL — ABNORMAL LOW (ref 3.5–5.0)
BUN: 37 mg/dL — ABNORMAL HIGH (ref 6–20)
CALCIUM: 9.4 mg/dL (ref 8.9–10.3)
CO2: 29 mmol/L (ref 22–32)
CREATININE: 2.07 mg/dL — AB (ref 0.44–1.00)
Chloride: 99 mmol/L — ABNORMAL LOW (ref 101–111)
GFR, EST AFRICAN AMERICAN: 23 mL/min — AB (ref >60)
GFR, EST NON AFRICAN AMERICAN: 20 mL/min — AB (ref >60)
Glucose, Bld: 224 mg/dL — ABNORMAL HIGH (ref 65–99)
PHOSPHORUS: 2.9 mg/dL (ref 2.5–4.6)
Potassium: 4.1 mmol/L (ref 3.5–5.1)
SODIUM: 139 mmol/L (ref 135–145)

## 2017-03-27 LAB — IRON AND TIBC
Iron: 53 ug/dL (ref 28–170)
Saturation Ratios: 23 % (ref 10.4–31.8)
TIBC: 228 ug/dL — AB (ref 250–450)
UIBC: 175 ug/dL

## 2017-03-27 LAB — FERRITIN: FERRITIN: 470 ng/mL — AB (ref 11–307)

## 2017-03-27 MED ORDER — EPOETIN ALFA 10000 UNIT/ML IJ SOLN
INTRAMUSCULAR | Status: AC
  Administered 2017-03-27: 11:00:00 10000 [IU] via SUBCUTANEOUS
  Filled 2017-03-27: qty 1

## 2017-03-27 MED ORDER — EPOETIN ALFA 10000 UNIT/ML IJ SOLN
10000.0000 [IU] | INTRAMUSCULAR | Status: DC
  Administered 2017-03-27: 10000 [IU] via SUBCUTANEOUS

## 2017-03-27 NOTE — Patient Instructions (Signed)
Take  2.5 tablets today, then continue taking  2 tablets daily except 1 tablet on Sundays, Tuesdays, and Thursdays. Recheck INR in 2 weeks.  Call if placed on any new medications [phone]

## 2017-03-28 LAB — POCT HEMOGLOBIN-HEMACUE: HEMOGLOBIN: 10.6 g/dL — AB (ref 12.0–15.0)

## 2017-04-07 ENCOUNTER — Ambulatory Visit (INDEPENDENT_AMBULATORY_CARE_PROVIDER_SITE_OTHER): Payer: Medicare Other | Admitting: Internal Medicine

## 2017-04-07 DIAGNOSIS — Z5181 Encounter for therapeutic drug level monitoring: Secondary | ICD-10-CM | POA: Diagnosis not present

## 2017-04-07 DIAGNOSIS — I4891 Unspecified atrial fibrillation: Secondary | ICD-10-CM

## 2017-04-07 LAB — POCT INR: INR: 2

## 2017-04-21 LAB — POCT INR: INR: 1.6

## 2017-04-24 ENCOUNTER — Ambulatory Visit (INDEPENDENT_AMBULATORY_CARE_PROVIDER_SITE_OTHER): Payer: Medicare Other | Admitting: Interventional Cardiology

## 2017-04-24 ENCOUNTER — Encounter (HOSPITAL_COMMUNITY)
Admission: RE | Admit: 2017-04-24 | Discharge: 2017-04-24 | Disposition: A | Discharge status: Home / Self Care | Payer: Medicare Other | Source: Ambulatory Visit | Attending: Nephrology | Admitting: Nephrology

## 2017-04-24 VITALS — BP 145/33 | HR 57 | Resp 18

## 2017-04-24 DIAGNOSIS — Z5181 Encounter for therapeutic drug level monitoring: Secondary | ICD-10-CM | POA: Diagnosis not present

## 2017-04-24 DIAGNOSIS — I4891 Unspecified atrial fibrillation: Secondary | ICD-10-CM | POA: Diagnosis not present

## 2017-04-24 DIAGNOSIS — N184 Chronic kidney disease, stage 4 (severe): Secondary | ICD-10-CM

## 2017-04-24 LAB — IRON AND TIBC
IRON: 43 ug/dL (ref 28–170)
Saturation Ratios: 19 % (ref 10.4–31.8)
TIBC: 227 ug/dL — ABNORMAL LOW (ref 250–450)
UIBC: 184 ug/dL

## 2017-04-24 LAB — FERRITIN: FERRITIN: 497 ng/mL — AB (ref 11–307)

## 2017-04-24 LAB — POCT HEMOGLOBIN-HEMACUE: HEMOGLOBIN: 10.7 g/dL — AB (ref 12.0–15.0)

## 2017-04-24 MED ORDER — EPOETIN ALFA 10000 UNIT/ML IJ SOLN
INTRAMUSCULAR | Status: AC
  Filled 2017-04-24: qty 1

## 2017-04-24 MED ORDER — EPOETIN ALFA 10000 UNIT/ML IJ SOLN
10000.0000 [IU] | INTRAMUSCULAR | Status: DC
  Administered 2017-04-24: 11:00:00 10000 [IU] via SUBCUTANEOUS

## 2017-04-25 ENCOUNTER — Ambulatory Visit: Payer: Medicare Other | Admitting: Nurse Practitioner

## 2017-04-28 ENCOUNTER — Ambulatory Visit (INDEPENDENT_AMBULATORY_CARE_PROVIDER_SITE_OTHER): Payer: Medicare Other | Admitting: Internal Medicine

## 2017-04-28 DIAGNOSIS — Z5181 Encounter for therapeutic drug level monitoring: Secondary | ICD-10-CM | POA: Diagnosis not present

## 2017-04-28 DIAGNOSIS — I4891 Unspecified atrial fibrillation: Secondary | ICD-10-CM

## 2017-04-28 LAB — POCT INR: INR: 2.1

## 2017-04-28 NOTE — Patient Instructions (Signed)
Description   Spoke with [name]-pt's dtr & instructed pt to continue taking 2 tablets daily except 1 tablet on Sundays, Tuesdays, and Thursdays. Recheck INR in 2 weeks.  Call if placed on any new medications [phone]

## 2017-05-12 ENCOUNTER — Ambulatory Visit (INDEPENDENT_AMBULATORY_CARE_PROVIDER_SITE_OTHER): Payer: Medicare Other | Admitting: Internal Medicine

## 2017-05-12 DIAGNOSIS — Z5181 Encounter for therapeutic drug level monitoring: Secondary | ICD-10-CM | POA: Diagnosis not present

## 2017-05-12 DIAGNOSIS — Z7901 Long term (current) use of anticoagulants: Secondary | ICD-10-CM | POA: Diagnosis not present

## 2017-05-12 LAB — POCT INR: INR: 2.2

## 2017-05-12 NOTE — Progress Notes (Signed)
22

## 2017-05-15 DIAGNOSIS — I5032 Chronic diastolic (congestive) heart failure: Secondary | ICD-10-CM | POA: Diagnosis not present

## 2017-05-15 DIAGNOSIS — R0602 Shortness of breath: Secondary | ICD-10-CM | POA: Diagnosis not present

## 2017-05-15 DIAGNOSIS — I2510 Atherosclerotic heart disease of native coronary artery without angina pectoris: Secondary | ICD-10-CM | POA: Diagnosis not present

## 2017-05-15 DIAGNOSIS — G4734 Idiopathic sleep related nonobstructive alveolar hypoventilation: Secondary | ICD-10-CM | POA: Diagnosis not present

## 2017-05-15 DIAGNOSIS — I482 Chronic atrial fibrillation: Secondary | ICD-10-CM | POA: Diagnosis not present

## 2017-05-16 ENCOUNTER — Telehealth: Payer: Self-pay | Admitting: Cardiology

## 2017-05-16 NOTE — Telephone Encounter (Signed)
Madeline [name] calling, states that she took patient to family doctor due to trouble breathing. PCP ran some tests and stated that Patient's CHF has increased and suggested patient contact Dr. Marlou Porch.

## 2017-05-16 NOTE — Telephone Encounter (Signed)
Spoke with daughter RE: s/s in increased wt gain, increased SOB and fatigue since New Years.  Daughter took pt to see PCP yesterday.  02 Sat 95-96% with walking, wt up to 203 lbs, kidney function "better than before" BNP 275.  Per daughter, pt took Metolazone 2.5 mg yesterday ad is feeling some better.  Urine outpt has increased and wt is down 1 lb.  She expects this to continue to decrease since pt is still urinating quite a bit.  She will continue medications as listed, continue daily wts and c/b if increased SOB, edema or fatigue prior to appt as scheduled.

## 2017-05-21 ENCOUNTER — Emergency Department (HOSPITAL_BASED_OUTPATIENT_CLINIC_OR_DEPARTMENT_OTHER): Payer: Medicare Other

## 2017-05-21 ENCOUNTER — Emergency Department (HOSPITAL_BASED_OUTPATIENT_CLINIC_OR_DEPARTMENT_OTHER)
Admission: EM | Admit: 2017-05-21 | Discharge: 2017-05-21 | Disposition: A | Discharge status: Home / Self Care | Payer: Medicare Other | Attending: Emergency Medicine | Admitting: Emergency Medicine

## 2017-05-21 ENCOUNTER — Encounter (HOSPITAL_BASED_OUTPATIENT_CLINIC_OR_DEPARTMENT_OTHER): Payer: Self-pay | Admitting: *Deleted

## 2017-05-21 ENCOUNTER — Other Ambulatory Visit: Payer: Self-pay

## 2017-05-21 DIAGNOSIS — R791 Abnormal coagulation profile: Secondary | ICD-10-CM | POA: Insufficient documentation

## 2017-05-21 DIAGNOSIS — Z5181 Encounter for therapeutic drug level monitoring: Secondary | ICD-10-CM

## 2017-05-21 DIAGNOSIS — R319 Hematuria, unspecified: Secondary | ICD-10-CM | POA: Diagnosis not present

## 2017-05-21 DIAGNOSIS — E1122 Type 2 diabetes mellitus with diabetic chronic kidney disease: Secondary | ICD-10-CM | POA: Insufficient documentation

## 2017-05-21 DIAGNOSIS — N184 Chronic kidney disease, stage 4 (severe): Secondary | ICD-10-CM | POA: Diagnosis not present

## 2017-05-21 DIAGNOSIS — E039 Hypothyroidism, unspecified: Secondary | ICD-10-CM | POA: Diagnosis not present

## 2017-05-21 DIAGNOSIS — R310 Gross hematuria: Secondary | ICD-10-CM

## 2017-05-21 DIAGNOSIS — Z7901 Long term (current) use of anticoagulants: Secondary | ICD-10-CM | POA: Diagnosis not present

## 2017-05-21 DIAGNOSIS — R600 Localized edema: Secondary | ICD-10-CM | POA: Insufficient documentation

## 2017-05-21 DIAGNOSIS — R1030 Lower abdominal pain, unspecified: Secondary | ICD-10-CM | POA: Diagnosis not present

## 2017-05-21 DIAGNOSIS — R0602 Shortness of breath: Secondary | ICD-10-CM | POA: Diagnosis not present

## 2017-05-21 DIAGNOSIS — I130 Hypertensive heart and chronic kidney disease with heart failure and stage 1 through stage 4 chronic kidney disease, or unspecified chronic kidney disease: Secondary | ICD-10-CM | POA: Diagnosis not present

## 2017-05-21 DIAGNOSIS — Z87891 Personal history of nicotine dependence: Secondary | ICD-10-CM | POA: Insufficient documentation

## 2017-05-21 DIAGNOSIS — R300 Dysuria: Secondary | ICD-10-CM | POA: Diagnosis present

## 2017-05-21 DIAGNOSIS — I5032 Chronic diastolic (congestive) heart failure: Secondary | ICD-10-CM | POA: Diagnosis not present

## 2017-05-21 DIAGNOSIS — Z794 Long term (current) use of insulin: Secondary | ICD-10-CM | POA: Diagnosis not present

## 2017-05-21 DIAGNOSIS — I4891 Unspecified atrial fibrillation: Secondary | ICD-10-CM | POA: Insufficient documentation

## 2017-05-21 LAB — URINALYSIS, ROUTINE W REFLEX MICROSCOPIC
Bilirubin Urine: NEGATIVE
Glucose, UA: NEGATIVE mg/dL
KETONES UR: NEGATIVE mg/dL
Nitrite: NEGATIVE
PROTEIN: 100 mg/dL — AB
Specific Gravity, Urine: 1.01 (ref 1.005–1.030)
pH: 7 (ref 5.0–8.0)

## 2017-05-21 LAB — CBC WITH DIFFERENTIAL/PLATELET
BASOS ABS: 0 10*3/uL (ref 0.0–0.1)
Basophils Relative: 0 %
EOS ABS: 0.2 10*3/uL (ref 0.0–0.7)
Eosinophils Relative: 2 %
HEMATOCRIT: 33.7 % — AB (ref 36.0–46.0)
Hemoglobin: 11 g/dL — ABNORMAL LOW (ref 12.0–15.0)
LYMPHS ABS: 1.8 10*3/uL (ref 0.7–4.0)
Lymphocytes Relative: 15 %
MCH: 32 pg (ref 26.0–34.0)
MCHC: 32.6 g/dL (ref 30.0–36.0)
MCV: 98 fL (ref 78.0–100.0)
MONO ABS: 1 10*3/uL (ref 0.1–1.0)
MONOS PCT: 8 %
Neutro Abs: 9.2 10*3/uL — ABNORMAL HIGH (ref 1.7–7.7)
Neutrophils Relative %: 75 %
Platelets: 205 10*3/uL (ref 150–400)
RBC: 3.44 MIL/uL — AB (ref 3.87–5.11)
RDW: 16.6 % — AB (ref 11.5–15.5)
WBC: 12.2 10*3/uL — AB (ref 4.0–10.5)

## 2017-05-21 LAB — COMPREHENSIVE METABOLIC PANEL
ALBUMIN: 3.8 g/dL (ref 3.5–5.0)
ALT: 15 U/L (ref 14–54)
ANION GAP: 11 (ref 5–15)
AST: 22 U/L (ref 15–41)
Alkaline Phosphatase: 87 U/L (ref 38–126)
BUN: 44 mg/dL — AB (ref 6–20)
CHLORIDE: 94 mmol/L — AB (ref 101–111)
CO2: 32 mmol/L (ref 22–32)
Calcium: 9.7 mg/dL (ref 8.9–10.3)
Creatinine, Ser: 2.05 mg/dL — ABNORMAL HIGH (ref 0.44–1.00)
GFR calc Af Amer: 24 mL/min — ABNORMAL LOW (ref >60)
GFR calc non Af Amer: 20 mL/min — ABNORMAL LOW (ref >60)
GLUCOSE: 276 mg/dL — AB (ref 65–99)
POTASSIUM: 3.9 mmol/L (ref 3.5–5.1)
SODIUM: 137 mmol/L (ref 135–145)
Total Bilirubin: 0.7 mg/dL (ref 0.3–1.2)
Total Protein: 7.7 g/dL (ref 6.5–8.1)

## 2017-05-21 LAB — URINALYSIS, MICROSCOPIC (REFLEX)

## 2017-05-21 LAB — PROTIME-INR
INR: 1.29
Prothrombin Time: 16 seconds — ABNORMAL HIGH (ref 11.4–15.2)

## 2017-05-21 LAB — BRAIN NATRIURETIC PEPTIDE: B Natriuretic Peptide: 305.8 pg/mL — ABNORMAL HIGH (ref 0.0–100.0)

## 2017-05-21 LAB — TROPONIN I: Troponin I: 0.03 ng/mL (ref <0.03)

## 2017-05-21 NOTE — ED Triage Notes (Signed)
Hx of bladder infection. Reports that she started feeling pressure in her lower abdomen and increased pain with urination x 3 days. States that she started antibiotics (Keflex) 3 days ago. Hematuria that started yesterday. No relief from pain since beginning antibiotics.

## 2017-05-21 NOTE — ED Notes (Signed)
Trop of 0.03 told to Dr. Darl Householder and primary RN.
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
## 2017-05-21 NOTE — ED Notes (Signed)
Pt returned from radiology.

## 2017-05-21 NOTE — Discharge Instructions (Signed)
Finish your keflex as prescribed.   You are likely going to have some blood in your urine.   Your BNP is slightly elevated (300). Continue lasix. Measure your daily weight and take metolazone if you are weight gain more than 1 pound.   Call urologist tomorrow to get follow up for further workup for hematuria.  You may take your tramadol as needed for pain or spasms   Your INR is 1.4. But since you have hematuria, will keep same dose of coumadin. You will need to recheck INR in a week   See your primary care doctor and cardiologist  Return to ER if you are unable to urinate, severe bladder spasms, fever, vomiting, chest pain, shortness of breath

## 2017-05-22 ENCOUNTER — Encounter (HOSPITAL_COMMUNITY)
Admission: RE | Admit: 2017-05-22 | Discharge: 2017-05-22 | Disposition: A | Discharge status: Home / Self Care | Payer: Medicare Other | Source: Ambulatory Visit | Attending: Nephrology | Admitting: Nephrology

## 2017-05-22 ENCOUNTER — Telehealth: Payer: Self-pay | Admitting: Pharmacist

## 2017-05-22 VITALS — BP 104/76 | HR 57 | Temp 98.4°F | Resp 20

## 2017-05-22 DIAGNOSIS — N184 Chronic kidney disease, stage 4 (severe): Secondary | ICD-10-CM | POA: Diagnosis not present

## 2017-05-22 LAB — POCT HEMOGLOBIN-HEMACUE: HEMOGLOBIN: 10.2 g/dL — AB (ref 12.0–15.0)

## 2017-05-22 LAB — IRON AND TIBC
Iron: 47 ug/dL (ref 28–170)
Saturation Ratios: 21 % (ref 10.4–31.8)
TIBC: 221 ug/dL — ABNORMAL LOW (ref 250–450)
UIBC: 174 ug/dL

## 2017-05-22 LAB — FERRITIN: Ferritin: 463 ng/mL — ABNORMAL HIGH (ref 11–307)

## 2017-05-22 MED ORDER — EPOETIN ALFA 10000 UNIT/ML IJ SOLN
10000.0000 [IU] | INTRAMUSCULAR | Status: DC
  Administered 2017-05-22: 10000 [IU] via SUBCUTANEOUS

## 2017-05-22 MED ORDER — EPOETIN ALFA 10000 UNIT/ML IJ SOLN
INTRAMUSCULAR | Status: AC
  Administered 2017-05-22: 10000 [IU] via SUBCUTANEOUS
  Filled 2017-05-22: qty 1

## 2017-05-22 NOTE — Telephone Encounter (Signed)
Daughter called back very concerned about her mother's BNP as it has gone up over the last week. She reports it was 200something on Monday at PCP and now is in 300s. She would also like Dr. Marlou Porch to know about this. She is wondering if her mother needs an appointment with cardiology at this time or ok to wait until scheduled appt? Advised will route to Dr. Marlou Porch and his nurse for advisement on appt.   She states understanding.

## 2017-05-22 NOTE — Telephone Encounter (Signed)
Spoke with pt's daughter and advised of Dr. Kingsley Plan recommendations. She reports that they did give her the metolazone today (she usually takes twice weekly to help with fluid). Advised I would send message back to Dr. Marlou Porch to let him know. She states understanding.

## 2017-05-22 NOTE — Telephone Encounter (Signed)
Take Metolazone 2.5mg  PO once today. Continue high dose lasix.  If we can take off some fluid, hopefully BP will decrease.  Candee Furbish, MD

## 2017-05-22 NOTE — Telephone Encounter (Signed)
Pt's daughter called to report that last Monday pt had some fluid retention and PCP prescribed more fluid pills for 2 days.  Pt started peeing blood on Friday. She is now on abx (keflex BID) started Friday. Bleeding stopped on Saturday. However, yesterday she start bleeding again. She is in severe pain when she has the blood in her urine. She reports pt went to ED Saturday - where several scans and tests were run. She has cysts in kidneys and a mass in bladder. Her INR at ED was 1.4, but she was advised not to increase warfarin due to bleeding.   Today bleeding and pain has subsided. Will have daughter check INR tomorrow (she is a Personal assistant).   She will be seen by Dr. Justin Mend for mass and cysts (hopefully today).   Will route to Dr. Marlou Porch for any additional recs and FYI.

## 2017-05-23 ENCOUNTER — Ambulatory Visit (INDEPENDENT_AMBULATORY_CARE_PROVIDER_SITE_OTHER): Payer: Medicare Other

## 2017-05-23 DIAGNOSIS — R310 Gross hematuria: Secondary | ICD-10-CM | POA: Diagnosis not present

## 2017-05-23 DIAGNOSIS — N3000 Acute cystitis without hematuria: Secondary | ICD-10-CM | POA: Diagnosis not present

## 2017-05-23 DIAGNOSIS — I4891 Unspecified atrial fibrillation: Secondary | ICD-10-CM

## 2017-05-23 DIAGNOSIS — N189 Chronic kidney disease, unspecified: Secondary | ICD-10-CM | POA: Diagnosis not present

## 2017-05-23 DIAGNOSIS — N281 Cyst of kidney, acquired: Secondary | ICD-10-CM | POA: Diagnosis not present

## 2017-05-23 DIAGNOSIS — Z5181 Encounter for therapeutic drug level monitoring: Secondary | ICD-10-CM

## 2017-05-23 LAB — URINE CULTURE: Culture: <10000 — AB

## 2017-05-23 LAB — POCT INR: INR: 1.6

## 2017-05-23 NOTE — Patient Instructions (Signed)
Description   Spoke with [name]-pt's dtr & instructed to have pt take 2 tablets today, then resume taking 2 tablets daily except 1 tablet on Sundays, Tuesdays, and Thursdays. WCB after appointment with Dr Tammi Klippel today to update plans regarding bladder mass and hematuria.  Cateter done in office thinks pt has UTI, treating with Keflex. Call if placed on any new medications [phone]

## 2017-05-29 ENCOUNTER — Ambulatory Visit (INDEPENDENT_AMBULATORY_CARE_PROVIDER_SITE_OTHER): Payer: Medicare Other | Admitting: Internal Medicine

## 2017-05-29 DIAGNOSIS — Z5181 Encounter for therapeutic drug level monitoring: Secondary | ICD-10-CM

## 2017-05-29 DIAGNOSIS — I4891 Unspecified atrial fibrillation: Secondary | ICD-10-CM | POA: Diagnosis not present

## 2017-05-29 LAB — POCT INR: INR: 2

## 2017-05-29 NOTE — Patient Instructions (Signed)
Description   Spoke with [name]-pt's dtr & instructed to have pt continue same dose of coumadin  2 tablets daily except 1 tablet on Sundays, Tuesdays, and Thursdays. Pt to start Cipro 500 mg daily today for 6 days Call if placed on any new medications [phone] Instructed to recheck INR on Thursday due to her being on Cipro

## 2017-06-01 ENCOUNTER — Ambulatory Visit (INDEPENDENT_AMBULATORY_CARE_PROVIDER_SITE_OTHER): Payer: Medicare Other | Admitting: Pharmacist

## 2017-06-01 DIAGNOSIS — I4891 Unspecified atrial fibrillation: Secondary | ICD-10-CM | POA: Diagnosis not present

## 2017-06-01 DIAGNOSIS — Z5181 Encounter for therapeutic drug level monitoring: Secondary | ICD-10-CM

## 2017-06-01 LAB — POCT INR: INR: 2.2

## 2017-06-02 ENCOUNTER — Encounter: Payer: Self-pay | Admitting: Cardiology

## 2017-06-02 ENCOUNTER — Ambulatory Visit (INDEPENDENT_AMBULATORY_CARE_PROVIDER_SITE_OTHER): Payer: Medicare Other | Admitting: Cardiology

## 2017-06-02 VITALS — BP 136/58 | HR 58 | Ht 62.0 in | Wt 207.0 lb

## 2017-06-02 DIAGNOSIS — Z7901 Long term (current) use of anticoagulants: Secondary | ICD-10-CM

## 2017-06-02 DIAGNOSIS — I10 Essential (primary) hypertension: Secondary | ICD-10-CM | POA: Diagnosis not present

## 2017-06-02 DIAGNOSIS — I4891 Unspecified atrial fibrillation: Secondary | ICD-10-CM
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
## 2017-06-02 NOTE — Patient Instructions (Signed)
Medication Instructions:  The current medical regimen is effective;  continue present plan and medications.   Follow-Up: Follow up in 4 months with Dr. Skains.  You will receive a letter in the mail 2 months before you are due.  Please call us when you receive this letter to schedule your follow up appointment.   If you need a refill on your cardiac medications before your next appointment, please call your pharmacy.  Thank you for choosing [facility]!!      

## 2017-06-12 ENCOUNTER — Ambulatory Visit (INDEPENDENT_AMBULATORY_CARE_PROVIDER_SITE_OTHER): Payer: Medicare Other | Admitting: Cardiology

## 2017-06-12 DIAGNOSIS — Z5181 Encounter for therapeutic drug level monitoring: Secondary | ICD-10-CM

## 2017-06-12 LAB — POCT INR: INR: 1.9

## 2017-06-14 ENCOUNTER — Other Ambulatory Visit: Payer: Self-pay | Admitting: *Deleted

## 2017-06-14 MED ORDER — METOLAZONE 2.5 MG PO TABS: ORAL_TABLET | ORAL | 2 refills | Status: DC

## 2017-06-19 ENCOUNTER — Encounter (HOSPITAL_COMMUNITY)
Admission: RE | Admit: 2017-06-19 | Discharge: 2017-06-19 | Disposition: A | Discharge status: Home / Self Care | Payer: Medicare Other | Source: Ambulatory Visit | Attending: Nephrology | Admitting: Nephrology

## 2017-06-19 VITALS — BP 142/64 | HR 57

## 2017-06-19 DIAGNOSIS — N184 Chronic kidney disease, stage 4 (severe): Secondary | ICD-10-CM

## 2017-06-19 LAB — IRON AND TIBC
Iron: 47 ug/dL (ref 28–170)
Saturation Ratios: 20 % (ref 10.4–31.8)
TIBC: 232 ug/dL — ABNORMAL LOW (ref 250–450)
UIBC: 185 ug/dL

## 2017-06-19 LAB — FERRITIN: FERRITIN: 408 ng/mL — AB (ref 11–307)

## 2017-06-19 LAB — POCT HEMOGLOBIN-HEMACUE: Hemoglobin: 10.6 g/dL — ABNORMAL LOW (ref 12.0–15.0)

## 2017-06-19 MED ORDER — EPOETIN ALFA 10000 UNIT/ML IJ SOLN
10000.0000 [IU] | INTRAMUSCULAR | Status: DC
  Administered 2017-06-19: 11:00:00 10000 [IU] via SUBCUTANEOUS

## 2017-06-19 MED ORDER — EPOETIN ALFA 10000 UNIT/ML IJ SOLN
INTRAMUSCULAR | Status: AC
  Filled 2017-06-19: qty 1

## 2017-06-23 DIAGNOSIS — R300 Dysuria: Secondary | ICD-10-CM | POA: Diagnosis not present

## 2017-06-23 DIAGNOSIS — N390 Urinary tract infection, site not specified: Secondary | ICD-10-CM | POA: Diagnosis not present

## 2017-06-26 ENCOUNTER — Ambulatory Visit (INDEPENDENT_AMBULATORY_CARE_PROVIDER_SITE_OTHER): Payer: Medicare Other | Admitting: Cardiology

## 2017-06-26 DIAGNOSIS — Z5181 Encounter for therapeutic drug level monitoring: Secondary | ICD-10-CM | POA: Diagnosis not present

## 2017-06-26 DIAGNOSIS — Z7901 Long term (current) use of anticoagulants: Secondary | ICD-10-CM | POA: Diagnosis not present

## 2017-06-26 LAB — POCT INR: INR: 1.8

## 2017-06-26 NOTE — Patient Instructions (Signed)
Description   Spoke with [name]-pt's dtr & instructed to have pt take 2 and 1/2  Tablets today Feb 18th   As she will finish Cipro tomorrow then continue same dose of coumadin   2 tablets daily except 1 tablet on Sundays, Tuesdays, and Thursdays. Recheck INR in 2 weeks. Call if placed on any new medications [phone]

## 2017-06-29 ENCOUNTER — Other Ambulatory Visit: Payer: Self-pay | Admitting: *Deleted

## 2017-06-29 MED ORDER — WARFARIN SODIUM 3 MG PO TABS: ORAL_TABLET | ORAL | 1 refills | Status: DC

## 2017-07-03 ENCOUNTER — Ambulatory Visit: Payer: Medicare Other | Admitting: Nurse Practitioner

## 2017-07-10 ENCOUNTER — Ambulatory Visit (INDEPENDENT_AMBULATORY_CARE_PROVIDER_SITE_OTHER): Payer: Medicare Other | Admitting: Internal Medicine

## 2017-07-10 DIAGNOSIS — Z5181 Encounter for therapeutic drug level monitoring: Secondary | ICD-10-CM | POA: Diagnosis not present

## 2017-07-10 LAB — POCT INR: INR: 1.6

## 2017-07-10 NOTE — Patient Instructions (Signed)
Description   Spoke with Madeline [name]-pt's dtr & instructed to have pt take 2 and 1/2  tablets (7.5mg )  today March 4th  then change  dose of coumadin to   2 tablets (6mg )  daily except 1 tablet only on  Tuesdays, and Thursdays. Recheck INR in 2 weeks. Call if placed on any new medications [phone]

## 2017-07-17 ENCOUNTER — Encounter (HOSPITAL_COMMUNITY)
Admission: RE | Admit: 2017-07-17 | Discharge: 2017-07-17 | Disposition: A | Discharge status: Home / Self Care | Payer: Medicare Other | Source: Ambulatory Visit | Attending: Nephrology | Admitting: Nephrology

## 2017-07-17 VITALS — BP 135/30 | HR 56 | Temp 98.0°F | Resp 20

## 2017-07-17 DIAGNOSIS — N184 Chronic kidney disease, stage 4 (severe): Secondary | ICD-10-CM | POA: Diagnosis not present

## 2017-07-17 LAB — POCT HEMOGLOBIN-HEMACUE: Hemoglobin: 10.2 g/dL — ABNORMAL LOW (ref 12.0–15.0)

## 2017-07-17 LAB — IRON AND TIBC
IRON: 44 ug/dL (ref 28–170)
Saturation Ratios: 19 % (ref 10.4–31.8)
TIBC: 227 ug/dL — AB (ref 250–450)
UIBC: 183 ug/dL

## 2017-07-17 LAB — FERRITIN: FERRITIN: 421 ng/mL — AB (ref 11–307)

## 2017-07-17 MED ORDER — EPOETIN ALFA 10000 UNIT/ML IJ SOLN
10000.0000 [IU] | INTRAMUSCULAR | Status: DC
  Administered 2017-07-17: 10000 [IU] via SUBCUTANEOUS

## 2017-07-17 MED ORDER — EPOETIN ALFA 10000 UNIT/ML IJ SOLN
INTRAMUSCULAR | Status: AC
  Administered 2017-07-17: 10000 [IU] via SUBCUTANEOUS
  Filled 2017-07-17: qty 1

## 2017-07-24 ENCOUNTER — Ambulatory Visit (INDEPENDENT_AMBULATORY_CARE_PROVIDER_SITE_OTHER): Payer: Medicare Other | Admitting: Internal Medicine

## 2017-07-24 DIAGNOSIS — I4891 Unspecified atrial fibrillation: Secondary | ICD-10-CM

## 2017-07-24 DIAGNOSIS — Z5181 Encounter for therapeutic drug level monitoring: Secondary | ICD-10-CM | POA: Diagnosis not present

## 2017-07-24 LAB — POCT INR: INR: 1.7

## 2017-08-07 ENCOUNTER — Ambulatory Visit (INDEPENDENT_AMBULATORY_CARE_PROVIDER_SITE_OTHER): Payer: Medicare Other | Admitting: Cardiovascular Disease

## 2017-08-07 DIAGNOSIS — M25561 Pain in right knee: Secondary | ICD-10-CM | POA: Diagnosis not present

## 2017-08-07 DIAGNOSIS — M25562 Pain in left knee: Secondary | ICD-10-CM | POA: Diagnosis not present

## 2017-08-07 DIAGNOSIS — Z5181 Encounter for therapeutic drug level monitoring: Secondary | ICD-10-CM | POA: Diagnosis not present

## 2017-08-07 DIAGNOSIS — M1711 Unilateral primary osteoarthritis, right knee: Secondary | ICD-10-CM | POA: Diagnosis not present

## 2017-08-07 DIAGNOSIS — I4891 Unspecified atrial fibrillation: Secondary | ICD-10-CM

## 2017-08-07 DIAGNOSIS — M170 Bilateral primary osteoarthritis of knee: Secondary | ICD-10-CM | POA: Diagnosis not present

## 2017-08-07 DIAGNOSIS — M1712 Unilateral primary osteoarthritis, left knee: Secondary | ICD-10-CM | POA: Diagnosis not present

## 2017-08-07 LAB — POCT INR: INR: 2

## 2017-08-08 ENCOUNTER — Telehealth: Payer: Self-pay | Admitting: *Deleted

## 2017-08-08 DIAGNOSIS — R300 Dysuria: Secondary | ICD-10-CM | POA: Diagnosis not present

## 2017-08-08 NOTE — Telephone Encounter (Signed)
Pt called to inform us that pt is starting today Cipro 500mg  BID for 5 days. Thus, she will check her INR on Friday.

## 2017-08-11 ENCOUNTER — Other Ambulatory Visit (HOSPITAL_COMMUNITY): Payer: Self-pay | Admitting: *Deleted

## 2017-08-11 ENCOUNTER — Ambulatory Visit (INDEPENDENT_AMBULATORY_CARE_PROVIDER_SITE_OTHER): Payer: Medicare Other | Admitting: Cardiology

## 2017-08-11 DIAGNOSIS — Z5181 Encounter for therapeutic drug level monitoring: Secondary | ICD-10-CM | POA: Diagnosis not present

## 2017-08-11 DIAGNOSIS — I4891 Unspecified atrial fibrillation: Secondary | ICD-10-CM

## 2017-08-11 LAB — POCT INR: INR: 1.8

## 2017-08-11 NOTE — Patient Instructions (Signed)
Description   Spoke with Madeline [name]-pt's dtr & instructed to have pt take 2.5 tablets today then continue taking 2 tablets (6mg ) daily except 1 tablet only on Thursdays. Recheck INR in 10 days (check on Mondays). Call if placed on any new medications [phone]

## 2017-08-14 ENCOUNTER — Ambulatory Visit (HOSPITAL_COMMUNITY)
Admission: RE | Admit: 2017-08-14 | Discharge: 2017-08-14 | Disposition: A | Discharge status: Home / Self Care | Payer: Medicare Other | Source: Ambulatory Visit | Attending: Nephrology | Admitting: Nephrology

## 2017-08-14 VITALS — BP 154/35 | HR 53 | Temp 98.3°F | Resp 20

## 2017-08-14 DIAGNOSIS — N184 Chronic kidney disease, stage 4 (severe): Secondary | ICD-10-CM | POA: Insufficient documentation

## 2017-08-14 DIAGNOSIS — D631 Anemia in chronic kidney disease: Secondary | ICD-10-CM | POA: Diagnosis not present

## 2017-08-14 DIAGNOSIS — M1711 Unilateral primary osteoarthritis, right knee: Secondary | ICD-10-CM | POA: Diagnosis not present

## 2017-08-14 DIAGNOSIS — M1712 Unilateral primary osteoarthritis, left knee: Secondary | ICD-10-CM | POA: Diagnosis not present

## 2017-08-14 LAB — FERRITIN: Ferritin: 358 ng/mL — ABNORMAL HIGH (ref 11–307)

## 2017-08-14 LAB — IRON AND TIBC
Iron: 43 ug/dL (ref 28–170)
Saturation Ratios: 18 % (ref 10.4–31.8)
TIBC: 235 ug/dL — ABNORMAL LOW (ref 250–450)
UIBC: 192 ug/dL

## 2017-08-14 LAB — POCT HEMOGLOBIN-HEMACUE: Hemoglobin: 9.9 g/dL — ABNORMAL LOW (ref 12.0–15.0)

## 2017-08-14 MED ORDER — EPOETIN ALFA 10000 UNIT/ML IJ SOLN
INTRAMUSCULAR | Status: AC
  Administered 2017-08-14: 10000 [IU] via SUBCUTANEOUS
  Filled 2017-08-14: qty 1

## 2017-08-14 MED ORDER — EPOETIN ALFA 10000 UNIT/ML IJ SOLN
10000.0000 [IU] | INTRAMUSCULAR | Status: DC
  Administered 2017-08-14: 10000 [IU] via SUBCUTANEOUS

## 2017-08-21 DIAGNOSIS — M1712 Unilateral primary osteoarthritis, left knee: Secondary | ICD-10-CM | POA: Diagnosis not present

## 2017-08-21 DIAGNOSIS — M1711 Unilateral primary osteoarthritis, right knee: Secondary | ICD-10-CM | POA: Diagnosis not present

## 2017-08-24 ENCOUNTER — Ambulatory Visit (INDEPENDENT_AMBULATORY_CARE_PROVIDER_SITE_OTHER): Payer: Medicare Other | Admitting: Cardiovascular Disease

## 2017-08-24 DIAGNOSIS — Z5181 Encounter for therapeutic drug level monitoring: Secondary | ICD-10-CM

## 2017-08-24 DIAGNOSIS — Z7901 Long term (current) use of anticoagulants: Secondary | ICD-10-CM | POA: Diagnosis not present

## 2017-08-24 DIAGNOSIS — I4891 Unspecified atrial fibrillation: Secondary | ICD-10-CM

## 2017-08-24 LAB — POCT INR: INR: 1.8

## 2017-09-01 ENCOUNTER — Ambulatory Visit (INDEPENDENT_AMBULATORY_CARE_PROVIDER_SITE_OTHER): Payer: Medicare Other | Admitting: Internal Medicine

## 2017-09-01 DIAGNOSIS — I4891 Unspecified atrial fibrillation: Secondary | ICD-10-CM

## 2017-09-01 DIAGNOSIS — Z5181 Encounter for therapeutic drug level monitoring: Secondary | ICD-10-CM

## 2017-09-01 LAB — POCT INR: INR: 2

## 2017-09-01 NOTE — Patient Instructions (Signed)
Description   Spoke with Madeline [name]-pt's dtr & instructed to continue same dose of coumadin  2 tablets everyday.  Recheck INR in 2 weeks.  Call if placed on any new medications [phone]

## 2017-09-05 DIAGNOSIS — D631 Anemia in chronic kidney disease: Secondary | ICD-10-CM | POA: Diagnosis not present

## 2017-09-05 DIAGNOSIS — I4891 Unspecified atrial fibrillation: Secondary | ICD-10-CM | POA: Diagnosis not present

## 2017-09-05 DIAGNOSIS — N184 Chronic kidney disease, stage 4 (severe): Secondary | ICD-10-CM | POA: Diagnosis not present

## 2017-09-05 DIAGNOSIS — N2581 Secondary hyperparathyroidism of renal origin: Secondary | ICD-10-CM | POA: Diagnosis not present

## 2017-09-05 DIAGNOSIS — E1122 Type 2 diabetes mellitus with diabetic chronic kidney disease: Secondary | ICD-10-CM | POA: Diagnosis not present

## 2017-09-05 DIAGNOSIS — I129 Hypertensive chronic kidney disease with stage 1 through stage 4 chronic kidney disease, or unspecified chronic kidney disease: Secondary | ICD-10-CM | POA: Diagnosis not present

## 2017-09-11 ENCOUNTER — Ambulatory Visit (HOSPITAL_COMMUNITY)
Admission: RE | Admit: 2017-09-11 | Discharge: 2017-09-11 | Disposition: A | Discharge status: Home / Self Care | Payer: Medicare Other | Source: Ambulatory Visit | Attending: Nephrology | Admitting: Nephrology

## 2017-09-11 VITALS — BP 163/41 | HR 55 | Temp 98.8°F | Resp 20

## 2017-09-11 DIAGNOSIS — N184 Chronic kidney disease, stage 4 (severe): Secondary | ICD-10-CM | POA: Insufficient documentation

## 2017-09-11 DIAGNOSIS — D631 Anemia in chronic kidney disease: Secondary | ICD-10-CM | POA: Diagnosis not present

## 2017-09-11 LAB — IRON AND TIBC
Iron: 43 ug/dL (ref 28–170)
SATURATION RATIOS: 21 % (ref 10.4–31.8)
TIBC: 203 ug/dL — ABNORMAL LOW (ref 250–450)
UIBC: 160 ug/dL

## 2017-09-11 LAB — POCT HEMOGLOBIN-HEMACUE: HEMOGLOBIN: 9.6 g/dL — AB (ref 12.0–15.0)

## 2017-09-11 LAB — FERRITIN: Ferritin: 332 ng/mL — ABNORMAL HIGH (ref 11–307)

## 2017-09-11 MED ORDER — EPOETIN ALFA 10000 UNIT/ML IJ SOLN
INTRAMUSCULAR | Status: AC
  Filled 2017-09-11: qty 1

## 2017-09-11 MED ORDER — EPOETIN ALFA 10000 UNIT/ML IJ SOLN
10000.0000 [IU] | INTRAMUSCULAR | Status: DC
  Administered 2017-09-11: 10000 [IU] via SUBCUTANEOUS

## 2017-09-15 ENCOUNTER — Ambulatory Visit (INDEPENDENT_AMBULATORY_CARE_PROVIDER_SITE_OTHER): Payer: Medicare Other | Admitting: Cardiovascular Disease

## 2017-09-15 DIAGNOSIS — Z5181 Encounter for therapeutic drug level monitoring: Secondary | ICD-10-CM | POA: Diagnosis not present

## 2017-09-15 DIAGNOSIS — I4891 Unspecified atrial fibrillation: Secondary | ICD-10-CM

## 2017-09-15 LAB — POCT INR: INR: 1.5

## 2017-09-22 ENCOUNTER — Ambulatory Visit (INDEPENDENT_AMBULATORY_CARE_PROVIDER_SITE_OTHER): Payer: Medicare Other | Admitting: Cardiology

## 2017-09-22 DIAGNOSIS — Z5181 Encounter for therapeutic drug level monitoring: Secondary | ICD-10-CM

## 2017-09-22 DIAGNOSIS — I4891 Unspecified atrial fibrillation: Secondary | ICD-10-CM

## 2017-09-22 LAB — POCT INR: INR: 1.8

## 2017-09-29 ENCOUNTER — Ambulatory Visit (INDEPENDENT_AMBULATORY_CARE_PROVIDER_SITE_OTHER): Payer: Medicare Other | Admitting: Pharmacist

## 2017-09-29 DIAGNOSIS — I4891 Unspecified atrial fibrillation: Secondary | ICD-10-CM

## 2017-09-29 DIAGNOSIS — Z5181 Encounter for therapeutic drug level monitoring: Secondary | ICD-10-CM

## 2017-09-29 LAB — POCT INR: INR: 2.1 (ref 2.0–3.0)

## 2017-10-03 DIAGNOSIS — R3915 Urgency of urination: Secondary | ICD-10-CM | POA: Diagnosis not present

## 2017-10-05 ENCOUNTER — Telehealth: Payer: Self-pay | Admitting: *Deleted

## 2017-10-05 ENCOUNTER — Ambulatory Visit (INDEPENDENT_AMBULATORY_CARE_PROVIDER_SITE_OTHER): Payer: Medicare Other | Admitting: Pharmacist

## 2017-10-05 DIAGNOSIS — I4891 Unspecified atrial fibrillation: Secondary | ICD-10-CM

## 2017-10-05 DIAGNOSIS — Z5181 Encounter for therapeutic drug level monitoring: Secondary | ICD-10-CM | POA: Diagnosis not present

## 2017-10-05 LAB — POCT INR: INR: 2 (ref 2.0–3.0)

## 2017-10-05 NOTE — Telephone Encounter (Signed)
Madeline [name], pt's dtr, called to report that the pt had to be seen by PCP and started Cipro 500mg  twice a day on Monday for 7 days then once she is done she will start trimethoprim 50mg  daily with no stop date per daughter. Advised dtr that Cipro interacts and we will have to check INR today and she verbalized understanding and will check INR once she gets home today around 4pm.

## 2017-10-09 ENCOUNTER — Ambulatory Visit (HOSPITAL_COMMUNITY)
Admission: RE | Admit: 2017-10-09 | Discharge: 2017-10-09 | Disposition: A | Discharge status: Home / Self Care | Payer: Medicare Other | Source: Ambulatory Visit | Attending: Nephrology | Admitting: Nephrology

## 2017-10-09 VITALS — BP 164/34 | HR 55 | Temp 98.1°F | Resp 20

## 2017-10-09 DIAGNOSIS — D631 Anemia in chronic kidney disease: Secondary | ICD-10-CM | POA: Diagnosis not present

## 2017-10-09 DIAGNOSIS — N184 Chronic kidney disease, stage 4 (severe): Secondary | ICD-10-CM

## 2017-10-09 LAB — POCT HEMOGLOBIN-HEMACUE: Hemoglobin: 10.4 g/dL — ABNORMAL LOW (ref 12.0–15.0)

## 2017-10-09 LAB — IRON AND TIBC
IRON: 47 ug/dL (ref 28–170)
Saturation Ratios: 20 % (ref 10.4–31.8)
TIBC: 238 ug/dL — ABNORMAL LOW (ref 250–450)
UIBC: 191 ug/dL

## 2017-10-09 LAB — FERRITIN: Ferritin: 344 ng/mL — ABNORMAL HIGH (ref 11–307)

## 2017-10-09 MED ORDER — EPOETIN ALFA 10000 UNIT/ML IJ SOLN
INTRAMUSCULAR | Status: AC
  Administered 2017-10-09: 10000 [IU] via SUBCUTANEOUS
  Filled 2017-10-09: qty 1

## 2017-10-09 MED ORDER — EPOETIN ALFA 10000 UNIT/ML IJ SOLN
10000.0000 [IU] | INTRAMUSCULAR | Status: DC
  Administered 2017-10-09: 10000 [IU] via SUBCUTANEOUS

## 2017-10-13 ENCOUNTER — Ambulatory Visit (INDEPENDENT_AMBULATORY_CARE_PROVIDER_SITE_OTHER): Payer: Medicare Other | Admitting: Pharmacist

## 2017-10-13 DIAGNOSIS — I4891 Unspecified atrial fibrillation: Secondary | ICD-10-CM

## 2017-10-13 DIAGNOSIS — Z7901 Long term (current) use of anticoagulants: Secondary | ICD-10-CM | POA: Diagnosis not present

## 2017-10-13 DIAGNOSIS — Z5181 Encounter for therapeutic drug level monitoring: Secondary | ICD-10-CM

## 2017-10-13 LAB — POCT INR: INR: 2.1 (ref 2.0–3.0)

## 2017-10-16 ENCOUNTER — Ambulatory Visit (INDEPENDENT_AMBULATORY_CARE_PROVIDER_SITE_OTHER): Payer: Medicare Other | Admitting: Cardiology

## 2017-10-16 ENCOUNTER — Encounter: Payer: Self-pay | Admitting: Cardiology

## 2017-10-16 VITALS — BP 128/60 | HR 55 | Ht 62.0 in | Wt 205.8 lb

## 2017-10-16 DIAGNOSIS — I5032 Chronic diastolic (congestive) heart failure: Secondary | ICD-10-CM | POA: Diagnosis not present

## 2017-10-16 DIAGNOSIS — I4821 Permanent atrial fibrillation: Secondary | ICD-10-CM

## 2017-10-16 DIAGNOSIS — Z7901 Long term (current) use of anticoagulants: Secondary | ICD-10-CM

## 2017-10-16 DIAGNOSIS — I10 Essential (primary) hypertension: Secondary | ICD-10-CM

## 2017-10-16 DIAGNOSIS — I482 Chronic atrial fibrillation: Secondary | ICD-10-CM | POA: Diagnosis not present
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
## 2017-10-16 NOTE — Patient Instructions (Signed)
Medication Instructions:  The current medical regimen is effective;  continue present plan and medications.   Follow-Up: Follow up in 4 months with Dr. Skains.  You will receive a letter in the mail 2 months before you are due.  Please call us when you receive this letter to schedule your follow up appointment.   If you need a refill on your cardiac medications before your next appointment, please call your pharmacy.  Thank you for choosing [facility]!!      

## 2017-10-24 DIAGNOSIS — R3915 Urgency of urination: Secondary | ICD-10-CM | POA: Diagnosis not present

## 2017-10-28 LAB — POCT INR: INR: 1.8 — AB (ref 2.0–3.0)

## 2017-10-30 ENCOUNTER — Ambulatory Visit (INDEPENDENT_AMBULATORY_CARE_PROVIDER_SITE_OTHER): Payer: Medicare Other | Admitting: Cardiovascular Disease

## 2017-10-30 DIAGNOSIS — I482 Chronic atrial fibrillation: Secondary | ICD-10-CM | POA: Diagnosis not present

## 2017-10-30 DIAGNOSIS — I4821 Permanent atrial fibrillation: Secondary | ICD-10-CM

## 2017-10-30 DIAGNOSIS — Z5181 Encounter for therapeutic drug level monitoring: Secondary | ICD-10-CM

## 2017-11-03 ENCOUNTER — Other Ambulatory Visit: Payer: Self-pay | Admitting: Cardiology

## 2017-11-06 ENCOUNTER — Ambulatory Visit (INDEPENDENT_AMBULATORY_CARE_PROVIDER_SITE_OTHER): Payer: Medicare Other | Admitting: Interventional Cardiology

## 2017-11-06 ENCOUNTER — Ambulatory Visit (HOSPITAL_COMMUNITY)
Admission: RE | Admit: 2017-11-06 | Discharge: 2017-11-06 | Disposition: A | Discharge status: Home / Self Care | Payer: Medicare Other | Source: Ambulatory Visit | Attending: Nephrology | Admitting: Nephrology

## 2017-11-06 VITALS — BP 159/42 | HR 58 | Temp 98.3°F | Ht 62.0 in | Wt 202.0 lb

## 2017-11-06 DIAGNOSIS — D631 Anemia in chronic kidney disease: Secondary | ICD-10-CM | POA: Diagnosis not present

## 2017-11-06 DIAGNOSIS — I482 Chronic atrial fibrillation: Secondary | ICD-10-CM | POA: Diagnosis not present

## 2017-11-06 DIAGNOSIS — Z5181 Encounter for therapeutic drug level monitoring: Secondary | ICD-10-CM | POA: Diagnosis not present

## 2017-11-06 DIAGNOSIS — N184 Chronic kidney disease, stage 4 (severe): Secondary | ICD-10-CM | POA: Insufficient documentation

## 2017-11-06 DIAGNOSIS — I4821 Permanent atrial fibrillation: Secondary | ICD-10-CM

## 2017-11-06 LAB — IRON AND TIBC
Iron: 39 ug/dL (ref 28–170)
Saturation Ratios: 16 % (ref 10.4–31.8)
TIBC: 237 ug/dL — ABNORMAL LOW (ref 250–450)
UIBC: 198 ug/dL

## 2017-11-06 LAB — FERRITIN: FERRITIN: 299 ng/mL (ref 11–307)

## 2017-11-06 LAB — POCT INR: INR: 2.4 (ref 2.0–3.0)

## 2017-11-06 LAB — POCT HEMOGLOBIN-HEMACUE: HEMOGLOBIN: 10 g/dL — AB (ref 12.0–15.0)

## 2017-11-06 MED ORDER — EPOETIN ALFA 10000 UNIT/ML IJ SOLN
INTRAMUSCULAR | Status: AC
  Filled 2017-11-06: qty 1

## 2017-11-06 MED ORDER — EPOETIN ALFA 10000 UNIT/ML IJ SOLN
10000.0000 [IU] | INTRAMUSCULAR | Status: DC
  Administered 2017-11-06: 10000 [IU] via SUBCUTANEOUS

## 2017-11-06 NOTE — Patient Instructions (Signed)
Description   Spoke with Madeline [name]-pt's dtr & instructed to  continue 2 tablets everyday except 3 tablets on Mondays and Fridays.  Recheck INR in 2 weeks.  Call if placed on any new medications [phone].

## 2017-11-15 DIAGNOSIS — N184 Chronic kidney disease, stage 4 (severe): Secondary | ICD-10-CM | POA: Diagnosis not present

## 2017-11-20 ENCOUNTER — Ambulatory Visit (INDEPENDENT_AMBULATORY_CARE_PROVIDER_SITE_OTHER): Payer: Medicare Other | Admitting: Cardiology

## 2017-11-20 DIAGNOSIS — Z5181 Encounter for therapeutic drug level monitoring: Secondary | ICD-10-CM

## 2017-11-20 DIAGNOSIS — I4821 Permanent atrial fibrillation: Secondary | ICD-10-CM

## 2017-11-20 DIAGNOSIS — I482 Chronic atrial fibrillation: Secondary | ICD-10-CM

## 2017-11-20 LAB — POCT INR: INR: 1.8 — AB (ref 2.0–3.0)

## 2017-11-20 NOTE — Patient Instructions (Signed)
Description   Spoke with [name]-pt's dtr & instructed to have pt take 3 and 1/2 tablets (10.5mg ) today July 15th then  continue 2 tablets everyday except 3 tablets on Mondays and Fridays.  Recheck INR in 2 weeks.  Call if placed on any new medications [phone].

## 2017-12-04 ENCOUNTER — Ambulatory Visit (INDEPENDENT_AMBULATORY_CARE_PROVIDER_SITE_OTHER): Payer: Medicare Other | Admitting: Internal Medicine

## 2017-12-04 ENCOUNTER — Ambulatory Visit (HOSPITAL_COMMUNITY)
Admission: RE | Admit: 2017-12-04 | Discharge: 2017-12-04 | Disposition: A | Discharge status: Home / Self Care | Payer: Medicare Other | Source: Ambulatory Visit | Attending: Nephrology | Admitting: Nephrology

## 2017-12-04 VITALS — BP 153/58 | HR 54 | Temp 99.8°F

## 2017-12-04 DIAGNOSIS — D631 Anemia in chronic kidney disease: Secondary | ICD-10-CM | POA: Insufficient documentation

## 2017-12-04 DIAGNOSIS — Z79899 Other long term (current) drug therapy: Secondary | ICD-10-CM | POA: Insufficient documentation

## 2017-12-04 DIAGNOSIS — N184 Chronic kidney disease, stage 4 (severe): Secondary | ICD-10-CM | POA: Insufficient documentation

## 2017-12-04 DIAGNOSIS — Z5181 Encounter for therapeutic drug level monitoring: Secondary | ICD-10-CM | POA: Insufficient documentation

## 2017-12-04 DIAGNOSIS — Z7901 Long term (current) use of anticoagulants: Secondary | ICD-10-CM | POA: Diagnosis not present

## 2017-12-04 LAB — POCT HEMOGLOBIN-HEMACUE: HEMOGLOBIN: 9.6 g/dL — AB (ref 12.0–15.0)

## 2017-12-04 LAB — IRON AND TIBC
Iron: 45 ug/dL (ref 28–170)
Saturation Ratios: 18 % (ref 10.4–31.8)
TIBC: 244 ug/dL — AB (ref 250–450)
UIBC: 199 ug/dL

## 2017-12-04 LAB — FERRITIN: Ferritin: 254 ng/mL (ref 11–307)

## 2017-12-04 LAB — POCT INR: INR: 2.4 (ref 2.0–3.0)

## 2017-12-04 MED ORDER — EPOETIN ALFA 10000 UNIT/ML IJ SOLN
10000.0000 [IU] | INTRAMUSCULAR | Status: DC
  Administered 2017-12-04: 10000 [IU] via SUBCUTANEOUS

## 2017-12-04 MED ORDER — EPOETIN ALFA 10000 UNIT/ML IJ SOLN
INTRAMUSCULAR | Status: AC
  Filled 2017-12-04: qty 1

## 2017-12-19 ENCOUNTER — Ambulatory Visit (INDEPENDENT_AMBULATORY_CARE_PROVIDER_SITE_OTHER): Payer: Medicare Other

## 2017-12-19 DIAGNOSIS — I4891 Unspecified atrial fibrillation: Secondary | ICD-10-CM

## 2017-12-19 DIAGNOSIS — Z5181 Encounter for therapeutic drug level monitoring: Secondary | ICD-10-CM | POA: Diagnosis not present

## 2017-12-19 LAB — POCT INR: INR: 2.5 (ref 2.0–3.0)

## 2017-12-19 NOTE — Patient Instructions (Signed)
Description   Spoke with [name]-pt's dtr & instructed to have continue 2 tablets every day except 3 tablets on Mondays and Fridays.  Recheck INR in 2 weeks.  Call if placed on any new medications [phone].

## 2017-12-27 ENCOUNTER — Other Ambulatory Visit: Payer: Self-pay | Admitting: Cardiology

## 2018-01-01 ENCOUNTER — Ambulatory Visit (INDEPENDENT_AMBULATORY_CARE_PROVIDER_SITE_OTHER): Payer: Medicare Other | Admitting: Cardiovascular Disease

## 2018-01-01 ENCOUNTER — Ambulatory Visit (HOSPITAL_COMMUNITY)
Admission: RE | Admit: 2018-01-01 | Discharge: 2018-01-01 | Disposition: A | Discharge status: Home / Self Care | Payer: Medicare Other | Source: Ambulatory Visit | Attending: Nephrology | Admitting: Nephrology

## 2018-01-01 VITALS — BP 160/54 | HR 53 | Temp 98.0°F | Ht 62.0 in | Wt 200.0 lb

## 2018-01-01 DIAGNOSIS — D631 Anemia in chronic kidney disease: Secondary | ICD-10-CM | POA: Insufficient documentation

## 2018-01-01 DIAGNOSIS — Z5181 Encounter for therapeutic drug level monitoring: Secondary | ICD-10-CM

## 2018-01-01 DIAGNOSIS — N184 Chronic kidney disease, stage 4 (severe): Secondary | ICD-10-CM | POA: Diagnosis not present

## 2018-01-01 DIAGNOSIS — I4891 Unspecified atrial fibrillation: Secondary | ICD-10-CM | POA: Diagnosis not present

## 2018-01-01 LAB — IRON AND TIBC
Iron: 48 ug/dL (ref 28–170)
SATURATION RATIOS: 21 % (ref 10.4–31.8)
TIBC: 230 ug/dL — AB (ref 250–450)
UIBC: 182 ug/dL

## 2018-01-01 LAB — FERRITIN: Ferritin: 277 ng/mL (ref 11–307)

## 2018-01-01 LAB — POCT HEMOGLOBIN-HEMACUE: HEMOGLOBIN: 10.6 g/dL — AB (ref 12.0–15.0)

## 2018-01-01 LAB — POCT INR: INR: 2.5 (ref 2.0–3.0)

## 2018-01-01 MED ORDER — EPOETIN ALFA 10000 UNIT/ML IJ SOLN
10000.0000 [IU] | INTRAMUSCULAR | Status: DC
  Administered 2018-01-01: 10000 [IU] via SUBCUTANEOUS

## 2018-01-01 MED ORDER — EPOETIN ALFA 10000 UNIT/ML IJ SOLN
INTRAMUSCULAR | Status: AC
  Filled 2018-01-01: qty 1

## 2018-01-11 DIAGNOSIS — D0472 Carcinoma in situ of skin of left lower limb, including hip: Secondary | ICD-10-CM | POA: Diagnosis not present

## 2018-01-11 DIAGNOSIS — D0462 Carcinoma in situ of skin of left upper limb, including shoulder: Secondary | ICD-10-CM | POA: Diagnosis not present

## 2018-01-11 DIAGNOSIS — D485 Neoplasm of uncertain behavior of skin: Secondary | ICD-10-CM | POA: Diagnosis not present

## 2018-01-11 DIAGNOSIS — D0461 Carcinoma in situ of skin of right upper limb, including shoulder: Secondary | ICD-10-CM | POA: Diagnosis not present

## 2018-01-12 ENCOUNTER — Telehealth: Payer: Self-pay | Admitting: Cardiology

## 2018-01-12 NOTE — Telephone Encounter (Signed)
Spoke patient's daughter, per DPR, her mother went to a dermatologist appointment and was prescribed imiquimod 5% cream to apply to hands and feet cancerous sores. The daughter was concerned about drug interactions with her cardiac medication. Upon viewing [facility], the medication has no known drug interactions. Advised patient's daughter that the medication would not interact and if any questions to call our office back.

## 2018-01-12 NOTE — Telephone Encounter (Signed)
New message:   Patient daughter calling because she has some concerns about some medication that the dermatoloist prescribed.

## 2018-01-15 ENCOUNTER — Ambulatory Visit (INDEPENDENT_AMBULATORY_CARE_PROVIDER_SITE_OTHER): Payer: Medicare Other

## 2018-01-15 DIAGNOSIS — I4891 Unspecified atrial fibrillation: Secondary | ICD-10-CM | POA: Diagnosis not present

## 2018-01-15 DIAGNOSIS — Z7901 Long term (current) use of anticoagulants: Secondary | ICD-10-CM | POA: Diagnosis not present

## 2018-01-15 DIAGNOSIS — Z5181 Encounter for therapeutic drug level monitoring: Secondary | ICD-10-CM | POA: Diagnosis not present

## 2018-01-15 LAB — POCT INR: INR: 3 (ref 2.0–3.0)

## 2018-01-15 NOTE — Patient Instructions (Signed)
Description   Spoke with [name]-pt's dtr & instructed to continue on same dosage 2 tablets every day except 3 tablets on Mondays and Fridays.  Recheck INR in 2 weeks.  Call if placed on any new medications [phone].

## 2018-01-29 ENCOUNTER — Ambulatory Visit (INDEPENDENT_AMBULATORY_CARE_PROVIDER_SITE_OTHER): Payer: Medicare Other | Admitting: Cardiology

## 2018-01-29 ENCOUNTER — Ambulatory Visit (HOSPITAL_COMMUNITY)
Admission: RE | Admit: 2018-01-29 | Discharge: 2018-01-29 | Disposition: A | Discharge status: Home / Self Care | Payer: Medicare Other | Source: Ambulatory Visit | Attending: Nephrology | Admitting: Nephrology

## 2018-01-29 VITALS — BP 143/46 | HR 52 | Temp 98.4°F | Resp 20

## 2018-01-29 DIAGNOSIS — Z5181 Encounter for therapeutic drug level monitoring: Secondary | ICD-10-CM | POA: Diagnosis not present

## 2018-01-29 DIAGNOSIS — I4891 Unspecified atrial fibrillation: Secondary | ICD-10-CM | POA: Diagnosis not present

## 2018-01-29 DIAGNOSIS — D631 Anemia in chronic kidney disease: Secondary | ICD-10-CM | POA: Insufficient documentation

## 2018-01-29 DIAGNOSIS — N184 Chronic kidney disease, stage 4 (severe): Secondary | ICD-10-CM | POA: Diagnosis not present

## 2018-01-29 LAB — IRON AND TIBC
IRON: 47 ug/dL (ref 28–170)
Saturation Ratios: 20 % (ref 10.4–31.8)
TIBC: 232 ug/dL — ABNORMAL LOW (ref 250–450)
UIBC: 185 ug/dL

## 2018-01-29 LAB — POCT HEMOGLOBIN-HEMACUE: Hemoglobin: 10 g/dL — ABNORMAL LOW (ref 12.0–15.0)

## 2018-01-29 LAB — POCT INR: INR: 3.5 — AB (ref 2.0–3.0)

## 2018-01-29 LAB — FERRITIN: Ferritin: 1060 ng/mL — ABNORMAL HIGH (ref 11–307)

## 2018-01-29 MED ORDER — EPOETIN ALFA 10000 UNIT/ML IJ SOLN
INTRAMUSCULAR | Status: AC
  Filled 2018-01-29: qty 1

## 2018-01-29 MED ORDER — EPOETIN ALFA 10000 UNIT/ML IJ SOLN
10000.0000 [IU] | INTRAMUSCULAR | Status: DC
  Administered 2018-01-29: 10000 [IU] via SUBCUTANEOUS

## 2018-01-29 NOTE — Patient Instructions (Signed)
Description   Spoke with [name]-pt's dtr & instructed to hold today's dose then continue on same dosage 2 tablets every day except 3 tablets on Mondays and Fridays.  Recheck INR in 2 weeks.  Call if placed on any new medications [phone].

## 2018-02-09 DIAGNOSIS — E1122 Type 2 diabetes mellitus with diabetic chronic kidney disease: Secondary | ICD-10-CM | POA: Diagnosis not present

## 2018-02-09 DIAGNOSIS — N390 Urinary tract infection, site not specified: Secondary | ICD-10-CM | POA: Diagnosis not present

## 2018-02-09 DIAGNOSIS — E1142 Type 2 diabetes mellitus with diabetic polyneuropathy: Secondary | ICD-10-CM | POA: Diagnosis not present

## 2018-02-09 DIAGNOSIS — Z9981 Dependence on supplemental oxygen: Secondary | ICD-10-CM | POA: Diagnosis not present

## 2018-02-09 DIAGNOSIS — Z0000 Encounter for general adult medical examination without abnormal findings: Secondary | ICD-10-CM | POA: Diagnosis not present

## 2018-02-09 DIAGNOSIS — N184 Chronic kidney disease, stage 4 (severe): Secondary | ICD-10-CM | POA: Diagnosis not present

## 2018-02-09 DIAGNOSIS — I130 Hypertensive heart and chronic kidney disease with heart failure and stage 1 through stage 4 chronic kidney disease, or unspecified chronic kidney disease: Secondary | ICD-10-CM | POA: Diagnosis not present

## 2018-02-09 DIAGNOSIS — E890 Postprocedural hypothyroidism: Secondary | ICD-10-CM | POA: Diagnosis not present

## 2018-02-09 DIAGNOSIS — I5032 Chronic diastolic (congestive) heart failure: Secondary | ICD-10-CM | POA: Diagnosis not present

## 2018-02-09 DIAGNOSIS — Z7901 Long term (current) use of anticoagulants: Secondary | ICD-10-CM | POA: Diagnosis not present

## 2018-02-09 DIAGNOSIS — I482 Chronic atrial fibrillation, unspecified: Secondary | ICD-10-CM | POA: Diagnosis not present

## 2018-02-12 ENCOUNTER — Ambulatory Visit (INDEPENDENT_AMBULATORY_CARE_PROVIDER_SITE_OTHER): Payer: Medicare Other

## 2018-02-12 DIAGNOSIS — Z5181 Encounter for therapeutic drug level monitoring: Secondary | ICD-10-CM

## 2018-02-12 DIAGNOSIS — I4891 Unspecified atrial fibrillation: Secondary | ICD-10-CM | POA: Diagnosis not present

## 2018-02-12 LAB — POCT INR: INR: 2.4 (ref 2.0–3.0)

## 2018-02-12 NOTE — Patient Instructions (Signed)
Description   Spoke with [name]-pt's dtr & instructed to continue on same dosage 2 tablets every day except 3 tablets on Mondays and Fridays.  Recheck INR in 2 weeks.  Call if placed on any new medications [phone].

## 2018-02-15 ENCOUNTER — Encounter: Payer: Self-pay | Admitting: Cardiology

## 2018-02-15 ENCOUNTER — Ambulatory Visit (INDEPENDENT_AMBULATORY_CARE_PROVIDER_SITE_OTHER): Payer: Medicare Other | Admitting: Cardiology

## 2018-02-15 VITALS — BP 144/70 | HR 54 | Ht 62.0 in | Wt 203.0 lb

## 2018-02-15 DIAGNOSIS — I10 Essential (primary) hypertension: Secondary | ICD-10-CM | POA: Diagnosis not present

## 2018-02-15 DIAGNOSIS — N184 Chronic kidney disease, stage 4 (severe): Secondary | ICD-10-CM | POA: Diagnosis not present

## 2018-02-15 DIAGNOSIS — E119 Type 2 diabetes mellitus without complications: Secondary | ICD-10-CM | POA: Diagnosis not present

## 2018-02-15 DIAGNOSIS — Z7901 Long term (current) use of anticoagulants: Secondary | ICD-10-CM | POA: Diagnosis not present

## 2018-02-15 DIAGNOSIS — I5032 Chronic diastolic (congestive) heart failure: Secondary | ICD-10-CM | POA: Diagnosis not present

## 2018-02-15 DIAGNOSIS — I700 Atherosclerosis of aorta: Secondary | ICD-10-CM | POA: Diagnosis not present

## 2018-02-15 DIAGNOSIS — I4821 Permanent atrial fibrillation: Secondary | ICD-10-CM

## 2018-02-15 DIAGNOSIS — Z23 Encounter for immunization: Secondary | ICD-10-CM | POA: Diagnosis not present
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
## 2018-02-15 NOTE — Patient Instructions (Signed)
Medication Instructions:   Your physician recommends that you continue on your current medications as directed. Please refer to the Current Medication list given to you today.  If you need a refill on your cardiac medications before your next appointment, please call your pharmacy.      Follow-Up: At [facility], you and your health needs are our priority.  As part of our continuing mission to provide you with exceptional heart care, we have created designated Provider Care Teams.  These Care Teams include your primary Cardiologist (physician) and Advanced Practice Providers (APPs -  Physician Assistants and Nurse Practitioners) who all work together to provide you with the care you need, when you need it. You will need a follow up appointment in 6 months with Dr. Marlou Porch  Please call our office 2 months in advance to schedule this appointment.  You may see No primary care provider on file. or one of the following Advanced Practice Providers on your designated Care Team:   Truitt Merle, NP Cecilie Kicks, NP . Kathyrn Drown, NP

## 2018-02-16 DIAGNOSIS — E1122 Type 2 diabetes mellitus with diabetic chronic kidney disease: Secondary | ICD-10-CM | POA: Diagnosis not present

## 2018-02-16 DIAGNOSIS — I129 Hypertensive chronic kidney disease with stage 1 through stage 4 chronic kidney disease, or unspecified chronic kidney disease: Secondary | ICD-10-CM | POA: Diagnosis not present

## 2018-02-16 DIAGNOSIS — D631 Anemia in chronic kidney disease: Secondary | ICD-10-CM | POA: Diagnosis not present

## 2018-02-16 DIAGNOSIS — N184 Chronic kidney disease, stage 4 (severe): Secondary | ICD-10-CM | POA: Diagnosis not present

## 2018-02-16 DIAGNOSIS — I4891 Unspecified atrial fibrillation: Secondary | ICD-10-CM | POA: Diagnosis not present

## 2018-02-16 DIAGNOSIS — N2581 Secondary hyperparathyroidism of renal origin: Secondary | ICD-10-CM | POA: Diagnosis not present

## 2018-02-22 ENCOUNTER — Other Ambulatory Visit: Payer: Self-pay | Admitting: Cardiology

## 2018-02-26 ENCOUNTER — Ambulatory Visit (HOSPITAL_COMMUNITY)
Admission: RE | Admit: 2018-02-26 | Discharge: 2018-02-26 | Disposition: A | Discharge status: Home / Self Care | Payer: Medicare Other | Source: Ambulatory Visit | Attending: Nephrology | Admitting: Nephrology

## 2018-02-26 ENCOUNTER — Ambulatory Visit (INDEPENDENT_AMBULATORY_CARE_PROVIDER_SITE_OTHER): Payer: Medicare Other | Admitting: Internal Medicine

## 2018-02-26 VITALS — BP 150/53 | HR 55 | Temp 97.9°F | Resp 18

## 2018-02-26 DIAGNOSIS — Z5181 Encounter for therapeutic drug level monitoring: Secondary | ICD-10-CM

## 2018-02-26 DIAGNOSIS — N184 Chronic kidney disease, stage 4 (severe): Secondary | ICD-10-CM | POA: Diagnosis not present

## 2018-02-26 DIAGNOSIS — D631 Anemia in chronic kidney disease: Secondary | ICD-10-CM | POA: Diagnosis not present

## 2018-02-26 DIAGNOSIS — I4891 Unspecified atrial fibrillation: Secondary | ICD-10-CM | POA: Diagnosis not present

## 2018-02-26 LAB — POCT INR: INR: 3.6 — AB (ref 2.0–3.0)

## 2018-02-26 LAB — IRON AND TIBC
IRON: 30 ug/dL (ref 28–170)
Saturation Ratios: 13 % (ref 10.4–31.8)
TIBC: 224 ug/dL — ABNORMAL LOW (ref 250–450)
UIBC: 194 ug/dL

## 2018-02-26 LAB — FERRITIN: FERRITIN: 285 ng/mL (ref 11–307)

## 2018-02-26 LAB — POCT HEMOGLOBIN-HEMACUE: HEMOGLOBIN: 9.6 g/dL — AB (ref 12.0–15.0)

## 2018-02-26 MED ORDER — EPOETIN ALFA 10000 UNIT/ML IJ SOLN
INTRAMUSCULAR | Status: AC
  Administered 2018-02-26: 10000 [IU]
  Filled 2018-02-26: qty 1

## 2018-02-26 MED ORDER — EPOETIN ALFA 10000 UNIT/ML IJ SOLN: 10000.0000 [IU] | INTRAMUSCULAR | Status: DC

## 2018-02-26 NOTE — Patient Instructions (Signed)
Description   Spoke with [name]-pt's dtr & instructed to hold today's dose then  continue on same dosage 2 tablets every day except 3 tablets on Mondays and Fridays.  Recheck INR in 2 weeks.  Call if placed on any new medications [phone].

## 2018-03-09 DIAGNOSIS — M170 Bilateral primary osteoarthritis of knee: Secondary | ICD-10-CM | POA: Diagnosis not present

## 2018-03-12 ENCOUNTER — Ambulatory Visit (INDEPENDENT_AMBULATORY_CARE_PROVIDER_SITE_OTHER): Payer: Medicare Other | Admitting: Internal Medicine

## 2018-03-12 DIAGNOSIS — I4891 Unspecified atrial fibrillation: Secondary | ICD-10-CM | POA: Diagnosis not present

## 2018-03-12 DIAGNOSIS — Z7901 Long term (current) use of anticoagulants: Secondary | ICD-10-CM | POA: Diagnosis not present

## 2018-03-12 DIAGNOSIS — Z5181 Encounter for therapeutic drug level monitoring: Secondary | ICD-10-CM | POA: Diagnosis not present

## 2018-03-12 LAB — POCT INR: INR: 3.1 — AB (ref 2.0–3.0)

## 2018-03-16 DIAGNOSIS — M170 Bilateral primary osteoarthritis of knee: Secondary | ICD-10-CM | POA: Diagnosis not present

## 2018-03-23 ENCOUNTER — Other Ambulatory Visit (HOSPITAL_COMMUNITY): Payer: Self-pay

## 2018-03-23 DIAGNOSIS — M170 Bilateral primary osteoarthritis of knee: Secondary | ICD-10-CM | POA: Diagnosis not present

## 2018-03-26 ENCOUNTER — Ambulatory Visit (HOSPITAL_COMMUNITY)
Admission: RE | Admit: 2018-03-26 | Discharge: 2018-03-26 | Disposition: A | Discharge status: Home / Self Care | Payer: Medicare Other | Source: Ambulatory Visit | Attending: Nephrology | Admitting: Nephrology

## 2018-03-26 ENCOUNTER — Ambulatory Visit (INDEPENDENT_AMBULATORY_CARE_PROVIDER_SITE_OTHER): Payer: Medicare Other

## 2018-03-26 VITALS — BP 163/51 | HR 55 | Temp 97.7°F | Resp 20

## 2018-03-26 DIAGNOSIS — Z5181 Encounter for therapeutic drug level monitoring: Secondary | ICD-10-CM | POA: Diagnosis not present

## 2018-03-26 DIAGNOSIS — N184 Chronic kidney disease, stage 4 (severe): Secondary | ICD-10-CM | POA: Diagnosis not present

## 2018-03-26 DIAGNOSIS — I4891 Unspecified atrial fibrillation: Secondary | ICD-10-CM

## 2018-03-26 LAB — IRON AND TIBC
Iron: 35 ug/dL (ref 28–170)
Saturation Ratios: 18 % (ref 10.4–31.8)
TIBC: 196 ug/dL — ABNORMAL LOW (ref 250–450)
UIBC: 161 ug/dL

## 2018-03-26 LAB — POCT INR: INR: 3.1 — AB (ref 2.0–3.0)

## 2018-03-26 LAB — FERRITIN: Ferritin: 414 ng/mL — ABNORMAL HIGH (ref 11–307)

## 2018-03-26 MED ORDER — EPOETIN ALFA 10000 UNIT/ML IJ SOLN
INTRAMUSCULAR | Status: AC
  Filled 2018-03-26: qty 1

## 2018-03-26 MED ORDER — EPOETIN ALFA 10000 UNIT/ML IJ SOLN
10000.0000 [IU] | INTRAMUSCULAR | Status: DC
  Administered 2018-03-26: 10000 [IU] via SUBCUTANEOUS

## 2018-03-26 NOTE — Patient Instructions (Signed)
Description   Spoke with [name]-pt's dtr & instructed to skip today's dosage of Coumadin, then resume same dosage 2 tablets every day except 3 tablets on Mondays and Fridays.  Recheck INR in 2 weeks.  Call if placed on any new medications [phone].

## 2018-03-27 LAB — POCT HEMOGLOBIN-HEMACUE: Hemoglobin: 10.3 g/dL — ABNORMAL LOW (ref 12.0–15.0)

## 2018-03-28 DIAGNOSIS — E890 Postprocedural hypothyroidism: Secondary | ICD-10-CM | POA: Diagnosis not present

## 2018-04-09 ENCOUNTER — Ambulatory Visit (INDEPENDENT_AMBULATORY_CARE_PROVIDER_SITE_OTHER): Payer: Medicare Other | Admitting: Cardiovascular Disease

## 2018-04-09 DIAGNOSIS — I4891 Unspecified atrial fibrillation: Secondary | ICD-10-CM

## 2018-04-09 DIAGNOSIS — Z5181 Encounter for therapeutic drug level monitoring: Secondary | ICD-10-CM

## 2018-04-09 LAB — POCT INR: INR: 3.3 — AB (ref 2.0–3.0)

## 2018-04-23 ENCOUNTER — Ambulatory Visit (INDEPENDENT_AMBULATORY_CARE_PROVIDER_SITE_OTHER): Payer: Medicare Other | Admitting: Internal Medicine

## 2018-04-23 ENCOUNTER — Ambulatory Visit (HOSPITAL_COMMUNITY)
Admission: RE | Admit: 2018-04-23 | Discharge: 2018-04-23 | Disposition: A | Discharge status: Home / Self Care | Payer: Medicare Other | Source: Ambulatory Visit | Attending: Nephrology | Admitting: Nephrology

## 2018-04-23 VITALS — BP 155/42 | HR 57 | Temp 98.5°F | Resp 20

## 2018-04-23 DIAGNOSIS — I4891 Unspecified atrial fibrillation: Secondary | ICD-10-CM

## 2018-04-23 DIAGNOSIS — Z5181 Encounter for therapeutic drug level monitoring: Secondary | ICD-10-CM

## 2018-04-23 DIAGNOSIS — N184 Chronic kidney disease, stage 4 (severe): Secondary | ICD-10-CM | POA: Insufficient documentation

## 2018-04-23 LAB — IRON AND TIBC
IRON: 36 ug/dL (ref 28–170)
SATURATION RATIOS: 18 % (ref 10.4–31.8)
TIBC: 204 ug/dL — ABNORMAL LOW (ref 250–450)
UIBC: 168 ug/dL

## 2018-04-23 LAB — FERRITIN: Ferritin: 372 ng/mL — ABNORMAL HIGH (ref 11–307)

## 2018-04-23 LAB — POCT INR: INR: 3.3 — AB (ref 2.0–3.0)

## 2018-04-23 LAB — POCT HEMOGLOBIN-HEMACUE: Hemoglobin: 9.9 g/dL — ABNORMAL LOW (ref 12.0–15.0)

## 2018-04-23 MED ORDER — EPOETIN ALFA 10000 UNIT/ML IJ SOLN
10000.0000 [IU] | INTRAMUSCULAR | Status: DC
  Administered 2018-04-23: 10000 [IU] via SUBCUTANEOUS

## 2018-04-23 MED ORDER — EPOETIN ALFA 10000 UNIT/ML IJ SOLN
INTRAMUSCULAR | Status: AC
  Filled 2018-04-23: qty 1

## 2018-04-24 ENCOUNTER — Encounter (HOSPITAL_BASED_OUTPATIENT_CLINIC_OR_DEPARTMENT_OTHER): Payer: Self-pay | Admitting: *Deleted

## 2018-04-24 ENCOUNTER — Emergency Department (HOSPITAL_BASED_OUTPATIENT_CLINIC_OR_DEPARTMENT_OTHER)
Admission: EM | Admit: 2018-04-24 | Discharge: 2018-04-24 | Disposition: A | Discharge status: Home / Self Care | Payer: Medicare Other | Attending: Emergency Medicine | Admitting: Emergency Medicine

## 2018-04-24 ENCOUNTER — Emergency Department (HOSPITAL_BASED_OUTPATIENT_CLINIC_OR_DEPARTMENT_OTHER): Payer: Medicare Other

## 2018-04-24 ENCOUNTER — Other Ambulatory Visit: Payer: Self-pay

## 2018-04-24 DIAGNOSIS — Z79899 Other long term (current) drug therapy: Secondary | ICD-10-CM | POA: Diagnosis not present

## 2018-04-24 DIAGNOSIS — Z7984 Long term (current) use of oral hypoglycemic drugs: Secondary | ICD-10-CM | POA: Insufficient documentation

## 2018-04-24 DIAGNOSIS — Z87891 Personal history of nicotine dependence: Secondary | ICD-10-CM | POA: Insufficient documentation

## 2018-04-24 DIAGNOSIS — Z85828 Personal history of other malignant neoplasm of skin: Secondary | ICD-10-CM | POA: Diagnosis not present

## 2018-04-24 DIAGNOSIS — R531 Weakness: Secondary | ICD-10-CM | POA: Diagnosis not present

## 2018-04-24 DIAGNOSIS — R0781 Pleurodynia: Secondary | ICD-10-CM

## 2018-04-24 DIAGNOSIS — I5040 Unspecified combined systolic (congestive) and diastolic (congestive) heart failure: Secondary | ICD-10-CM | POA: Insufficient documentation

## 2018-04-24 DIAGNOSIS — R0789 Other chest pain: Secondary | ICD-10-CM | POA: Insufficient documentation

## 2018-04-24 DIAGNOSIS — W19XXXA Unspecified fall, initial encounter: Secondary | ICD-10-CM

## 2018-04-24 DIAGNOSIS — E039 Hypothyroidism, unspecified: Secondary | ICD-10-CM | POA: Diagnosis not present

## 2018-04-24 DIAGNOSIS — I130 Hypertensive heart and chronic kidney disease with heart failure and stage 1 through stage 4 chronic kidney disease, or unspecified chronic kidney disease: Secondary | ICD-10-CM | POA: Diagnosis not present

## 2018-04-24 DIAGNOSIS — N184 Chronic kidney disease, stage 4 (severe): Secondary | ICD-10-CM | POA: Insufficient documentation

## 2018-04-24 DIAGNOSIS — Z7901 Long term (current) use of anticoagulants: Secondary | ICD-10-CM | POA: Insufficient documentation

## 2018-04-24 DIAGNOSIS — E1122 Type 2 diabetes mellitus with diabetic chronic kidney disease: Secondary | ICD-10-CM | POA: Diagnosis not present

## 2018-04-24 DIAGNOSIS — W0110XA Fall on same level from slipping, tripping and stumbling with subsequent striking against unspecified object, initial encounter: Secondary | ICD-10-CM | POA: Insufficient documentation

## 2018-04-24 DIAGNOSIS — S299XXA Unspecified injury of thorax, initial encounter: Secondary | ICD-10-CM | POA: Diagnosis not present

## 2018-04-24 DIAGNOSIS — S0990XA Unspecified injury of head, initial encounter: Secondary | ICD-10-CM | POA: Diagnosis not present

## 2018-04-24 LAB — COMPREHENSIVE METABOLIC PANEL
ALBUMIN: 3.3 g/dL — AB (ref 3.5–5.0)
ALT: 19 U/L (ref 0–44)
AST: 24 U/L (ref 15–41)
Alkaline Phosphatase: 73 U/L (ref 38–126)
Anion gap: 11 (ref 5–15)
BUN: 43 mg/dL — ABNORMAL HIGH (ref 8–23)
CO2: 28 mmol/L (ref 22–32)
Calcium: 9.3 mg/dL (ref 8.9–10.3)
Chloride: 96 mmol/L — ABNORMAL LOW (ref 98–111)
Creatinine, Ser: 2.4 mg/dL — ABNORMAL HIGH (ref 0.44–1.00)
GFR calc Af Amer: 20 mL/min — ABNORMAL LOW (ref >60)
GFR calc non Af Amer: 17 mL/min — ABNORMAL LOW (ref >60)
Glucose, Bld: 245 mg/dL — ABNORMAL HIGH (ref 70–99)
POTASSIUM: 4.3 mmol/L (ref 3.5–5.1)
Sodium: 135 mmol/L (ref 135–145)
Total Bilirubin: 0.6 mg/dL (ref 0.3–1.2)
Total Protein: 7.2 g/dL (ref 6.5–8.1)

## 2018-04-24 LAB — CBC WITH DIFFERENTIAL/PLATELET
Abs Immature Granulocytes: 0.15 10*3/uL — ABNORMAL HIGH (ref 0.00–0.07)
BASOS ABS: 0 10*3/uL (ref 0.0–0.1)
Basophils Relative: 0 %
Eosinophils Absolute: 0.3 10*3/uL (ref 0.0–0.5)
Eosinophils Relative: 3 %
HCT: 32.2 % — ABNORMAL LOW (ref 36.0–46.0)
Hemoglobin: 9.9 g/dL — ABNORMAL LOW (ref 12.0–15.0)
Immature Granulocytes: 2 %
Lymphocytes Relative: 21 %
Lymphs Abs: 1.9 10*3/uL (ref 0.7–4.0)
MCH: 30.1 pg (ref 26.0–34.0)
MCHC: 30.7 g/dL (ref 30.0–36.0)
MCV: 97.9 fL (ref 80.0–100.0)
Monocytes Absolute: 0.9 10*3/uL (ref 0.1–1.0)
Monocytes Relative: 10 %
NRBC: 0 % (ref 0.0–0.2)
Neutro Abs: 5.9 10*3/uL (ref 1.7–7.7)
Neutrophils Relative %: 64 %
Platelets: 238 10*3/uL (ref 150–400)
RBC: 3.29 MIL/uL — ABNORMAL LOW (ref 3.87–5.11)
RDW: 17.2 % — AB (ref 11.5–15.5)
WBC: 9.1 10*3/uL (ref 4.0–10.5)

## 2018-04-24 LAB — PROTIME-INR
INR: 1.77
Prothrombin Time: 20.4 seconds — ABNORMAL HIGH (ref 11.4–15.2)

## 2018-04-24 LAB — URINALYSIS, ROUTINE W REFLEX MICROSCOPIC
BILIRUBIN URINE: NEGATIVE
Glucose, UA: NEGATIVE mg/dL
Hgb urine dipstick: NEGATIVE
Ketones, ur: NEGATIVE mg/dL
Nitrite: NEGATIVE
Protein, ur: NEGATIVE mg/dL
Specific Gravity, Urine: <1.005 — ABNORMAL LOW (ref 1.005–1.030)
pH: 6 (ref 5.0–8.0)

## 2018-04-24 LAB — URINALYSIS, MICROSCOPIC (REFLEX): RBC / HPF: NONE SEEN RBC/hpf (ref 0–5)

## 2018-04-24 LAB — TSH: TSH: 5.7 u[IU]/mL — ABNORMAL HIGH (ref 0.350–4.500)

## 2018-04-24 NOTE — ED Notes (Signed)
ED Provider at bedside. 

## 2018-04-24 NOTE — ED Notes (Signed)
Patient transported to CT 

## 2018-04-24 NOTE — ED Triage Notes (Signed)
She slipped and fell 5 days ago. She is sore on her left ribs. She has been weak and tired x 2 weeks per daughter.
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
## 2018-04-24 NOTE — Discharge Instructions (Addendum)
Your urine has been sent to grow out.  If it is positive, you will receive a phone call in some antibiotics will be called in as needed. Follow-up with your primary care doctor for further evaluation of your symptoms. Return to the emergency room with any new, worsening, concerning symptoms.

## 2018-04-26 LAB — URINE CULTURE: Culture: <10000 — AB

## 2018-04-30 DIAGNOSIS — Z87440 Personal history of urinary (tract) infections: Secondary | ICD-10-CM | POA: Diagnosis not present

## 2018-05-07 ENCOUNTER — Ambulatory Visit (INDEPENDENT_AMBULATORY_CARE_PROVIDER_SITE_OTHER): Payer: Medicare Other | Admitting: Internal Medicine

## 2018-05-07 DIAGNOSIS — Z7901 Long term (current) use of anticoagulants: Secondary | ICD-10-CM | POA: Diagnosis not present

## 2018-05-07 DIAGNOSIS — Z5181 Encounter for therapeutic drug level monitoring: Secondary | ICD-10-CM | POA: Diagnosis not present

## 2018-05-07 DIAGNOSIS — I4891 Unspecified atrial fibrillation: Secondary | ICD-10-CM | POA: Diagnosis not present

## 2018-05-07 LAB — POCT INR: INR: 2.8 (ref 2.0–3.0)

## 2018-05-18 ENCOUNTER — Encounter (HOSPITAL_COMMUNITY): Payer: Medicare Other

## 2018-05-21 ENCOUNTER — Ambulatory Visit (INDEPENDENT_AMBULATORY_CARE_PROVIDER_SITE_OTHER): Payer: Medicare Other

## 2018-05-21 ENCOUNTER — Ambulatory Visit (HOSPITAL_COMMUNITY)
Admission: RE | Admit: 2018-05-21 | Discharge: 2018-05-21 | Disposition: A | Discharge status: Home / Self Care | Payer: Medicare Other | Source: Ambulatory Visit | Attending: Nephrology | Admitting: Nephrology

## 2018-05-21 VITALS — BP 128/42 | HR 56 | Temp 97.9°F | Resp 20

## 2018-05-21 DIAGNOSIS — I4891 Unspecified atrial fibrillation: Secondary | ICD-10-CM

## 2018-05-21 DIAGNOSIS — Z5181 Encounter for therapeutic drug level monitoring: Secondary | ICD-10-CM | POA: Diagnosis not present

## 2018-05-21 DIAGNOSIS — N184 Chronic kidney disease, stage 4 (severe): Secondary | ICD-10-CM | POA: Diagnosis not present

## 2018-05-21 LAB — IRON AND TIBC
Iron: 27 ug/dL — ABNORMAL LOW (ref 28–170)
Saturation Ratios: 14 % (ref 10.4–31.8)
TIBC: 193 ug/dL — ABNORMAL LOW (ref 250–450)
UIBC: 166 ug/dL

## 2018-05-21 LAB — POCT INR: INR: 3.3 — AB (ref 2.0–3.0)

## 2018-05-21 LAB — FERRITIN: Ferritin: 491 ng/mL — ABNORMAL HIGH (ref 11–307)

## 2018-05-21 LAB — POCT HEMOGLOBIN-HEMACUE: Hemoglobin: 9.1 g/dL — ABNORMAL LOW (ref 12.0–15.0)

## 2018-05-21 MED ORDER — EPOETIN ALFA 10000 UNIT/ML IJ SOLN
INTRAMUSCULAR | Status: AC
  Administered 2018-05-21: 10000 [IU] via SUBCUTANEOUS
  Filled 2018-05-21: qty 1

## 2018-05-21 MED ORDER — EPOETIN ALFA 10000 UNIT/ML IJ SOLN
10000.0000 [IU] | INTRAMUSCULAR | Status: DC
  Administered 2018-05-21: 10000 [IU] via SUBCUTANEOUS

## 2018-05-21 NOTE — Patient Instructions (Signed)
Description   Spoke with [name]-pt's dtr & instructed to have pt skip today's dosage of Coumadin, then resume same dosage 2 tablets every day. Recheck INR in 2 weeks.  Call if placed on any new medications [phone].

## 2018-05-23 ENCOUNTER — Telehealth: Payer: Self-pay | Admitting: Cardiology

## 2018-05-23 NOTE — Telephone Encounter (Signed)
New Message   Pts daughter is calling wondering if she can give the pt CBD oil to help with her depression, weightloss, fatigue.  Please call

## 2018-05-23 NOTE — Telephone Encounter (Signed)
Daughter is calling with a non-urgent question for Dr Marlou Porch.  Daughter states that she is thinking of using CBD oil on the pt, for depression, weight loss, and fatigue.  Daughter wants to make sure this is not contraindicated with her cardiac history or cardiac meds.  Informed the daughter that I will route this question to Dr Marlou Porch and our Pharmacist, for further review and recommendation.  Informed the pts daughter that a triage nurse will follow-up with her shortly thereafter.  Pts daughter verbalized understanding and agrees with this plan.

## 2018-05-23 NOTE — Telephone Encounter (Signed)
Would not recommend CBD use for Madeline [name] because there is no data that it will help with depression, weight loss, or fatigue. If anything, it might make weight loss harder and fatigue worse. Also has the potential to increase concentrations of her carvedilol, and her HR is already in the mid 50s.

## 2018-05-23 NOTE — Telephone Encounter (Signed)
Spoke back with the pts daughter and informed her of [employer] [place] PharmD recommendations about using CBD oil.  Educated the pts daughter as to why this is contraindicated.  Daughter verbalized understanding and agrees with this plan.  Daughter gracious for all the assistance provided.

## 2018-05-25 ENCOUNTER — Telehealth: Payer: Self-pay | Admitting: Cardiology

## 2018-05-25 DIAGNOSIS — I5032 Chronic diastolic (congestive) heart failure: Secondary | ICD-10-CM

## 2018-05-25 DIAGNOSIS — N184 Chronic kidney disease, stage 4 (severe): Secondary | ICD-10-CM

## 2018-05-25 DIAGNOSIS — I4819 Other persistent atrial fibrillation: Secondary | ICD-10-CM

## 2018-05-25 NOTE — Telephone Encounter (Signed)
Spoke with the patient, she stated she wanted to know if there was anything our office could do to help her mother. She requested pain management to help relax her because she "knows her mother is dying". She brought up the idea about hospice and wanted to know Dr. Kingsley Plan opinion. The patient has SOB yesterday during relation but today feels better. Advised the patient if her SOB occurs during rest again she should go to the hospital.

## 2018-05-25 NOTE — Telephone Encounter (Signed)
Patient's daughter called, she thinks her mother is in CHF. Her mother has lost 13 lbs since Christmas, she really doesn't eat much, she says she's not hungry. Her sugar has been on the low side in the mornings, in the 90's for her that is low she is diabetic.  She is always tired, and weak. She was very SOB yesterday.

## 2018-05-28 DIAGNOSIS — E890 Postprocedural hypothyroidism: Secondary | ICD-10-CM | POA: Diagnosis not present

## 2018-05-28 DIAGNOSIS — Z7901 Long term (current) use of anticoagulants: Secondary | ICD-10-CM | POA: Diagnosis not present

## 2018-05-28 DIAGNOSIS — I5032 Chronic diastolic (congestive) heart failure: Secondary | ICD-10-CM | POA: Diagnosis not present

## 2018-05-28 DIAGNOSIS — E1122 Type 2 diabetes mellitus with diabetic chronic kidney disease: Secondary | ICD-10-CM | POA: Diagnosis not present

## 2018-05-28 DIAGNOSIS — R634 Abnormal weight loss: Secondary | ICD-10-CM | POA: Diagnosis not present

## 2018-05-28 DIAGNOSIS — M170 Bilateral primary osteoarthritis of knee: Secondary | ICD-10-CM | POA: Diagnosis not present

## 2018-05-28 DIAGNOSIS — R54 Age-related physical debility: Secondary | ICD-10-CM | POA: Diagnosis not present

## 2018-05-28 DIAGNOSIS — R531 Weakness: Secondary | ICD-10-CM | POA: Diagnosis not present

## 2018-05-28 DIAGNOSIS — Z7984 Long term (current) use of oral hypoglycemic drugs: Secondary | ICD-10-CM | POA: Diagnosis not present

## 2018-05-29 NOTE — Telephone Encounter (Signed)
Let's get her a palliative care consult to go to her house to discuss those options with her. Thanks Candee Furbish, MD

## 2018-05-30 ENCOUNTER — Telehealth: Payer: Self-pay | Admitting: Cardiology

## 2018-05-30 NOTE — Telephone Encounter (Signed)
New message       Pt daughter stated that she took pt to her pcp on Monday and was told that her congestive heart failure has gotten worse and wants to know what Dr. Marlou Porch  can do and if he ordered injections for her on 2/[facility] at medical day care. Please follow up

## 2018-05-30 NOTE — Telephone Encounter (Signed)
Called back to speak with daughter RE: hospice/pallitive care referral.  Daughter reports pt was seen at PCP office on Monday and had lab work.  She is requesting Dr Marlou Porch review those results.  She is asking if he ordered her procrit injection.  Advised Dr Marlou Porch did not order these.  Advised I will have Dr Marlou Porch review lab results as requested and call back after he does so.  Daughter states understanding.

## 2018-05-30 NOTE — Telephone Encounter (Signed)
Please see 1/17 telephone note for documentation r/t to this call.

## 2018-05-30 NOTE — Telephone Encounter (Signed)
Spoke with daughter to make her aware Dr Marlou Porch did review her lba results.  She reports having a dry sounding cough with clear sputum that usually occurs when she has fluid overload.  Pt's weight is down (15 lbs), on edema at feet/ankles, possible at abdomin but very little if there, c/o feeling weak/tired.  She is taking Furosemide 120 mg BID and Metolazone 2.5 on Mondays and Fridays.  Daughter aware I will notify Dr Marlou Porch and c/b with any new orders.   daughter will discuss palliative/hospice care with patient before deciding if they want a consult with them.

## 2018-05-31 NOTE — Telephone Encounter (Signed)
No changes at this time.  Thanks Candee Furbish, MD

## 2018-06-01 ENCOUNTER — Telehealth: Payer: Self-pay | Admitting: Cardiology

## 2018-06-01 NOTE — Telephone Encounter (Signed)
Daughter [name] aware per Dr Marlou Porch - no changes in care at this time.  Daughter reports pt is having a better day today.  Wt is stable at 187 lbs.  They have decided they would like to discuss palliative care and aware an order will be placed for them to come to the home to discuss.  Madeline [name] was grateful for the call and f/u.

## 2018-06-01 NOTE — Telephone Encounter (Signed)
Called phone number left for Madeline [name].  Phone number is for [facility].  Dr Marlou Porch reviewed pt's lab work and is aware of the BNP results.  At time time he does not give any orders r/t treating this number.  The pt's weight is down, no more SOB than her normal, she does have a dry cough per daughter.  Pt is sleeping a lot and has fatigue.  Her H 9.1, Hct 27.1, BUN 47 and creat 2.47.  She is to continue medications as listed.

## 2018-06-01 NOTE — Telephone Encounter (Signed)
New message   Per Judson Roch need to know if there is concern for bmp level being at 732. Please advise.

## 2018-06-04 ENCOUNTER — Encounter (HOSPITAL_COMMUNITY): Payer: Medicare Other

## 2018-06-04 ENCOUNTER — Ambulatory Visit (INDEPENDENT_AMBULATORY_CARE_PROVIDER_SITE_OTHER): Payer: Medicare Other | Admitting: Pharmacist

## 2018-06-04 DIAGNOSIS — I4891 Unspecified atrial fibrillation: Secondary | ICD-10-CM

## 2018-06-04 DIAGNOSIS — Z5181 Encounter for therapeutic drug level monitoring: Secondary | ICD-10-CM | POA: Diagnosis not present

## 2018-06-04 LAB — POCT INR: INR: 3.1 — AB (ref 2.0–3.0)

## 2018-06-11 ENCOUNTER — Other Ambulatory Visit: Payer: Self-pay | Admitting: Cardiology

## 2018-06-18 ENCOUNTER — Ambulatory Visit (INDEPENDENT_AMBULATORY_CARE_PROVIDER_SITE_OTHER): Payer: Medicare Other | Admitting: Internal Medicine

## 2018-06-18 ENCOUNTER — Ambulatory Visit (HOSPITAL_COMMUNITY)
Admission: RE | Admit: 2018-06-18 | Discharge: 2018-06-18 | Disposition: A | Discharge status: Home / Self Care | Payer: Medicare Other | Source: Ambulatory Visit | Attending: Nephrology | Admitting: Nephrology

## 2018-06-18 VITALS — BP 147/43 | HR 55 | Temp 98.4°F | Resp 20

## 2018-06-18 DIAGNOSIS — I4891 Unspecified atrial fibrillation: Secondary | ICD-10-CM | POA: Diagnosis not present

## 2018-06-18 DIAGNOSIS — N184 Chronic kidney disease, stage 4 (severe): Secondary | ICD-10-CM | POA: Diagnosis not present

## 2018-06-18 DIAGNOSIS — Z5181 Encounter for therapeutic drug level monitoring: Secondary | ICD-10-CM

## 2018-06-18 LAB — IRON AND TIBC
IRON: 30 ug/dL (ref 28–170)
Saturation Ratios: 16 % (ref 10.4–31.8)
TIBC: 183 ug/dL — ABNORMAL LOW (ref 250–450)
UIBC: 153 ug/dL

## 2018-06-18 LAB — FERRITIN: Ferritin: 438 ng/mL — ABNORMAL HIGH (ref 11–307)

## 2018-06-18 LAB — POCT INR: INR: 3.6 — AB (ref 2.0–3.0)

## 2018-06-18 MED ORDER — EPOETIN ALFA 10000 UNIT/ML IJ SOLN
10000.0000 [IU] | INTRAMUSCULAR | Status: DC
  Administered 2018-06-18: 10000 [IU] via SUBCUTANEOUS

## 2018-06-18 MED ORDER — EPOETIN ALFA 10000 UNIT/ML IJ SOLN
INTRAMUSCULAR | Status: AC
  Filled 2018-06-18: qty 1

## 2018-06-18 NOTE — Patient Instructions (Signed)
Description   Spoke with Madeline [name]-pt's dtr & instructed to have pt skip today's dosage of Coumadin, then decrease dosage to 2 tablets daily, except 1 tablet on Mondays and Fridays. Recheck INR in 2 weeks.  Call if placed on any new medications [phone].

## 2018-06-19 LAB — POCT HEMOGLOBIN-HEMACUE: Hemoglobin: 8.9 g/dL — ABNORMAL LOW (ref 12.0–15.0)

## 2018-07-02 ENCOUNTER — Ambulatory Visit (INDEPENDENT_AMBULATORY_CARE_PROVIDER_SITE_OTHER): Payer: Medicare Other

## 2018-07-02 DIAGNOSIS — Z5181 Encounter for therapeutic drug level monitoring: Secondary | ICD-10-CM

## 2018-07-02 DIAGNOSIS — I4891 Unspecified atrial fibrillation: Secondary | ICD-10-CM

## 2018-07-02 DIAGNOSIS — Z7901 Long term (current) use of anticoagulants: Secondary | ICD-10-CM | POA: Diagnosis not present

## 2018-07-02 LAB — POCT INR: INR: 3.2 — AB (ref 2.0–3.0)

## 2018-07-02 NOTE — Patient Instructions (Signed)
Description   Spoke with [name]-pt's dtr & instructed to have pt start taking 2 tablets daily, except 1 tablet on Mondays, Wednesdays and Fridays. Recheck INR in 2 weeks.  Call if placed on any new medications [phone].

## 2018-07-16 ENCOUNTER — Ambulatory Visit (INDEPENDENT_AMBULATORY_CARE_PROVIDER_SITE_OTHER): Payer: Medicare Other | Admitting: Internal Medicine

## 2018-07-16 ENCOUNTER — Other Ambulatory Visit: Payer: Self-pay

## 2018-07-16 ENCOUNTER — Ambulatory Visit (HOSPITAL_COMMUNITY)
Admission: RE | Admit: 2018-07-16 | Discharge: 2018-07-16 | Disposition: A | Discharge status: Home / Self Care | Payer: Medicare Other | Source: Ambulatory Visit | Attending: Nephrology | Admitting: Nephrology

## 2018-07-16 VITALS — BP 140/41 | HR 56 | Temp 98.1°F | Resp 18

## 2018-07-16 DIAGNOSIS — I4891 Unspecified atrial fibrillation: Secondary | ICD-10-CM | POA: Diagnosis not present

## 2018-07-16 DIAGNOSIS — Z5181 Encounter for therapeutic drug level monitoring: Secondary | ICD-10-CM

## 2018-07-16 DIAGNOSIS — N184 Chronic kidney disease, stage 4 (severe): Secondary | ICD-10-CM | POA: Insufficient documentation

## 2018-07-16 LAB — POCT INR: INR: 2.5 (ref 2.0–3.0)

## 2018-07-16 LAB — IRON AND TIBC
IRON: 31 ug/dL (ref 28–170)
Saturation Ratios: 18 % (ref 10.4–31.8)
TIBC: 175 ug/dL — ABNORMAL LOW (ref 250–450)
UIBC: 144 ug/dL

## 2018-07-16 LAB — POCT HEMOGLOBIN-HEMACUE: Hemoglobin: 9 g/dL — ABNORMAL LOW (ref 12.0–15.0)

## 2018-07-16 LAB — FERRITIN: Ferritin: 255 ng/mL (ref 11–307)

## 2018-07-16 MED ORDER — EPOETIN ALFA 10000 UNIT/ML IJ SOLN
INTRAMUSCULAR | Status: AC
  Administered 2018-07-16: 10000 [IU] via SUBCUTANEOUS
  Filled 2018-07-16: qty 1

## 2018-07-16 MED ORDER — EPOETIN ALFA 10000 UNIT/ML IJ SOLN
10000.0000 [IU] | INTRAMUSCULAR | Status: DC
  Administered 2018-07-16: 10000 [IU] via SUBCUTANEOUS

## 2018-07-16 NOTE — Patient Instructions (Signed)
Description   Spoke with Madeline [name]-pt's dtr & instructed to have pt to continue taking 2 tablets daily, except 1 tablet on Mondays, Wednesdays and Fridays. Recheck INR in 2 weeks.  Call if placed on any new medications [phone].

## 2018-07-30 ENCOUNTER — Ambulatory Visit (INDEPENDENT_AMBULATORY_CARE_PROVIDER_SITE_OTHER): Payer: Medicare Other | Admitting: Pharmacist

## 2018-07-30 DIAGNOSIS — Z5181 Encounter for therapeutic drug level monitoring: Secondary | ICD-10-CM | POA: Diagnosis not present

## 2018-07-30 DIAGNOSIS — I4891 Unspecified atrial fibrillation: Secondary | ICD-10-CM | POA: Diagnosis not present

## 2018-07-30 LAB — POCT INR: INR: 2.5 (ref 2.0–3.0)

## 2018-08-10 ENCOUNTER — Other Ambulatory Visit (HOSPITAL_COMMUNITY): Payer: Self-pay | Admitting: *Deleted

## 2018-08-13 ENCOUNTER — Ambulatory Visit (HOSPITAL_COMMUNITY)
Admission: RE | Admit: 2018-08-13 | Discharge: 2018-08-13 | Disposition: A | Discharge status: Home / Self Care | Payer: Medicare Other | Source: Ambulatory Visit | Attending: Nephrology | Admitting: Nephrology

## 2018-08-13 ENCOUNTER — Other Ambulatory Visit: Payer: Self-pay

## 2018-08-13 VITALS — BP 140/41 | HR 52 | Temp 98.6°F | Resp 20

## 2018-08-13 DIAGNOSIS — N184 Chronic kidney disease, stage 4 (severe): Secondary | ICD-10-CM | POA: Insufficient documentation

## 2018-08-13 LAB — POCT HEMOGLOBIN-HEMACUE: Hemoglobin: 9.5 g/dL — ABNORMAL LOW (ref 12.0–15.0)

## 2018-08-13 MED ORDER — EPOETIN ALFA 10000 UNIT/ML IJ SOLN
20000.0000 [IU] | INTRAMUSCULAR | Status: DC
  Administered 2018-08-13: 20000 [IU] via SUBCUTANEOUS

## 2018-08-13 MED ORDER — SODIUM CHLORIDE 0.9 % IV SOLN
510.0000 mg | Freq: Once | INTRAVENOUS | Status: AC
  Administered 2018-08-13: 510 mg via INTRAVENOUS
  Filled 2018-08-13: qty 510

## 2018-08-13 MED ORDER — EPOETIN ALFA 20000 UNIT/ML IJ SOLN
INTRAMUSCULAR | Status: AC
  Filled 2018-08-13: qty 1

## 2018-08-14 ENCOUNTER — Telehealth: Payer: Self-pay | Admitting: *Deleted

## 2018-08-14 DIAGNOSIS — M170 Bilateral primary osteoarthritis of knee: Secondary | ICD-10-CM | POA: Diagnosis not present

## 2018-08-14 DIAGNOSIS — Z7901 Long term (current) use of anticoagulants: Secondary | ICD-10-CM | POA: Diagnosis not present

## 2018-08-14 DIAGNOSIS — I482 Chronic atrial fibrillation, unspecified: Secondary | ICD-10-CM | POA: Diagnosis not present

## 2018-08-14 DIAGNOSIS — I130 Hypertensive heart and chronic kidney disease with heart failure and stage 1 through stage 4 chronic kidney disease, or unspecified chronic kidney disease: Secondary | ICD-10-CM | POA: Diagnosis not present

## 2018-08-14 DIAGNOSIS — R54 Age-related physical debility: Secondary | ICD-10-CM | POA: Diagnosis not present

## 2018-08-14 DIAGNOSIS — E890 Postprocedural hypothyroidism: Secondary | ICD-10-CM | POA: Diagnosis not present

## 2018-08-14 DIAGNOSIS — N184 Chronic kidney disease, stage 4 (severe): Secondary | ICD-10-CM | POA: Diagnosis not present

## 2018-08-14 DIAGNOSIS — I5032 Chronic diastolic (congestive) heart failure: Secondary | ICD-10-CM | POA: Diagnosis not present

## 2018-08-14 DIAGNOSIS — E1122 Type 2 diabetes mellitus with diabetic chronic kidney disease: Secondary | ICD-10-CM | POA: Diagnosis not present

## 2018-08-14 MED FILL — Epoetin Alfa Inj 20000 Unit/ML: INTRAMUSCULAR | Qty: 1 | Status: AC

## 2018-08-14 NOTE — Telephone Encounter (Signed)
   Primary Cardiologist:  Dr Candee Furbish   Patient contacted.  History reviewed.  No symptoms to suggest any unstable cardiac conditions.  Based on discussion, with current pandemic situation, we will be postponing this appointment for Madeline [name] with a plan for f/u in  July or sooner if feasible/necessary.  If symptoms change, she has been instructed to contact our office.   Routing to C19 CANCEL pool for tracking (P CV DIV CV19 CANCEL - reason for visit "other.") and assigning priority (1 = 4-6 wks, 2 = 6-12 wks, 3 = >12 wks).   Ellwood Dense, RN  08/14/2018 3:27 PM   Per daughter - pt is doing very well and they are comfortable rescheduling until a later date.  They know to c/b if any issues or questions arise.

## 2018-08-15 LAB — POCT INR: INR: 1.7 — AB (ref 2.0–3.0)

## 2018-08-16 ENCOUNTER — Ambulatory Visit (INDEPENDENT_AMBULATORY_CARE_PROVIDER_SITE_OTHER): Payer: Medicare Other | Admitting: Cardiology

## 2018-08-16 DIAGNOSIS — I4891 Unspecified atrial fibrillation: Secondary | ICD-10-CM | POA: Diagnosis not present

## 2018-08-16 DIAGNOSIS — Z5181 Encounter for therapeutic drug level monitoring: Secondary | ICD-10-CM

## 2018-08-16 NOTE — Patient Instructions (Signed)
Description   Spoke with [name]-pt's dtr & instructed to have pt to take 2.5 tablets today then continue taking 2 tablets daily, except 1 tablet on Mondays, Wednesdays and Fridays. Recheck INR in 2 weeks.  Call if placed on any new medications [phone].

## 2018-08-23 ENCOUNTER — Ambulatory Visit: Payer: Medicare Other | Admitting: Cardiology

## 2018-08-30 ENCOUNTER — Ambulatory Visit (INDEPENDENT_AMBULATORY_CARE_PROVIDER_SITE_OTHER): Payer: Medicare Other | Admitting: Cardiology

## 2018-08-30 DIAGNOSIS — I4891 Unspecified atrial fibrillation: Secondary | ICD-10-CM

## 2018-08-30 DIAGNOSIS — Z5181 Encounter for therapeutic drug level monitoring: Secondary | ICD-10-CM

## 2018-08-30 DIAGNOSIS — Z7901 Long term (current) use of anticoagulants: Secondary | ICD-10-CM | POA: Diagnosis not present

## 2018-08-30 LAB — POCT INR: INR: 2.7 (ref 2.0–3.0)

## 2018-09-10 ENCOUNTER — Other Ambulatory Visit: Payer: Self-pay

## 2018-09-10 ENCOUNTER — Ambulatory Visit (HOSPITAL_COMMUNITY)
Admission: RE | Admit: 2018-09-10 | Discharge: 2018-09-10 | Disposition: A | Discharge status: Home / Self Care | Payer: Medicare Other | Source: Ambulatory Visit | Attending: Nephrology | Admitting: Nephrology

## 2018-09-10 VITALS — BP 138/33 | HR 48 | Temp 97.6°F | Resp 20

## 2018-09-10 DIAGNOSIS — N184 Chronic kidney disease, stage 4 (severe): Secondary | ICD-10-CM

## 2018-09-10 LAB — IRON AND TIBC
Iron: 30 ug/dL (ref 28–170)
Saturation Ratios: 17 % (ref 10.4–31.8)
TIBC: 175 ug/dL — ABNORMAL LOW (ref 250–450)
UIBC: 145 ug/dL

## 2018-09-10 LAB — FERRITIN: Ferritin: 492 ng/mL — ABNORMAL HIGH (ref 11–307)

## 2018-09-10 LAB — POCT HEMOGLOBIN-HEMACUE: Hemoglobin: 9.4 g/dL — ABNORMAL LOW (ref 12.0–15.0)

## 2018-09-10 MED ORDER — EPOETIN ALFA 10000 UNIT/ML IJ SOLN: 20000.0000 [IU] | INTRAMUSCULAR | Status: DC

## 2018-09-10 MED ORDER — EPOETIN ALFA 20000 UNIT/ML IJ SOLN
INTRAMUSCULAR | Status: AC
  Administered 2018-09-10: 20000 [IU]
  Filled 2018-09-10: qty 1

## 2018-09-13 ENCOUNTER — Ambulatory Visit (INDEPENDENT_AMBULATORY_CARE_PROVIDER_SITE_OTHER): Payer: Medicare Other | Admitting: *Deleted

## 2018-09-13 DIAGNOSIS — Z5181 Encounter for therapeutic drug level monitoring: Secondary | ICD-10-CM | POA: Diagnosis not present

## 2018-09-13 DIAGNOSIS — I4891 Unspecified atrial fibrillation: Secondary | ICD-10-CM

## 2018-09-13 LAB — POCT INR: INR: 2.2 (ref 2.0–3.0)

## 2018-09-13 NOTE — Patient Instructions (Signed)
Spoke with [name]-pt's dtr & instructed to have pt continue taking 2 tablets daily except 1 tablet on Mondays, Wednesdays and Fridays. Recheck INR in 2 weeks.  Call if placed on any new medications [phone].

## 2018-09-28 ENCOUNTER — Ambulatory Visit (INDEPENDENT_AMBULATORY_CARE_PROVIDER_SITE_OTHER): Payer: Medicare Other | Admitting: Internal Medicine

## 2018-09-28 DIAGNOSIS — Z5181 Encounter for therapeutic drug level monitoring: Secondary | ICD-10-CM

## 2018-09-28 DIAGNOSIS — I4891 Unspecified atrial fibrillation: Secondary | ICD-10-CM | POA: Diagnosis not present

## 2018-09-28 LAB — POCT INR: INR: 2.4 (ref 2.0–3.0)

## 2018-10-08 ENCOUNTER — Other Ambulatory Visit: Payer: Self-pay

## 2018-10-08 ENCOUNTER — Ambulatory Visit (HOSPITAL_COMMUNITY)
Admission: RE | Admit: 2018-10-08 | Discharge: 2018-10-08 | Disposition: A | Discharge status: Home / Self Care | Payer: Medicare Other | Source: Ambulatory Visit | Attending: Nephrology | Admitting: Nephrology

## 2018-10-08 DIAGNOSIS — N184 Chronic kidney disease, stage 4 (severe): Secondary | ICD-10-CM | POA: Insufficient documentation

## 2018-10-08 LAB — FERRITIN: Ferritin: 569 ng/mL — ABNORMAL HIGH (ref 11–307)

## 2018-10-08 LAB — POCT HEMOGLOBIN-HEMACUE: Hemoglobin: 9.4 g/dL — ABNORMAL LOW (ref 12.0–15.0)

## 2018-10-08 LAB — IRON AND TIBC
Iron: 31 ug/dL (ref 28–170)
Saturation Ratios: 17 % (ref 10.4–31.8)
TIBC: 188 ug/dL — ABNORMAL LOW (ref 250–450)
UIBC: 157 ug/dL

## 2018-10-08 MED ORDER — EPOETIN ALFA 10000 UNIT/ML IJ SOLN: 20000.0000 [IU] | INTRAMUSCULAR | Status: DC

## 2018-10-08 MED ORDER — EPOETIN ALFA 20000 UNIT/ML IJ SOLN
INTRAMUSCULAR | Status: AC
  Administered 2018-10-08: 20000 [IU] via SUBCUTANEOUS
  Filled 2018-10-08: qty 1

## 2018-10-12 ENCOUNTER — Ambulatory Visit (INDEPENDENT_AMBULATORY_CARE_PROVIDER_SITE_OTHER): Payer: Medicare Other | Admitting: Cardiology

## 2018-10-12 DIAGNOSIS — I4891 Unspecified atrial fibrillation: Secondary | ICD-10-CM | POA: Diagnosis not present

## 2018-10-12 DIAGNOSIS — M1711 Unilateral primary osteoarthritis, right knee: Secondary | ICD-10-CM | POA: Diagnosis not present

## 2018-10-12 DIAGNOSIS — M25562 Pain in left knee: Secondary | ICD-10-CM | POA: Diagnosis not present

## 2018-10-12 DIAGNOSIS — Z5181 Encounter for therapeutic drug level monitoring: Secondary | ICD-10-CM

## 2018-10-12 DIAGNOSIS — M170 Bilateral primary osteoarthritis of knee: Secondary | ICD-10-CM | POA: Diagnosis not present

## 2018-10-12 DIAGNOSIS — M25561 Pain in right knee: Secondary | ICD-10-CM | POA: Diagnosis not present

## 2018-10-12 DIAGNOSIS — M1712 Unilateral primary osteoarthritis, left knee: Secondary | ICD-10-CM | POA: Diagnosis not present

## 2018-10-12 LAB — POCT INR: INR: 2.4 (ref 2.0–3.0)

## 2018-10-19 DIAGNOSIS — M25562 Pain in left knee: Secondary | ICD-10-CM | POA: Diagnosis not present

## 2018-10-19 DIAGNOSIS — M25561 Pain in right knee: Secondary | ICD-10-CM | POA: Diagnosis not present

## 2018-10-19 DIAGNOSIS — M170 Bilateral primary osteoarthritis of knee: Secondary | ICD-10-CM | POA: Diagnosis not present

## 2018-10-26 ENCOUNTER — Ambulatory Visit (INDEPENDENT_AMBULATORY_CARE_PROVIDER_SITE_OTHER): Payer: Medicare Other

## 2018-10-26 DIAGNOSIS — M170 Bilateral primary osteoarthritis of knee: Secondary | ICD-10-CM | POA: Diagnosis not present

## 2018-10-26 DIAGNOSIS — I4891 Unspecified atrial fibrillation: Secondary | ICD-10-CM | POA: Diagnosis not present

## 2018-10-26 DIAGNOSIS — Z5181 Encounter for therapeutic drug level monitoring: Secondary | ICD-10-CM | POA: Diagnosis not present

## 2018-10-26 DIAGNOSIS — M25561 Pain in right knee: Secondary | ICD-10-CM | POA: Diagnosis not present

## 2018-10-26 DIAGNOSIS — M25562 Pain in left knee: Secondary | ICD-10-CM | POA: Diagnosis not present

## 2018-10-26 DIAGNOSIS — Z7901 Long term (current) use of anticoagulants: Secondary | ICD-10-CM | POA: Diagnosis not present

## 2018-10-26 LAB — POCT INR: INR: 2.1 (ref 2.0–3.0)

## 2018-10-26 NOTE — Patient Instructions (Signed)
Description   Spoke with Madeline [name] & instructed to have pt continue taking 2 tablets daily except 1 tablet on Mondays, Wednesdays and Fridays. Recheck INR in 2 weeks on Thursday given the 4th holiday.  Call if placed on any new medications [phone].

## 2018-11-02 ENCOUNTER — Other Ambulatory Visit (HOSPITAL_COMMUNITY): Payer: Self-pay | Admitting: *Deleted

## 2018-11-05 ENCOUNTER — Encounter (HOSPITAL_COMMUNITY)
Admission: RE | Admit: 2018-11-05 | Discharge: 2018-11-05 | Disposition: A | Discharge status: Home / Self Care | Payer: Medicare Other | Source: Ambulatory Visit | Attending: Nephrology | Admitting: Nephrology

## 2018-11-05 ENCOUNTER — Other Ambulatory Visit: Payer: Self-pay

## 2018-11-05 VITALS — BP 139/46 | HR 50 | Temp 97.6°F | Resp 20 | Ht 62.0 in | Wt 179.0 lb

## 2018-11-05 DIAGNOSIS — N184 Chronic kidney disease, stage 4 (severe): Secondary | ICD-10-CM | POA: Insufficient documentation

## 2018-11-05 LAB — POCT HEMOGLOBIN-HEMACUE: Hemoglobin: 9.7 g/dL — ABNORMAL LOW (ref 12.0–15.0)

## 2018-11-05 MED ORDER — EPOETIN ALFA 10000 UNIT/ML IJ SOLN: 20000.0000 [IU] | INTRAMUSCULAR | Status: DC

## 2018-11-05 MED ORDER — EPOETIN ALFA 20000 UNIT/ML IJ SOLN
INTRAMUSCULAR | Status: AC
  Administered 2018-11-05: 20000 [IU] via SUBCUTANEOUS
  Filled 2018-11-05: qty 1

## 2018-11-05 MED ORDER — SODIUM CHLORIDE 0.9 % IV SOLN
510.0000 mg | INTRAVENOUS | Status: DC
  Administered 2018-11-05: 510 mg via INTRAVENOUS
  Filled 2018-11-05: qty 17

## 2018-11-08 ENCOUNTER — Ambulatory Visit (INDEPENDENT_AMBULATORY_CARE_PROVIDER_SITE_OTHER): Payer: Medicare Other | Admitting: Cardiovascular Disease

## 2018-11-08 DIAGNOSIS — I4891 Unspecified atrial fibrillation: Secondary | ICD-10-CM

## 2018-11-08 DIAGNOSIS — Z5181 Encounter for therapeutic drug level monitoring: Secondary | ICD-10-CM | POA: Diagnosis not present

## 2018-11-08 LAB — POCT INR: INR: 1.8 — AB (ref 2.0–3.0)

## 2018-11-08 NOTE — Patient Instructions (Signed)
Description   Spoke with [name]-pt's dtr & instructed to have pt take 2.5 tablets today, then continue taking 2 tablets daily except 1 tablet on Mondays, Wednesdays and Fridays. Recheck INR in 2 weeks.  Call if placed on any new medications [phone].

## 2018-11-12 ENCOUNTER — Other Ambulatory Visit: Payer: Self-pay

## 2018-11-12 ENCOUNTER — Ambulatory Visit (HOSPITAL_COMMUNITY)
Admission: RE | Admit: 2018-11-12 | Discharge: 2018-11-12 | Disposition: A | Discharge status: Home / Self Care | Payer: Medicare Other | Source: Ambulatory Visit | Attending: Nephrology | Admitting: Nephrology

## 2018-11-12 DIAGNOSIS — D631 Anemia in chronic kidney disease: Secondary | ICD-10-CM | POA: Insufficient documentation

## 2018-11-12 DIAGNOSIS — N189 Chronic kidney disease, unspecified: Secondary | ICD-10-CM | POA: Diagnosis not present

## 2018-11-12 MED ORDER — SODIUM CHLORIDE 0.9 % IV SOLN
510.0000 mg | INTRAVENOUS | Status: DC
  Administered 2018-11-12: 11:00:00 510 mg via INTRAVENOUS
  Filled 2018-11-12: qty 17

## 2018-11-21 ENCOUNTER — Telehealth: Payer: Self-pay | Admitting: Cardiology

## 2018-11-21 NOTE — Telephone Encounter (Signed)
New Message        COVID-19 Pre-Screening Questions:   In the past 7 to 10 days have you had a cough,  shortness of breath, headache, congestion, fever (100 or greater) body aches, chills, sore throat, or sudden loss of taste or sense of smell? NO  Have you been around anyone with known Covid 19. NO  Have you been around anyone who is awaiting Covid 19 test results in the past 7 to 10 days? NO  Have you been around anyone who has been exposed to Covid 19, or has mentioned symptoms of Covid 19 within the past 7 to 10 days? NO Pts daughter said she needs to assist the pt for medical purposes. She answered NO to all covid questions   If you have any concerns/questions about symptoms patients report during screening (either on the phone or at threshold). Contact the provider seeing the patient or DOD for further guidance.  If neither are available contact a member of the leadership team.

## 2018-11-22 ENCOUNTER — Other Ambulatory Visit: Payer: Self-pay | Admitting: Cardiology

## 2018-11-22 ENCOUNTER — Ambulatory Visit (INDEPENDENT_AMBULATORY_CARE_PROVIDER_SITE_OTHER): Payer: Medicare Other | Admitting: Cardiology

## 2018-11-22 ENCOUNTER — Encounter (INDEPENDENT_AMBULATORY_CARE_PROVIDER_SITE_OTHER): Payer: Self-pay

## 2018-11-22 ENCOUNTER — Encounter: Payer: Self-pay | Admitting: Cardiology

## 2018-11-22 ENCOUNTER — Other Ambulatory Visit: Payer: Self-pay

## 2018-11-22 VITALS — BP 128/50 | HR 47 | Ht 62.0 in | Wt 183.4 lb

## 2018-11-22 DIAGNOSIS — I4821 Permanent atrial fibrillation: Secondary | ICD-10-CM

## 2018-11-22 DIAGNOSIS — E119 Type 2 diabetes mellitus without complications: Secondary | ICD-10-CM | POA: Diagnosis not present

## 2018-11-22 DIAGNOSIS — Z5181 Encounter for therapeutic drug level monitoring: Secondary | ICD-10-CM | POA: Diagnosis not present

## 2018-11-22 DIAGNOSIS — I700 Atherosclerosis of aorta: Secondary | ICD-10-CM | POA: Diagnosis not present

## 2018-11-22 DIAGNOSIS — I10 Essential (primary) hypertension: Secondary | ICD-10-CM

## 2018-11-22 DIAGNOSIS — I5032 Chronic diastolic (congestive) heart failure: Secondary | ICD-10-CM | POA: Diagnosis not present
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
## 2018-11-22 NOTE — Patient Instructions (Signed)
Medication Instructions:  The current medical regimen is effective;  continue present plan and medications.  If you need a refill on your cardiac medications before your next appointment, please call your pharmacy.   Follow-Up: At [facility], you and your health needs are our priority.  As part of our continuing mission to provide you with exceptional heart care, we have created designated Provider Care Teams.  These Care Teams include your primary Cardiologist (physician) and Advanced Practice Providers (APPs -  Physician Assistants and Nurse Practitioners) who all work together to provide you with the care you need, when you need it. You will need a follow up appointment in 6 months.  Please call our office 2 months in advance to schedule this appointment.  You may see Mark Skains, MD or one of the following Advanced Practice Providers on your designated Care Team:   Lori Gerhardt, NP Laura Ingold, NP . Jill McDaniel, NP  Thank you for choosing [facility]!!       

## 2018-11-23 ENCOUNTER — Ambulatory Visit (INDEPENDENT_AMBULATORY_CARE_PROVIDER_SITE_OTHER): Payer: Medicare Other | Admitting: Cardiology

## 2018-11-23 DIAGNOSIS — I4891 Unspecified atrial fibrillation: Secondary | ICD-10-CM

## 2018-11-23 DIAGNOSIS — Z5181 Encounter for therapeutic drug level monitoring: Secondary | ICD-10-CM

## 2018-11-23 LAB — POCT INR: INR: 1.6 — AB (ref 2.0–3.0)

## 2018-11-23 NOTE — Patient Instructions (Signed)
Description   Spoke with [name]-pt's dtr & instructed to have pt take 1.5 tablet today and 2.5 tablets tomorrow, then change dose to  2 tablets daily except 1 tablet on Mondays and Fridays. Recheck INR in 2 weeks.  Call if placed on any new medications [phone].

## 2018-12-03 ENCOUNTER — Other Ambulatory Visit: Payer: Self-pay

## 2018-12-03 ENCOUNTER — Ambulatory Visit (HOSPITAL_COMMUNITY)
Admission: RE | Admit: 2018-12-03 | Discharge: 2018-12-03 | Disposition: A | Discharge status: Home / Self Care | Payer: Medicare Other | Source: Ambulatory Visit | Attending: Nephrology | Admitting: Nephrology

## 2018-12-03 VITALS — BP 133/44 | HR 53 | Temp 97.3°F | Resp 20

## 2018-12-03 DIAGNOSIS — N184 Chronic kidney disease, stage 4 (severe): Secondary | ICD-10-CM | POA: Insufficient documentation

## 2018-12-03 LAB — IRON AND TIBC
Iron: 32 ug/dL (ref 28–170)
Saturation Ratios: 18 % (ref 10.4–31.8)
TIBC: 175 ug/dL — ABNORMAL LOW (ref 250–450)
UIBC: 143 ug/dL

## 2018-12-03 LAB — POCT HEMOGLOBIN-HEMACUE: Hemoglobin: 9.5 g/dL — ABNORMAL LOW (ref 12.0–15.0)

## 2018-12-03 LAB — FERRITIN: Ferritin: 911 ng/mL — ABNORMAL HIGH (ref 11–307)

## 2018-12-03 MED ORDER — EPOETIN ALFA 20000 UNIT/ML IJ SOLN
INTRAMUSCULAR | Status: AC
  Administered 2018-12-03: 11:00:00 20000 [IU] via SUBCUTANEOUS
  Filled 2018-12-03: qty 1

## 2018-12-03 MED ORDER — EPOETIN ALFA 20000 UNIT/ML IJ SOLN
20000.0000 [IU] | INTRAMUSCULAR | Status: DC
  Administered 2018-12-03: 20000 [IU] via SUBCUTANEOUS

## 2018-12-07 ENCOUNTER — Ambulatory Visit (INDEPENDENT_AMBULATORY_CARE_PROVIDER_SITE_OTHER): Payer: Medicare Other | Admitting: Internal Medicine

## 2018-12-07 DIAGNOSIS — Z5181 Encounter for therapeutic drug level monitoring: Secondary | ICD-10-CM

## 2018-12-07 DIAGNOSIS — Z7901 Long term (current) use of anticoagulants: Secondary | ICD-10-CM

## 2018-12-07 DIAGNOSIS — I4891 Unspecified atrial fibrillation: Secondary | ICD-10-CM

## 2018-12-07 LAB — POCT INR: INR: 2.1 (ref 2.0–3.0)

## 2018-12-07 NOTE — Patient Instructions (Signed)
Description   Spoke with Madeline [name]-pt's dtr & instructed to have pt to continue taking 2 tablets daily except 1 tablet on Mondays and Fridays. Recheck INR in 2 weeks.  Call if placed on any new medications [phone].

## 2018-12-10 ENCOUNTER — Encounter (HOSPITAL_COMMUNITY): Payer: Medicare Other

## 2018-12-21 ENCOUNTER — Ambulatory Visit (INDEPENDENT_AMBULATORY_CARE_PROVIDER_SITE_OTHER): Payer: Medicare Other | Admitting: Pharmacist

## 2018-12-21 DIAGNOSIS — I4891 Unspecified atrial fibrillation: Secondary | ICD-10-CM | POA: Diagnosis not present

## 2018-12-21 DIAGNOSIS — Z7901 Long term (current) use of anticoagulants: Secondary | ICD-10-CM | POA: Diagnosis not present

## 2018-12-21 DIAGNOSIS — Z5181 Encounter for therapeutic drug level monitoring: Secondary | ICD-10-CM | POA: Diagnosis not present

## 2018-12-21 LAB — POCT INR: INR: 2.1 (ref 2.0–3.0)

## 2018-12-31 ENCOUNTER — Other Ambulatory Visit: Payer: Self-pay

## 2018-12-31 ENCOUNTER — Encounter (HOSPITAL_COMMUNITY)
Admission: RE | Admit: 2018-12-31 | Discharge: 2018-12-31 | Disposition: A | Discharge status: Home / Self Care | Payer: Medicare Other | Source: Ambulatory Visit | Attending: Nephrology | Admitting: Nephrology

## 2018-12-31 VITALS — BP 146/53 | HR 52 | Temp 97.1°F | Resp 20

## 2018-12-31 DIAGNOSIS — N184 Chronic kidney disease, stage 4 (severe): Secondary | ICD-10-CM | POA: Insufficient documentation

## 2018-12-31 LAB — IRON AND TIBC
Iron: 33 ug/dL (ref 28–170)
Saturation Ratios: 19 % (ref 10.4–31.8)
TIBC: 176 ug/dL — ABNORMAL LOW (ref 250–450)
UIBC: 143 ug/dL

## 2018-12-31 LAB — POCT HEMOGLOBIN-HEMACUE: Hemoglobin: 9.9 g/dL — ABNORMAL LOW (ref 12.0–15.0)

## 2018-12-31 LAB — FERRITIN: Ferritin: 773 ng/mL — ABNORMAL HIGH (ref 11–307)

## 2018-12-31 MED ORDER — EPOETIN ALFA 20000 UNIT/ML IJ SOLN
INTRAMUSCULAR | Status: AC
  Administered 2018-12-31: 20000 [IU] via SUBCUTANEOUS
  Filled 2018-12-31: qty 1

## 2018-12-31 MED ORDER — EPOETIN ALFA 10000 UNIT/ML IJ SOLN: 20000.0000 [IU] | INTRAMUSCULAR | Status: DC

## 2019-01-04 ENCOUNTER — Ambulatory Visit (INDEPENDENT_AMBULATORY_CARE_PROVIDER_SITE_OTHER): Payer: Medicare Other | Admitting: Interventional Cardiology

## 2019-01-04 DIAGNOSIS — Z5181 Encounter for therapeutic drug level monitoring: Secondary | ICD-10-CM | POA: Diagnosis not present

## 2019-01-04 DIAGNOSIS — I4891 Unspecified atrial fibrillation: Secondary | ICD-10-CM

## 2019-01-04 LAB — POCT INR: INR: 2.1 (ref 2.0–3.0)

## 2019-01-04 NOTE — Patient Instructions (Signed)
Description   Spoke with Madeline [name]-pt's dtr & instructed to have pt to continue taking 2 tablets daily except 1 tablet on Mondays and Fridays. Recheck INR in 2 weeks.  Call if placed on any new medications [phone].     

## 2019-01-18 ENCOUNTER — Ambulatory Visit (INDEPENDENT_AMBULATORY_CARE_PROVIDER_SITE_OTHER): Payer: Medicare Other | Admitting: Cardiovascular Disease

## 2019-01-18 DIAGNOSIS — I4891 Unspecified atrial fibrillation: Secondary | ICD-10-CM

## 2019-01-18 DIAGNOSIS — Z5181 Encounter for therapeutic drug level monitoring: Secondary | ICD-10-CM | POA: Diagnosis not present

## 2019-01-18 LAB — POCT INR: INR: 2.4 (ref 2.0–3.0)

## 2019-01-18 NOTE — Patient Instructions (Signed)
Description   Spoke with Madeline [name]-pt's dtr & instructed to have pt to continue taking 2 tablets daily except 1 tablet on Mondays and Fridays. Recheck INR in 2 weeks.  Call if placed on any new medications [phone].     

## 2019-01-28 ENCOUNTER — Other Ambulatory Visit: Payer: Self-pay

## 2019-01-28 ENCOUNTER — Ambulatory Visit (HOSPITAL_COMMUNITY)
Admission: RE | Admit: 2019-01-28 | Discharge: 2019-01-28 | Disposition: A | Discharge status: Home / Self Care | Payer: Medicare Other | Source: Ambulatory Visit | Attending: Nephrology | Admitting: Nephrology

## 2019-01-28 VITALS — BP 152/48 | HR 62 | Temp 97.1°F | Resp 20

## 2019-01-28 DIAGNOSIS — D631 Anemia in chronic kidney disease: Secondary | ICD-10-CM | POA: Insufficient documentation

## 2019-01-28 DIAGNOSIS — N184 Chronic kidney disease, stage 4 (severe): Secondary | ICD-10-CM | POA: Insufficient documentation

## 2019-01-28 LAB — IRON AND TIBC
Iron: 34 ug/dL (ref 28–170)
Saturation Ratios: 20 % (ref 10.4–31.8)
TIBC: 169 ug/dL — ABNORMAL LOW (ref 250–450)
UIBC: 135 ug/dL

## 2019-01-28 LAB — POCT HEMOGLOBIN-HEMACUE: Hemoglobin: 10.9 g/dL — ABNORMAL LOW (ref 12.0–15.0)

## 2019-01-28 LAB — FERRITIN: Ferritin: 799 ng/mL — ABNORMAL HIGH (ref 11–307)

## 2019-01-28 MED ORDER — EPOETIN ALFA 10000 UNIT/ML IJ SOLN: 20000.0000 [IU] | INTRAMUSCULAR | Status: DC

## 2019-01-28 MED ORDER — EPOETIN ALFA 20000 UNIT/ML IJ SOLN
INTRAMUSCULAR | Status: AC
  Administered 2019-01-28: 20000 [IU] via SUBCUTANEOUS
  Filled 2019-01-28: qty 1

## 2019-02-01 ENCOUNTER — Ambulatory Visit (INDEPENDENT_AMBULATORY_CARE_PROVIDER_SITE_OTHER): Payer: Medicare Other | Admitting: Internal Medicine

## 2019-02-01 DIAGNOSIS — Z5181 Encounter for therapeutic drug level monitoring: Secondary | ICD-10-CM | POA: Diagnosis not present

## 2019-02-01 LAB — POCT INR: INR: 2.7 (ref 2.0–3.0)

## 2019-02-01 NOTE — Patient Instructions (Signed)
Description   Spoke with [name]-pt's dtr & instructed to have pt to continue taking 2 tablets daily except 1 tablet on Mondays and Fridays. Recheck INR in 2 weeks.  Call if placed on any new medications [phone].     

## 2019-02-15 ENCOUNTER — Ambulatory Visit (INDEPENDENT_AMBULATORY_CARE_PROVIDER_SITE_OTHER): Payer: Medicare Other

## 2019-02-15 DIAGNOSIS — Z5181 Encounter for therapeutic drug level monitoring: Secondary | ICD-10-CM | POA: Diagnosis not present

## 2019-02-15 DIAGNOSIS — Z7901 Long term (current) use of anticoagulants: Secondary | ICD-10-CM | POA: Diagnosis not present

## 2019-02-15 DIAGNOSIS — I4891 Unspecified atrial fibrillation: Secondary | ICD-10-CM

## 2019-02-15 LAB — POCT INR: INR: 1.7 — AB (ref 2.0–3.0)

## 2019-02-15 NOTE — Patient Instructions (Signed)
Description   Spoke with Madeline [name]-pt's dtr & instructed to have pt to take 2 tablets today, then resume same dosage 2 tablets daily except 1 tablet on Mondays and Fridays. Recheck INR in 2 weeks.  Call if placed on any new medications [phone].

## 2019-02-25 ENCOUNTER — Other Ambulatory Visit: Payer: Self-pay

## 2019-02-25 ENCOUNTER — Ambulatory Visit (HOSPITAL_COMMUNITY)
Admission: RE | Admit: 2019-02-25 | Discharge: 2019-02-25 | Disposition: A | Discharge status: Home / Self Care | Payer: Medicare Other | Source: Ambulatory Visit | Attending: Nephrology | Admitting: Nephrology

## 2019-02-25 VITALS — BP 154/45 | HR 66 | Temp 97.2°F | Resp 20

## 2019-02-25 DIAGNOSIS — N184 Chronic kidney disease, stage 4 (severe): Secondary | ICD-10-CM | POA: Diagnosis not present

## 2019-02-25 LAB — IRON AND TIBC
Iron: 33 ug/dL (ref 28–170)
Saturation Ratios: 18 % (ref 10.4–31.8)
TIBC: 182 ug/dL — ABNORMAL LOW (ref 250–450)
UIBC: 149 ug/dL

## 2019-02-25 LAB — POCT HEMOGLOBIN-HEMACUE: Hemoglobin: 10.6 g/dL — ABNORMAL LOW (ref 12.0–15.0)

## 2019-02-25 LAB — FERRITIN: Ferritin: 878 ng/mL — ABNORMAL HIGH (ref 11–307)

## 2019-02-25 MED ORDER — EPOETIN ALFA 10000 UNIT/ML IJ SOLN: 20000.0000 [IU] | INTRAMUSCULAR | Status: DC

## 2019-02-25 MED ORDER — EPOETIN ALFA 20000 UNIT/ML IJ SOLN
INTRAMUSCULAR | Status: AC
  Administered 2019-02-25: 11:00:00 20000 [IU]
  Filled 2019-02-25: qty 1

## 2019-02-27 ENCOUNTER — Other Ambulatory Visit: Payer: Self-pay | Admitting: Cardiology

## 2019-03-01 ENCOUNTER — Ambulatory Visit (INDEPENDENT_AMBULATORY_CARE_PROVIDER_SITE_OTHER): Payer: Medicare Other | Admitting: Cardiovascular Disease

## 2019-03-01 DIAGNOSIS — I4891 Unspecified atrial fibrillation: Secondary | ICD-10-CM

## 2019-03-01 DIAGNOSIS — Z5181 Encounter for therapeutic drug level monitoring: Secondary | ICD-10-CM

## 2019-03-01 LAB — POCT INR: INR: 2.4 (ref 2.0–3.0)

## 2019-03-01 NOTE — Patient Instructions (Signed)
Description   Spoke with [name]-pt's dtr & instructed to have pt to continue taking 2 tablets daily except 1 tablet on Mondays and Fridays. Recheck INR in 2 weeks.  Call if placed on any new medications [phone].     

## 2019-03-05 DIAGNOSIS — Z23 Encounter for immunization: Secondary | ICD-10-CM | POA: Diagnosis not present

## 2019-03-15 ENCOUNTER — Ambulatory Visit (INDEPENDENT_AMBULATORY_CARE_PROVIDER_SITE_OTHER): Payer: Medicare Other | Admitting: Cardiovascular Disease

## 2019-03-15 DIAGNOSIS — Z5181 Encounter for therapeutic drug level monitoring: Secondary | ICD-10-CM

## 2019-03-15 DIAGNOSIS — I4891 Unspecified atrial fibrillation: Secondary | ICD-10-CM

## 2019-03-15 LAB — POCT INR: INR: 2.4 (ref 2.0–3.0)

## 2019-03-25 ENCOUNTER — Other Ambulatory Visit: Payer: Self-pay

## 2019-03-25 ENCOUNTER — Encounter (HOSPITAL_COMMUNITY)
Admission: RE | Admit: 2019-03-25 | Discharge: 2019-03-25 | Disposition: A | Discharge status: Home / Self Care | Payer: Medicare Other | Source: Ambulatory Visit | Attending: Nephrology | Admitting: Nephrology

## 2019-03-25 VITALS — BP 167/49 | HR 59 | Temp 96.5°F | Resp 18

## 2019-03-25 DIAGNOSIS — N184 Chronic kidney disease, stage 4 (severe): Secondary | ICD-10-CM | POA: Diagnosis not present

## 2019-03-25 LAB — IRON AND TIBC
Iron: 39 ug/dL (ref 28–170)
Saturation Ratios: 21 % (ref 10.4–31.8)
TIBC: 182 ug/dL — ABNORMAL LOW (ref 250–450)
UIBC: 143 ug/dL

## 2019-03-25 LAB — FERRITIN: Ferritin: 979 ng/mL — ABNORMAL HIGH (ref 11–307)

## 2019-03-25 LAB — POCT HEMOGLOBIN-HEMACUE: Hemoglobin: 10.4 g/dL — ABNORMAL LOW (ref 12.0–15.0)

## 2019-03-25 MED ORDER — EPOETIN ALFA 20000 UNIT/ML IJ SOLN
INTRAMUSCULAR | Status: AC
  Filled 2019-03-25: qty 1

## 2019-03-25 MED ORDER — EPOETIN ALFA 10000 UNIT/ML IJ SOLN
20000.0000 [IU] | INTRAMUSCULAR | Status: DC
  Administered 2019-03-25: 20000 [IU] via SUBCUTANEOUS

## 2019-03-26 MED FILL — Epoetin Alfa Inj 20000 Unit/ML: INTRAMUSCULAR | Qty: 1 | Status: AC

## 2019-03-28 DIAGNOSIS — Z20828 Contact with and (suspected) exposure to other viral communicable diseases: Secondary | ICD-10-CM | POA: Diagnosis not present

## 2019-04-01 ENCOUNTER — Telehealth: Payer: Self-pay | Admitting: Cardiology

## 2019-04-01 ENCOUNTER — Ambulatory Visit (INDEPENDENT_AMBULATORY_CARE_PROVIDER_SITE_OTHER): Payer: Medicare Other | Admitting: Internal Medicine

## 2019-04-01 ENCOUNTER — Telehealth: Payer: Self-pay | Admitting: *Deleted

## 2019-04-01 DIAGNOSIS — I4891 Unspecified atrial fibrillation: Secondary | ICD-10-CM | POA: Diagnosis not present

## 2019-04-01 DIAGNOSIS — Z5181 Encounter for therapeutic drug level monitoring: Secondary | ICD-10-CM | POA: Diagnosis not present

## 2019-04-01 DIAGNOSIS — R0989 Other specified symptoms and signs involving the circulatory and respiratory systems: Secondary | ICD-10-CM | POA: Diagnosis not present

## 2019-04-01 LAB — POCT INR: INR: 2.2 (ref 2.0–3.0)

## 2019-04-01 NOTE — Patient Instructions (Signed)
Description   Spoke with [name]-pt's dtr & instructed to have pt to continue taking 2 tablets daily except 1 tablet on Mondays and Fridays. Recheck INR in 2 weeks.  Call if placed on any new medications [phone].     

## 2019-04-01 NOTE — Telephone Encounter (Signed)
Already addressed by  CC.  Pt can take and they will recheck INR earlier.  Daughter is aware.

## 2019-04-01 NOTE — Telephone Encounter (Signed)
Pt's daughter called stating pt's PCP started her on a 10 day course of Doxycyline 100 mg BID. Pt is suppose to take first dose tonight. Pt's daughter was wondering if it is okay to take. Spoke with pharm D okay for pt to take.  However, pt will need to get her INR checked again on 11/25. Pt's daughter made aware that doxy can interact with coumadin and that is why we want to check pt's INR earlier. Put pt's name in coumadin clinic black book for follow up.

## 2019-04-01 NOTE — Telephone Encounter (Signed)
Pt c/o medication issue:  1. Name of Medication: Doxycycline Hyclate 100 MG twice a day   2. How are you currently taking this medication (dosage and times per day)? No   3. Are you having a reaction (difficulty breathing--STAT)? No   4. What is your medication issue? Patient had appointment with PCP today and was prescribed Doxycycline Hyclate 100 MG for bronchitis. Patient's daughter wants to make sure medication prescribed will not interfere with heart condition or an medications before giving it to Patient. Please advise.

## 2019-04-03 ENCOUNTER — Ambulatory Visit (INDEPENDENT_AMBULATORY_CARE_PROVIDER_SITE_OTHER): Payer: Medicare Other

## 2019-04-03 DIAGNOSIS — Z5181 Encounter for therapeutic drug level monitoring: Secondary | ICD-10-CM | POA: Diagnosis not present

## 2019-04-03 DIAGNOSIS — I4891 Unspecified atrial fibrillation: Secondary | ICD-10-CM | POA: Diagnosis not present

## 2019-04-03 LAB — POCT INR: INR: 1.9 — AB (ref 2.0–3.0)

## 2019-04-03 NOTE — Patient Instructions (Signed)
Description   Spoke with [name]-pt's dtr & instructed to have pt to continue taking 2 tablets daily except 1 tablet on Mondays and Fridays. Recheck INR on Monday 11/30 given start of Doxycycline 100mg  BID x 10 days on 04/01/19. Call if placed on any new medications [phone].

## 2019-04-08 ENCOUNTER — Ambulatory Visit (INDEPENDENT_AMBULATORY_CARE_PROVIDER_SITE_OTHER): Payer: Medicare Other | Admitting: *Deleted

## 2019-04-08 DIAGNOSIS — I482 Chronic atrial fibrillation, unspecified: Secondary | ICD-10-CM | POA: Diagnosis not present

## 2019-04-08 DIAGNOSIS — E1122 Type 2 diabetes mellitus with diabetic chronic kidney disease: Secondary | ICD-10-CM | POA: Diagnosis not present

## 2019-04-08 DIAGNOSIS — N184 Chronic kidney disease, stage 4 (severe): Secondary | ICD-10-CM | POA: Diagnosis not present

## 2019-04-08 DIAGNOSIS — Z7901 Long term (current) use of anticoagulants: Secondary | ICD-10-CM | POA: Diagnosis not present

## 2019-04-08 DIAGNOSIS — I130 Hypertensive heart and chronic kidney disease with heart failure and stage 1 through stage 4 chronic kidney disease, or unspecified chronic kidney disease: Secondary | ICD-10-CM | POA: Diagnosis not present

## 2019-04-08 DIAGNOSIS — Z5181 Encounter for therapeutic drug level monitoring: Secondary | ICD-10-CM | POA: Diagnosis not present

## 2019-04-08 DIAGNOSIS — Z0000 Encounter for general adult medical examination without abnormal findings: Secondary | ICD-10-CM | POA: Diagnosis not present

## 2019-04-08 DIAGNOSIS — I2510 Atherosclerotic heart disease of native coronary artery without angina pectoris: Secondary | ICD-10-CM | POA: Diagnosis not present

## 2019-04-08 DIAGNOSIS — I4891 Unspecified atrial fibrillation: Secondary | ICD-10-CM | POA: Diagnosis not present

## 2019-04-08 DIAGNOSIS — E890 Postprocedural hypothyroidism: Secondary | ICD-10-CM | POA: Diagnosis not present

## 2019-04-08 DIAGNOSIS — R54 Age-related physical debility: Secondary | ICD-10-CM | POA: Diagnosis not present

## 2019-04-08 LAB — POCT INR: INR: 2.5 (ref 2.0–3.0)

## 2019-04-08 NOTE — Patient Instructions (Signed)
Description   Spoke with [name]-pt's dtr & instructed to have pt to continue taking 2 tablets daily except 1 tablet on Mondays and Fridays. Recheck INR on 04/19/2019. Call if placed on any new medications [phone].

## 2019-04-16 DIAGNOSIS — E1122 Type 2 diabetes mellitus with diabetic chronic kidney disease: Secondary | ICD-10-CM | POA: Diagnosis not present

## 2019-04-16 DIAGNOSIS — I482 Chronic atrial fibrillation, unspecified: Secondary | ICD-10-CM | POA: Diagnosis not present

## 2019-04-16 DIAGNOSIS — Z9089 Acquired absence of other organs: Secondary | ICD-10-CM | POA: Diagnosis not present

## 2019-04-16 DIAGNOSIS — I2510 Atherosclerotic heart disease of native coronary artery without angina pectoris: Secondary | ICD-10-CM | POA: Diagnosis not present

## 2019-04-16 DIAGNOSIS — I0981 Rheumatic heart failure: Secondary | ICD-10-CM | POA: Diagnosis not present

## 2019-04-16 DIAGNOSIS — N184 Chronic kidney disease, stage 4 (severe): Secondary | ICD-10-CM | POA: Diagnosis not present

## 2019-04-16 DIAGNOSIS — I5032 Chronic diastolic (congestive) heart failure: Secondary | ICD-10-CM | POA: Diagnosis not present

## 2019-04-16 DIAGNOSIS — R54 Age-related physical debility: Secondary | ICD-10-CM | POA: Diagnosis not present

## 2019-04-16 DIAGNOSIS — Z9981 Dependence on supplemental oxygen: Secondary | ICD-10-CM | POA: Diagnosis not present

## 2019-04-16 DIAGNOSIS — M170 Bilateral primary osteoarthritis of knee: Secondary | ICD-10-CM | POA: Diagnosis not present

## 2019-04-16 DIAGNOSIS — Z87891 Personal history of nicotine dependence: Secondary | ICD-10-CM | POA: Diagnosis not present

## 2019-04-16 DIAGNOSIS — I130 Hypertensive heart and chronic kidney disease with heart failure and stage 1 through stage 4 chronic kidney disease, or unspecified chronic kidney disease: Secondary | ICD-10-CM | POA: Diagnosis not present

## 2019-04-16 DIAGNOSIS — E785 Hyperlipidemia, unspecified: Secondary | ICD-10-CM | POA: Diagnosis not present

## 2019-04-16 DIAGNOSIS — Z7901 Long term (current) use of anticoagulants: Secondary | ICD-10-CM | POA: Diagnosis not present

## 2019-04-16 DIAGNOSIS — Z79899 Other long term (current) drug therapy: Secondary | ICD-10-CM | POA: Diagnosis not present

## 2019-04-16 DIAGNOSIS — E890 Postprocedural hypothyroidism: Secondary | ICD-10-CM | POA: Diagnosis not present

## 2019-04-16 DIAGNOSIS — K219 Gastro-esophageal reflux disease without esophagitis: Secondary | ICD-10-CM | POA: Diagnosis not present

## 2019-04-16 DIAGNOSIS — I272 Pulmonary hypertension, unspecified: Secondary | ICD-10-CM | POA: Diagnosis not present

## 2019-04-16 DIAGNOSIS — I081 Rheumatic disorders of both mitral and tricuspid valves: Secondary | ICD-10-CM | POA: Diagnosis not present

## 2019-04-16 DIAGNOSIS — Z7984 Long term (current) use of oral hypoglycemic drugs: Secondary | ICD-10-CM | POA: Diagnosis not present

## 2019-04-19 ENCOUNTER — Ambulatory Visit (INDEPENDENT_AMBULATORY_CARE_PROVIDER_SITE_OTHER): Payer: Medicare Other | Admitting: Internal Medicine

## 2019-04-19 DIAGNOSIS — Z5181 Encounter for therapeutic drug level monitoring: Secondary | ICD-10-CM | POA: Diagnosis not present

## 2019-04-19 LAB — POCT INR: INR: 2.5 (ref 2.0–3.0)

## 2019-04-19 NOTE — Patient Instructions (Signed)
Description   Spoke with [name]-pt's dtr & instructed to have pt to continue taking 2 tablets daily except 1 tablet on Mondays and Fridays. Recheck INR on 05/06/2019. Call if placed on any new medications [phone].

## 2019-04-22 ENCOUNTER — Other Ambulatory Visit: Payer: Self-pay

## 2019-04-22 ENCOUNTER — Encounter (HOSPITAL_COMMUNITY)
Admission: RE | Admit: 2019-04-22 | Discharge: 2019-04-22 | Disposition: A | Discharge status: Home / Self Care | Payer: Medicare Other | Source: Ambulatory Visit | Attending: Nephrology | Admitting: Nephrology

## 2019-04-22 VITALS — BP 162/49 | HR 52 | Temp 97.1°F | Resp 20

## 2019-04-22 DIAGNOSIS — N184 Chronic kidney disease, stage 4 (severe): Secondary | ICD-10-CM | POA: Insufficient documentation

## 2019-04-22 LAB — IRON AND TIBC
Iron: 36 ug/dL (ref 28–170)
Saturation Ratios: 19 % (ref 10.4–31.8)
TIBC: 188 ug/dL — ABNORMAL LOW (ref 250–450)
UIBC: 152 ug/dL

## 2019-04-22 LAB — FERRITIN: Ferritin: 822 ng/mL — ABNORMAL HIGH (ref 11–307)

## 2019-04-22 LAB — POCT HEMOGLOBIN-HEMACUE: Hemoglobin: 10.9 g/dL — ABNORMAL LOW (ref 12.0–15.0)

## 2019-04-22 MED ORDER — EPOETIN ALFA 20000 UNIT/ML IJ SOLN
INTRAMUSCULAR | Status: AC
  Administered 2019-04-22: 20000 [IU] via SUBCUTANEOUS
  Filled 2019-04-22: qty 1

## 2019-04-22 MED ORDER — EPOETIN ALFA 10000 UNIT/ML IJ SOLN: 20000.0000 [IU] | INTRAMUSCULAR | Status: DC

## 2019-04-23 DIAGNOSIS — I130 Hypertensive heart and chronic kidney disease with heart failure and stage 1 through stage 4 chronic kidney disease, or unspecified chronic kidney disease: Secondary | ICD-10-CM | POA: Diagnosis not present

## 2019-04-23 DIAGNOSIS — I2510 Atherosclerotic heart disease of native coronary artery without angina pectoris: Secondary | ICD-10-CM | POA: Diagnosis not present

## 2019-04-23 DIAGNOSIS — I5032 Chronic diastolic (congestive) heart failure: Secondary | ICD-10-CM | POA: Diagnosis not present

## 2019-04-23 DIAGNOSIS — I0981 Rheumatic heart failure: Secondary | ICD-10-CM | POA: Diagnosis not present

## 2019-04-23 DIAGNOSIS — N184 Chronic kidney disease, stage 4 (severe): Secondary | ICD-10-CM | POA: Diagnosis not present

## 2019-04-23 DIAGNOSIS — E1122 Type 2 diabetes mellitus with diabetic chronic kidney disease: Secondary | ICD-10-CM | POA: Diagnosis not present

## 2019-04-30 DIAGNOSIS — I0981 Rheumatic heart failure: Secondary | ICD-10-CM | POA: Diagnosis not present

## 2019-04-30 DIAGNOSIS — I2510 Atherosclerotic heart disease of native coronary artery without angina pectoris: Secondary | ICD-10-CM | POA: Diagnosis not present

## 2019-04-30 DIAGNOSIS — I130 Hypertensive heart and chronic kidney disease with heart failure and stage 1 through stage 4 chronic kidney disease, or unspecified chronic kidney disease: Secondary | ICD-10-CM | POA: Diagnosis not present

## 2019-04-30 DIAGNOSIS — N184 Chronic kidney disease, stage 4 (severe): Secondary | ICD-10-CM | POA: Diagnosis not present

## 2019-04-30 DIAGNOSIS — I5032 Chronic diastolic (congestive) heart failure: Secondary | ICD-10-CM | POA: Diagnosis not present

## 2019-04-30 DIAGNOSIS — E1122 Type 2 diabetes mellitus with diabetic chronic kidney disease: Secondary | ICD-10-CM | POA: Diagnosis not present

## 2019-05-06 ENCOUNTER — Ambulatory Visit (INDEPENDENT_AMBULATORY_CARE_PROVIDER_SITE_OTHER): Payer: Medicare Other | Admitting: Internal Medicine

## 2019-05-06 DIAGNOSIS — Z5181 Encounter for therapeutic drug level monitoring: Secondary | ICD-10-CM | POA: Diagnosis not present

## 2019-05-06 DIAGNOSIS — I130 Hypertensive heart and chronic kidney disease with heart failure and stage 1 through stage 4 chronic kidney disease, or unspecified chronic kidney disease: Secondary | ICD-10-CM | POA: Diagnosis not present

## 2019-05-06 DIAGNOSIS — E1122 Type 2 diabetes mellitus with diabetic chronic kidney disease: Secondary | ICD-10-CM | POA: Diagnosis not present

## 2019-05-06 DIAGNOSIS — I2510 Atherosclerotic heart disease of native coronary artery without angina pectoris: Secondary | ICD-10-CM | POA: Diagnosis not present

## 2019-05-06 DIAGNOSIS — N184 Chronic kidney disease, stage 4 (severe): Secondary | ICD-10-CM | POA: Diagnosis not present

## 2019-05-06 DIAGNOSIS — I0981 Rheumatic heart failure: Secondary | ICD-10-CM | POA: Diagnosis not present

## 2019-05-06 DIAGNOSIS — I5032 Chronic diastolic (congestive) heart failure: Secondary | ICD-10-CM | POA: Diagnosis not present

## 2019-05-06 LAB — POCT INR: INR: 3.2 — AB (ref 2.0–3.0)

## 2019-05-06 NOTE — Patient Instructions (Signed)
Description   Spoke with [name]-pt's dtr & instructed to have pt to hold warfarin tonight and then continue taking 2 tablets daily except 1 tablet on Mondays and Fridays. Recheck INR in 2 weeks. Call if placed on any new medications [phone].

## 2019-05-13 DIAGNOSIS — I2510 Atherosclerotic heart disease of native coronary artery without angina pectoris: Secondary | ICD-10-CM | POA: Diagnosis not present

## 2019-05-13 DIAGNOSIS — I130 Hypertensive heart and chronic kidney disease with heart failure and stage 1 through stage 4 chronic kidney disease, or unspecified chronic kidney disease: Secondary | ICD-10-CM | POA: Diagnosis not present

## 2019-05-13 DIAGNOSIS — E1122 Type 2 diabetes mellitus with diabetic chronic kidney disease: Secondary | ICD-10-CM | POA: Diagnosis not present

## 2019-05-13 DIAGNOSIS — N184 Chronic kidney disease, stage 4 (severe): Secondary | ICD-10-CM | POA: Diagnosis not present

## 2019-05-13 DIAGNOSIS — I0981 Rheumatic heart failure: Secondary | ICD-10-CM | POA: Diagnosis not present

## 2019-05-13 DIAGNOSIS — I5032 Chronic diastolic (congestive) heart failure: Secondary | ICD-10-CM | POA: Diagnosis not present

## 2019-05-16 DIAGNOSIS — Z87891 Personal history of nicotine dependence: Secondary | ICD-10-CM | POA: Diagnosis not present

## 2019-05-16 DIAGNOSIS — I0981 Rheumatic heart failure: Secondary | ICD-10-CM | POA: Diagnosis not present

## 2019-05-16 DIAGNOSIS — Z9981 Dependence on supplemental oxygen: Secondary | ICD-10-CM | POA: Diagnosis not present

## 2019-05-16 DIAGNOSIS — M170 Bilateral primary osteoarthritis of knee: Secondary | ICD-10-CM | POA: Diagnosis not present

## 2019-05-16 DIAGNOSIS — I2510 Atherosclerotic heart disease of native coronary artery without angina pectoris: Secondary | ICD-10-CM | POA: Diagnosis not present

## 2019-05-16 DIAGNOSIS — E785 Hyperlipidemia, unspecified: Secondary | ICD-10-CM | POA: Diagnosis not present

## 2019-05-16 DIAGNOSIS — M1711 Unilateral primary osteoarthritis, right knee: Secondary | ICD-10-CM | POA: Diagnosis not present

## 2019-05-16 DIAGNOSIS — M25561 Pain in right knee: Secondary | ICD-10-CM | POA: Diagnosis not present

## 2019-05-16 DIAGNOSIS — E1122 Type 2 diabetes mellitus with diabetic chronic kidney disease: Secondary | ICD-10-CM | POA: Diagnosis not present

## 2019-05-16 DIAGNOSIS — Z79899 Other long term (current) drug therapy: Secondary | ICD-10-CM | POA: Diagnosis not present

## 2019-05-16 DIAGNOSIS — Z7901 Long term (current) use of anticoagulants: Secondary | ICD-10-CM | POA: Diagnosis not present

## 2019-05-16 DIAGNOSIS — M1712 Unilateral primary osteoarthritis, left knee: Secondary | ICD-10-CM | POA: Diagnosis not present

## 2019-05-16 DIAGNOSIS — N184 Chronic kidney disease, stage 4 (severe): Secondary | ICD-10-CM | POA: Diagnosis not present

## 2019-05-16 DIAGNOSIS — I272 Pulmonary hypertension, unspecified: Secondary | ICD-10-CM | POA: Diagnosis not present

## 2019-05-16 DIAGNOSIS — I130 Hypertensive heart and chronic kidney disease with heart failure and stage 1 through stage 4 chronic kidney disease, or unspecified chronic kidney disease: Secondary | ICD-10-CM | POA: Diagnosis not present

## 2019-05-16 DIAGNOSIS — E890 Postprocedural hypothyroidism: Secondary | ICD-10-CM | POA: Diagnosis not present

## 2019-05-16 DIAGNOSIS — I081 Rheumatic disorders of both mitral and tricuspid valves: Secondary | ICD-10-CM | POA: Diagnosis not present

## 2019-05-16 DIAGNOSIS — Z9089 Acquired absence of other organs: Secondary | ICD-10-CM | POA: Diagnosis not present

## 2019-05-16 DIAGNOSIS — Z7984 Long term (current) use of oral hypoglycemic drugs: Secondary | ICD-10-CM | POA: Diagnosis not present

## 2019-05-16 DIAGNOSIS — M25562 Pain in left knee: Secondary | ICD-10-CM | POA: Diagnosis not present

## 2019-05-16 DIAGNOSIS — I482 Chronic atrial fibrillation, unspecified: Secondary | ICD-10-CM | POA: Diagnosis not present

## 2019-05-16 DIAGNOSIS — R54 Age-related physical debility: Secondary | ICD-10-CM | POA: Diagnosis not present

## 2019-05-16 DIAGNOSIS — I5032 Chronic diastolic (congestive) heart failure: Secondary | ICD-10-CM | POA: Diagnosis not present

## 2019-05-16 DIAGNOSIS — K219 Gastro-esophageal reflux disease without esophagitis: Secondary | ICD-10-CM | POA: Diagnosis not present

## 2019-05-17 ENCOUNTER — Ambulatory Visit (INDEPENDENT_AMBULATORY_CARE_PROVIDER_SITE_OTHER): Payer: Medicare Other | Admitting: *Deleted

## 2019-05-17 DIAGNOSIS — I4891 Unspecified atrial fibrillation: Secondary | ICD-10-CM | POA: Diagnosis not present

## 2019-05-17 DIAGNOSIS — Z5181 Encounter for therapeutic drug level monitoring: Secondary | ICD-10-CM | POA: Diagnosis not present

## 2019-05-17 LAB — POCT INR: INR: 2.1 (ref 2.0–3.0)

## 2019-05-17 NOTE — Patient Instructions (Signed)
Description   Spoke with [name]-pt's dtr & instructed to continue taking 2 tablets daily except 1 tablet on Mondays and Fridays. Recheck INR in 2 weeks. Call if placed on any new medications [phone].

## 2019-05-20 ENCOUNTER — Ambulatory Visit (HOSPITAL_COMMUNITY)
Admission: RE | Admit: 2019-05-20 | Discharge: 2019-05-20 | Disposition: A | Discharge status: Home / Self Care | Payer: Medicare Other | Source: Ambulatory Visit | Attending: Nephrology | Admitting: Nephrology

## 2019-05-20 ENCOUNTER — Other Ambulatory Visit: Payer: Self-pay | Admitting: Cardiology

## 2019-05-20 ENCOUNTER — Other Ambulatory Visit: Payer: Self-pay

## 2019-05-20 VITALS — BP 152/55 | HR 54 | Temp 97.3°F | Resp 20

## 2019-05-20 DIAGNOSIS — E1122 Type 2 diabetes mellitus with diabetic chronic kidney disease: Secondary | ICD-10-CM | POA: Diagnosis not present

## 2019-05-20 DIAGNOSIS — I0981 Rheumatic heart failure: Secondary | ICD-10-CM | POA: Diagnosis not present

## 2019-05-20 DIAGNOSIS — I5032 Chronic diastolic (congestive) heart failure: Secondary | ICD-10-CM | POA: Diagnosis not present

## 2019-05-20 DIAGNOSIS — I130 Hypertensive heart and chronic kidney disease with heart failure and stage 1 through stage 4 chronic kidney disease, or unspecified chronic kidney disease: Secondary | ICD-10-CM | POA: Diagnosis not present

## 2019-05-20 DIAGNOSIS — N184 Chronic kidney disease, stage 4 (severe): Secondary | ICD-10-CM

## 2019-05-20 DIAGNOSIS — I2510 Atherosclerotic heart disease of native coronary artery without angina pectoris: Secondary | ICD-10-CM | POA: Diagnosis not present

## 2019-05-20 LAB — FERRITIN: Ferritin: 811 ng/mL — ABNORMAL HIGH (ref 11–307)

## 2019-05-20 LAB — IRON AND TIBC
Iron: 34 ug/dL (ref 28–170)
Saturation Ratios: 19 % (ref 10.4–31.8)
TIBC: 178 ug/dL — ABNORMAL LOW (ref 250–450)
UIBC: 144 ug/dL

## 2019-05-20 LAB — POCT HEMOGLOBIN-HEMACUE: Hemoglobin: 10.6 g/dL — ABNORMAL LOW (ref 12.0–15.0)

## 2019-05-20 MED ORDER — EPOETIN ALFA 10000 UNIT/ML IJ SOLN: 20000.0000 [IU] | INTRAMUSCULAR | Status: DC

## 2019-05-20 MED ORDER — EPOETIN ALFA 10000 UNIT/ML IJ SOLN
INTRAMUSCULAR | Status: AC
  Filled 2019-05-20: qty 2

## 2019-05-20 MED ORDER — EPOETIN ALFA 20000 UNIT/ML IJ SOLN
INTRAMUSCULAR | Status: AC
  Administered 2019-05-20: 20000 [IU]
  Filled 2019-05-20: qty 1

## 2019-05-21 ENCOUNTER — Encounter: Payer: Self-pay | Admitting: Cardiology

## 2019-05-21 ENCOUNTER — Ambulatory Visit (INDEPENDENT_AMBULATORY_CARE_PROVIDER_SITE_OTHER): Payer: Medicare Other | Admitting: Cardiology

## 2019-05-21 ENCOUNTER — Other Ambulatory Visit: Payer: Self-pay

## 2019-05-21 VITALS — BP 156/42 | HR 57 | Ht 62.0 in | Wt 168.0 lb

## 2019-05-21 DIAGNOSIS — Z7901 Long term (current) use of anticoagulants: Secondary | ICD-10-CM | POA: Diagnosis not present

## 2019-05-21 DIAGNOSIS — I4821 Permanent atrial fibrillation: Secondary | ICD-10-CM | POA: Diagnosis not present

## 2019-05-21 DIAGNOSIS — N184 Chronic kidney disease, stage 4 (severe): Secondary | ICD-10-CM | POA: Diagnosis not present

## 2019-05-21 DIAGNOSIS — I700 Atherosclerosis of aorta: Secondary | ICD-10-CM | POA: Diagnosis not present

## 2019-05-21 NOTE — Patient Instructions (Signed)
Medication Instructions:  The current medical regimen is effective;  continue present plan and medications.  *If you need a refill on your cardiac medications before your next appointment, please call your pharmacy*  Follow-Up: At [facility], you and your health needs are our priority.  As part of our continuing mission to provide you with exceptional heart care, we have created designated Provider Care Teams.  These Care Teams include your primary Cardiologist (physician) and Advanced Practice Providers (APPs -  Physician Assistants and Nurse Practitioners) who all work together to provide you with the care you need, when you need it.  Your next appointment:   6 month(s)  The format for your next appointment:   In Person  Provider:   Candee Furbish, MD  Thank you for choosing [facility]!!
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
## 2019-05-23 DIAGNOSIS — M25562 Pain in left knee: Secondary | ICD-10-CM | POA: Diagnosis not present

## 2019-05-23 DIAGNOSIS — M1712 Unilateral primary osteoarthritis, left knee: Secondary | ICD-10-CM | POA: Diagnosis not present

## 2019-05-23 DIAGNOSIS — M170 Bilateral primary osteoarthritis of knee: Secondary | ICD-10-CM | POA: Diagnosis not present

## 2019-05-23 DIAGNOSIS — M25561 Pain in right knee: Secondary | ICD-10-CM | POA: Diagnosis not present

## 2019-05-23 DIAGNOSIS — M1711 Unilateral primary osteoarthritis, right knee: Secondary | ICD-10-CM | POA: Diagnosis not present

## 2019-05-30 DIAGNOSIS — M25561 Pain in right knee: Secondary | ICD-10-CM | POA: Diagnosis not present

## 2019-05-30 DIAGNOSIS — M25562 Pain in left knee: Secondary | ICD-10-CM | POA: Diagnosis not present

## 2019-05-30 DIAGNOSIS — I0981 Rheumatic heart failure: Secondary | ICD-10-CM | POA: Diagnosis not present

## 2019-05-30 DIAGNOSIS — I2510 Atherosclerotic heart disease of native coronary artery without angina pectoris: Secondary | ICD-10-CM | POA: Diagnosis not present

## 2019-05-30 DIAGNOSIS — M170 Bilateral primary osteoarthritis of knee: Secondary | ICD-10-CM | POA: Diagnosis not present

## 2019-05-30 DIAGNOSIS — I5032 Chronic diastolic (congestive) heart failure: Secondary | ICD-10-CM | POA: Diagnosis not present

## 2019-05-30 DIAGNOSIS — I130 Hypertensive heart and chronic kidney disease with heart failure and stage 1 through stage 4 chronic kidney disease, or unspecified chronic kidney disease: Secondary | ICD-10-CM | POA: Diagnosis not present

## 2019-05-30 DIAGNOSIS — N184 Chronic kidney disease, stage 4 (severe): Secondary | ICD-10-CM | POA: Diagnosis not present

## 2019-05-30 DIAGNOSIS — E1122 Type 2 diabetes mellitus with diabetic chronic kidney disease: Secondary | ICD-10-CM | POA: Diagnosis not present

## 2019-06-03 ENCOUNTER — Ambulatory Visit (INDEPENDENT_AMBULATORY_CARE_PROVIDER_SITE_OTHER): Payer: Medicare Other | Admitting: *Deleted

## 2019-06-03 DIAGNOSIS — Z5181 Encounter for therapeutic drug level monitoring: Secondary | ICD-10-CM | POA: Diagnosis not present

## 2019-06-03 DIAGNOSIS — Z7901 Long term (current) use of anticoagulants: Secondary | ICD-10-CM | POA: Diagnosis not present

## 2019-06-03 DIAGNOSIS — I2510 Atherosclerotic heart disease of native coronary artery without angina pectoris: Secondary | ICD-10-CM | POA: Diagnosis not present

## 2019-06-03 DIAGNOSIS — I4891 Unspecified atrial fibrillation: Secondary | ICD-10-CM | POA: Diagnosis not present

## 2019-06-03 DIAGNOSIS — I5032 Chronic diastolic (congestive) heart failure: Secondary | ICD-10-CM | POA: Diagnosis not present

## 2019-06-03 DIAGNOSIS — I130 Hypertensive heart and chronic kidney disease with heart failure and stage 1 through stage 4 chronic kidney disease, or unspecified chronic kidney disease: Secondary | ICD-10-CM | POA: Diagnosis not present

## 2019-06-03 DIAGNOSIS — N184 Chronic kidney disease, stage 4 (severe): Secondary | ICD-10-CM | POA: Diagnosis not present

## 2019-06-03 DIAGNOSIS — E1122 Type 2 diabetes mellitus with diabetic chronic kidney disease: Secondary | ICD-10-CM | POA: Diagnosis not present

## 2019-06-03 DIAGNOSIS — I0981 Rheumatic heart failure: Secondary | ICD-10-CM | POA: Diagnosis not present

## 2019-06-03 LAB — POCT INR: INR: 2 (ref 2.0–3.0)

## 2019-06-03 NOTE — Patient Instructions (Signed)
Description   Spoke with Madeline [name]-pt's dtr & instructed to continue taking 2 tablets daily except 1 tablet on Mondays and Fridays. Recheck INR in 2 weeks. Call if placed on any new medications [phone].

## 2019-06-11 DIAGNOSIS — I5032 Chronic diastolic (congestive) heart failure: Secondary | ICD-10-CM | POA: Diagnosis not present

## 2019-06-11 DIAGNOSIS — I0981 Rheumatic heart failure: Secondary | ICD-10-CM | POA: Diagnosis not present

## 2019-06-11 DIAGNOSIS — I2510 Atherosclerotic heart disease of native coronary artery without angina pectoris: Secondary | ICD-10-CM | POA: Diagnosis not present

## 2019-06-11 DIAGNOSIS — E1122 Type 2 diabetes mellitus with diabetic chronic kidney disease: Secondary | ICD-10-CM | POA: Diagnosis not present

## 2019-06-11 DIAGNOSIS — I130 Hypertensive heart and chronic kidney disease with heart failure and stage 1 through stage 4 chronic kidney disease, or unspecified chronic kidney disease: Secondary | ICD-10-CM | POA: Diagnosis not present

## 2019-06-11 DIAGNOSIS — N184 Chronic kidney disease, stage 4 (severe): Secondary | ICD-10-CM | POA: Diagnosis not present

## 2019-06-17 ENCOUNTER — Other Ambulatory Visit: Payer: Self-pay

## 2019-06-17 ENCOUNTER — Ambulatory Visit (INDEPENDENT_AMBULATORY_CARE_PROVIDER_SITE_OTHER): Payer: Medicare Other | Admitting: Internal Medicine

## 2019-06-17 ENCOUNTER — Ambulatory Visit (HOSPITAL_COMMUNITY)
Admission: RE | Admit: 2019-06-17 | Discharge: 2019-06-17 | Disposition: A | Discharge status: Home / Self Care | Payer: Medicare Other | Source: Ambulatory Visit | Attending: Nephrology | Admitting: Nephrology

## 2019-06-17 DIAGNOSIS — N184 Chronic kidney disease, stage 4 (severe): Secondary | ICD-10-CM | POA: Insufficient documentation

## 2019-06-17 DIAGNOSIS — Z5181 Encounter for therapeutic drug level monitoring: Secondary | ICD-10-CM | POA: Diagnosis not present

## 2019-06-17 LAB — IRON AND TIBC
Iron: 39 ug/dL (ref 28–170)
Saturation Ratios: 21 % (ref 10.4–31.8)
TIBC: 189 ug/dL — ABNORMAL LOW (ref 250–450)
UIBC: 150 ug/dL

## 2019-06-17 LAB — FERRITIN: Ferritin: 818 ng/mL — ABNORMAL HIGH (ref 11–307)

## 2019-06-17 LAB — POCT HEMOGLOBIN-HEMACUE: Hemoglobin: 11.1 g/dL — ABNORMAL LOW (ref 12.0–15.0)

## 2019-06-17 LAB — POCT INR: INR: 1.8 — AB (ref 2.0–3.0)

## 2019-06-17 MED ORDER — EPOETIN ALFA 10000 UNIT/ML IJ SOLN: 20000.0000 [IU] | INTRAMUSCULAR | Status: DC

## 2019-06-17 MED ORDER — EPOETIN ALFA 20000 UNIT/ML IJ SOLN
INTRAMUSCULAR | Status: AC
  Administered 2019-06-17: 20000 [IU] via SUBCUTANEOUS
  Filled 2019-06-17: qty 1

## 2019-06-17 NOTE — Patient Instructions (Signed)
Description   Spoke with Madeline [name]-pt's dtr & instructed for pt to take an extra 1/2 tablet today and then continue taking 2 tablets daily except 1 tablet on Mondays and Fridays. Recheck INR in 2 weeks. Call if placed on any new medications [phone].

## 2019-06-18 DIAGNOSIS — R3981 Functional urinary incontinence: Secondary | ICD-10-CM | POA: Diagnosis not present

## 2019-06-18 DIAGNOSIS — Z7189 Other specified counseling: Secondary | ICD-10-CM | POA: Diagnosis not present

## 2019-07-01 ENCOUNTER — Ambulatory Visit (INDEPENDENT_AMBULATORY_CARE_PROVIDER_SITE_OTHER): Payer: Medicare Other | Admitting: *Deleted

## 2019-07-01 DIAGNOSIS — Z5181 Encounter for therapeutic drug level monitoring: Secondary | ICD-10-CM

## 2019-07-01 DIAGNOSIS — I4891 Unspecified atrial fibrillation: Secondary | ICD-10-CM

## 2019-07-01 LAB — POCT INR: INR: 1.9 — AB (ref 2.0–3.0)

## 2019-07-01 NOTE — Patient Instructions (Signed)
Description   Spoke with [name]-pt's dtr & instructed for pt to start taking 2 tablets daily except 1 tablet on Fridays. Recheck INR in 2 weeks. Call if placed on any new medications [phone].

## 2019-07-11 DIAGNOSIS — R350 Frequency of micturition: Secondary | ICD-10-CM | POA: Diagnosis not present

## 2019-07-15 ENCOUNTER — Ambulatory Visit (INDEPENDENT_AMBULATORY_CARE_PROVIDER_SITE_OTHER): Payer: Medicare Other | Admitting: Internal Medicine

## 2019-07-15 ENCOUNTER — Ambulatory Visit (HOSPITAL_COMMUNITY)
Admission: RE | Admit: 2019-07-15 | Discharge: 2019-07-15 | Disposition: A | Discharge status: Home / Self Care | Payer: Medicare Other | Source: Ambulatory Visit | Attending: Nephrology | Admitting: Nephrology

## 2019-07-15 ENCOUNTER — Other Ambulatory Visit: Payer: Self-pay

## 2019-07-15 VITALS — BP 168/49 | HR 59 | Temp 97.2°F | Resp 20

## 2019-07-15 DIAGNOSIS — Z5181 Encounter for therapeutic drug level monitoring: Secondary | ICD-10-CM | POA: Diagnosis not present

## 2019-07-15 DIAGNOSIS — N184 Chronic kidney disease, stage 4 (severe): Secondary | ICD-10-CM | POA: Insufficient documentation

## 2019-07-15 LAB — IRON AND TIBC
Iron: 39 ug/dL (ref 28–170)
Saturation Ratios: 20 % (ref 10.4–31.8)
TIBC: 196 ug/dL — ABNORMAL LOW (ref 250–450)
UIBC: 157 ug/dL

## 2019-07-15 LAB — FERRITIN: Ferritin: 613 ng/mL — ABNORMAL HIGH (ref 11–307)

## 2019-07-15 LAB — POCT INR: INR: 2 (ref 2.0–3.0)

## 2019-07-15 LAB — POCT HEMOGLOBIN-HEMACUE: Hemoglobin: 10.4 g/dL — ABNORMAL LOW (ref 12.0–15.0)

## 2019-07-15 MED ORDER — EPOETIN ALFA 10000 UNIT/ML IJ SOLN: 20000.0000 [IU] | INTRAMUSCULAR | Status: DC

## 2019-07-15 MED ORDER — EPOETIN ALFA 20000 UNIT/ML IJ SOLN
INTRAMUSCULAR | Status: AC
  Administered 2019-07-15: 20000 [IU] via SUBCUTANEOUS
  Filled 2019-07-15: qty 1

## 2019-07-15 NOTE — Patient Instructions (Signed)
Description   Spoke with Madeline [name]-pt's dtr & instructed for pt to continue taking 2 tablets daily except 1 tablet on Fridays. Recheck INR in 2 weeks. Call if placed on any new medications [phone].

## 2019-07-23 ENCOUNTER — Telehealth: Payer: Self-pay | Admitting: Cardiology

## 2019-07-23 DIAGNOSIS — Z7409 Other reduced mobility: Secondary | ICD-10-CM

## 2019-07-23 DIAGNOSIS — I5032 Chronic diastolic (congestive) heart failure: Secondary | ICD-10-CM

## 2019-07-23 NOTE — Telephone Encounter (Signed)
   Pt's daughter called and would like to ask Dr. Marlou Porch if he can order palliative care for the pt. She said pt couldn't walk anymore and need help.  Please call

## 2019-07-23 NOTE — Telephone Encounter (Signed)
I spoke to the patient's daughter Randell Patient [phone] who called because her mother is becoming immobile and cannot walk anymore and would like palliative care.  She was wondering if Dr Marlou Porch could recommend it.  I told her that the patient's PCP may need to do that, but I would forward to him for further advisement.  She verbalized understanding.

## 2019-07-24 NOTE — Telephone Encounter (Signed)
Please have palliative care consult sent to her house set up. Thanks Candee Furbish, MD

## 2019-07-24 NOTE — Telephone Encounter (Signed)
Order placed for Palliative Care

## 2019-07-25 ENCOUNTER — Telehealth: Payer: Self-pay | Admitting: *Deleted

## 2019-07-25 NOTE — Telephone Encounter (Signed)
Received a Palliative care referral from Dr. Candee Furbish. Called and spoke with patient's daughter, Randell Patient, to schedule a home visit. Visit scheduled for 07/04/19 at 1:30p.

## 2019-07-26 NOTE — Telephone Encounter (Signed)
Conan Bowens, RN   [facility]  07/25/19 4:11 PM Note   Received a Palliative care referral from Dr. Candee Furbish. Called and spoke with patient's daughter, Randell Patient, to schedule a home visit. Visit scheduled for 07/04/19 at 1:30p.

## 2019-07-29 ENCOUNTER — Ambulatory Visit (INDEPENDENT_AMBULATORY_CARE_PROVIDER_SITE_OTHER): Payer: Medicare Other | Admitting: Pharmacist

## 2019-07-29 DIAGNOSIS — Z5181 Encounter for therapeutic drug level monitoring: Secondary | ICD-10-CM | POA: Diagnosis not present

## 2019-07-29 DIAGNOSIS — Z7901 Long term (current) use of anticoagulants: Secondary | ICD-10-CM | POA: Diagnosis not present

## 2019-07-29 DIAGNOSIS — I4891 Unspecified atrial fibrillation: Secondary | ICD-10-CM | POA: Diagnosis not present

## 2019-07-29 LAB — POCT INR: INR: 2.4 (ref 2.0–3.0)

## 2019-08-01 ENCOUNTER — Other Ambulatory Visit: Payer: Medicare Other | Admitting: *Deleted

## 2019-08-01 ENCOUNTER — Other Ambulatory Visit: Payer: Self-pay

## 2019-08-01 DIAGNOSIS — Z515 Encounter for palliative care: Secondary | ICD-10-CM

## 2019-08-05 NOTE — Progress Notes (Signed)
COMMUNITY PALLIATIVE CARE RN NOTE  PATIENT NAME: Madeline [name] DOB: 1925-05-16 MRN: [id number]  PRIMARY CARE PROVIDER: Corine Shelter, PA-C  RESPONSIBLE PARTY:  Acct ID - Guarantor Home Phone Work Phone Relationship Acct Type  [id number] Madeline [name], Madeline [name]* [phone]  Self P/F     [address] [place], [place] [address]   Covid-19 Pre-screening Negative  PLAN OF CARE and INTERVENTION:  1. ADVANCE CARE PLANNING/GOALS OF CARE: Goal is for patient to be able to safely transfer to her bedside commode independently and avoid hospitalizations. She has a DNR. 2. PATIENT/CAREGIVER EDUCATION: Explained palliative care services, symptom management, pain management, safe mobility/transfers 3. DISEASE STATUS: Met with patient and her 2 daughter's in the home. Upon arrival, patient is sitting up in her recliner awake and alert. She is alert and oriented x 3, pleasant and conversational. She does experience pain in bilateral hips and left knee. They are using CBD oil which has been helpful. She also takes Tramadol. No dyspnea noted during conversation, but does occur at times with exertion. Her daughter says that they have increased her oxygen from 2 L/min to 2.5 L/min via [place]. She wears oxygen throughout the night, and is now wearing some during the day. She takes Lasix daily and Metolazone twice weekly to manage her CHF. She does get up frequently, with assistance, during the night to urinate. She is usually able to fall back asleep ok. A Purewick system has been ordered so patient will not have to get up continually during the night. She says that her left knee buckles easily, making her a high fall risk. She is ambulatory using her walker with assistance, but a knee brace is used to help stabilize it. Most days she is transported via wheelchair in the home. She requires assistance with bathing and dressing. Her intake is normal. She is eating 3 meals/day of smaller portion sizes. She drinks 1/2 - 1 Ensure daily  for nutritional supplementation. She is diabetic. Her blood sugar this am was 99. After breakfast it increased to 207. She receives 10 units of insulin every other night. Patient is on Coumadin and  her daughter checks her INR at home every 2 weeks and calls results in to her MD. She receives a monthly injections to help with her hgb. Current Hgb is 10.4. She does have some soreness to her tail bone from sitting most of the day. Barrier creams being applied daily and Silvadene cream as needed. She does have a chair cushion. Patient and family agreeable to future palliative care visits.    HISTORY OF PRESENT ILLNESS:  This is a 86 yo female with a history of CHF, CKD Stage IV, DM, GERD, hyperlipidemia, Afib and HTN. Palliative care asked to follow patient for additional support. Will visit patient monthly and PRN.  CODE STATUS: DNR ADVANCED DIRECTIVES: Y MOST FORM: yes PPS: 40%   (Duration of visit and documentation 90 minutes)   Daryl Eastern, RN BSN

## 2019-08-14 ENCOUNTER — Ambulatory Visit (INDEPENDENT_AMBULATORY_CARE_PROVIDER_SITE_OTHER): Payer: Medicare Other

## 2019-08-14 DIAGNOSIS — Z5181 Encounter for therapeutic drug level monitoring: Secondary | ICD-10-CM | POA: Diagnosis not present

## 2019-08-14 DIAGNOSIS — I4891 Unspecified atrial fibrillation: Secondary | ICD-10-CM

## 2019-08-14 LAB — POCT INR: INR: 2 (ref 2.0–3.0)

## 2019-08-14 NOTE — Patient Instructions (Signed)
Description   Spoke with [name]-pt's dtr & instructed for pt to continue taking 2 tablets daily except 1 tablet on Fridays. Recheck INR in 2 weeks. Call if placed on any new medications [phone].

## 2019-08-26 ENCOUNTER — Other Ambulatory Visit: Payer: Self-pay

## 2019-08-26 ENCOUNTER — Ambulatory Visit (HOSPITAL_COMMUNITY)
Admission: RE | Admit: 2019-08-26 | Discharge: 2019-08-26 | Disposition: A | Discharge status: Home / Self Care | Payer: Medicare Other | Source: Ambulatory Visit | Attending: Nephrology | Admitting: Nephrology

## 2019-08-26 VITALS — BP 163/44 | HR 51 | Temp 96.5°F | Resp 20

## 2019-08-26 DIAGNOSIS — N184 Chronic kidney disease, stage 4 (severe): Secondary | ICD-10-CM | POA: Diagnosis not present

## 2019-08-26 LAB — POCT HEMOGLOBIN-HEMACUE: Hemoglobin: 10.2 g/dL — ABNORMAL LOW (ref 12.0–15.0)

## 2019-08-26 MED ORDER — EPOETIN ALFA 10000 UNIT/ML IJ SOLN: 20000.0000 [IU] | INTRAMUSCULAR | Status: DC

## 2019-08-26 MED ORDER — EPOETIN ALFA 20000 UNIT/ML IJ SOLN
INTRAMUSCULAR | Status: AC
  Administered 2019-08-26: 20000 [IU] via SUBCUTANEOUS
  Filled 2019-08-26: qty 1

## 2019-08-27 ENCOUNTER — Telehealth: Payer: Self-pay | Admitting: *Deleted

## 2019-08-27 NOTE — Telephone Encounter (Signed)
Called and spoke with patient's daughter to arrange a Palliative care home visit. Visit scheduled for tomorrow, 08/28/19 at 11a.

## 2019-08-28 ENCOUNTER — Other Ambulatory Visit: Payer: Medicare Other | Admitting: *Deleted

## 2019-08-28 ENCOUNTER — Ambulatory Visit (INDEPENDENT_AMBULATORY_CARE_PROVIDER_SITE_OTHER): Payer: Medicare Other

## 2019-08-28 ENCOUNTER — Other Ambulatory Visit: Payer: Self-pay

## 2019-08-28 DIAGNOSIS — I4891 Unspecified atrial fibrillation: Secondary | ICD-10-CM

## 2019-08-28 DIAGNOSIS — Z5181 Encounter for therapeutic drug level monitoring: Secondary | ICD-10-CM

## 2019-08-28 DIAGNOSIS — Z515 Encounter for palliative care: Secondary | ICD-10-CM

## 2019-08-28 LAB — POCT INR: INR: 1.9 — AB (ref 2.0–3.0)

## 2019-08-28 NOTE — Patient Instructions (Signed)
Description   Spoke with [name]-pt's dtr & instructed for pt to take 3 tablets today, then resume same dosage 2 tablets daily except 1 tablet on Fridays. Recheck INR in 2 weeks. Call if placed on any new medications [phone].

## 2019-09-04 DIAGNOSIS — C44622 Squamous cell carcinoma of skin of right upper limb, including shoulder: Secondary | ICD-10-CM | POA: Diagnosis not present

## 2019-09-04 DIAGNOSIS — D0461 Carcinoma in situ of skin of right upper limb, including shoulder: Secondary | ICD-10-CM | POA: Diagnosis not present

## 2019-09-04 DIAGNOSIS — D485 Neoplasm of uncertain behavior of skin: Secondary | ICD-10-CM | POA: Diagnosis not present

## 2019-09-04 NOTE — Progress Notes (Signed)
COMMUNITY PALLIATIVE CARE RN NOTE  PATIENT NAME: Madeline [name] DOB: 1925/08/05 MRN: [id number]  PRIMARY CARE PROVIDER: Corine Shelter, PA-C  RESPONSIBLE PARTY: Randell Patient (daughter) Acct ID - Guarantor Home Phone Work Phone Relationship Acct Type  [id number] AVORY, RAHIMI* [phone]  Self P/F     [address], [place], [place] [address]   Covid-19 Pre-screening Negative  PLAN OF CARE and INTERVENTION:  1. ADVANCE CARE PLANNING/GOALS OF CARE: Goal is for patient to remain in her home and avoid hospitalizations. She has a DNR. 2. PATIENT/CAREGIVER EDUCATION: Symptom management, pain management, safe mobility/transfers 3. DISEASE STATUS: Met with patient and her daughter, Madeline [name]. Upon arrival, patient is sitting up in her recliner awake and alert. She is pleasant and engaging. She continues with pain in her knees, especially during ambulation. She wears a left knee brace during ambulation to help with stabilization. She had a recent gout flare up in her right elbow and was placed on Prednisone x 3 days. This has completely resolved. She requires 1 person assistance with bathing, dressing, ambulation and transfers. She is able to feed herself. It is becoming more difficult for her to turn in bed at times. She walks into the kitchen every morning. She spends most of her day sitting up in her recliner. She does experience some dyspnea with exertion, but does recover at rest within a minute. She wears oxygen during the night while she sleeps, and when she takes naps during the day. She has been sleeping better for the past 2 weeks at night since she now has the [facility] system. She no longer has to get up several times per night to urinate. Her daughter is also getting better rest at night. She continues to weigh daily. She continues on Lasix and Metolazone for her CHF. No edema noted in her legs today. They do begin to swell when she keeps her legs in a dependent position for an extended period of time.  She still has times where her heart drops low. About 2 weeks, it dropped to the 30s while she was asleep. Madeline [name] states that when this happens, she wakes her up and gets her moving. She is eating well, 3 meals/day. She drinks a Protein supplement daily. Her blood sugars do fluctuate. In the morning, it ranges between 80s-110s and between 160s-170s in the evenings. Her daughter checks her INR every 2 weeks. She has a growth on her left hand that is getting larger. It is getting hung on covers and clothes that causes it to bleed at the base at times. I recommended that patient go to a Dermatologist to get it removed, since she has a history of skin cancer and had to have areas removed in the past. Patient does not want to go at this time, but family will continue to encourage. She continues to receive monthly injections for her Hgb. Last Hgb 10.2. Next injection is on June 1st. Will continue to monitor.   HISTORY OF PRESENT ILLNESS: This is a 86 yo female with a history of CHF, CKD Stage IV, DM, GERD, hyperlipidemia, Afib and HTN. She resides in the home with her daughter Palliative care team continues to follow patient and visits monthly and PRN.   CODE STATUS: DNR ADVANCED DIRECTIVES: Y MOST FORM: yes PPS: 40%   PHYSICAL EXAM:   VITALS: Today's Vitals   08/28/19 1135  BP: (!) 169/71  Pulse: 62  Resp: 20  Temp: 97.8 F (36.6 C)  TempSrc: Temporal  SpO2: 96%  Weight: 156  lb (70.8 kg)  Height: 5' 2" (1.575 m)  PainSc: 2   PainLoc: Knee    LUNGS: clear to auscultation  CARDIAC: Cor RRR EXTREMITIES: No edema SKIN: Thin/frail skin: growth on her right hand is becoming larger  NEURO: Alert and oriented x 3, generalized weakness, ambulatory w/walker and 1 person assistance    (Duration of visit and documentation 75 minutes)   Daryl Eastern, RN BSN

## 2019-09-11 ENCOUNTER — Ambulatory Visit (INDEPENDENT_AMBULATORY_CARE_PROVIDER_SITE_OTHER): Payer: Medicare Other

## 2019-09-11 DIAGNOSIS — I4891 Unspecified atrial fibrillation: Secondary | ICD-10-CM | POA: Diagnosis not present

## 2019-09-11 DIAGNOSIS — Z5181 Encounter for therapeutic drug level monitoring: Secondary | ICD-10-CM

## 2019-09-11 LAB — POCT INR: INR: 2.1 (ref 2.0–3.0)

## 2019-09-11 NOTE — Patient Instructions (Signed)
Description   Spoke with Madeline [name]-pt's dtr & instructed for pt to continue on same dosage 2 tablets daily except 1 tablet on Fridays. Recheck INR in 2 weeks. Call if placed on any new medications [phone].

## 2019-09-23 ENCOUNTER — Encounter (HOSPITAL_COMMUNITY): Payer: Medicare Other

## 2019-09-24 ENCOUNTER — Telehealth: Payer: Self-pay | Admitting: *Deleted

## 2019-09-24 NOTE — Telephone Encounter (Signed)
Called and left a voicemail for patient's daughter, [name], to schedule a Palliative care home visit. Patient is staying with her this week since Randell Patient will be out of town. Provided contact information for return call.

## 2019-09-24 NOTE — Telephone Encounter (Signed)
Received a call back from patient's daughter, [name]. Palliative care visit scheduled for 5/19@11a .

## 2019-09-25 ENCOUNTER — Other Ambulatory Visit: Payer: Self-pay

## 2019-09-25 ENCOUNTER — Other Ambulatory Visit: Payer: Medicare Other | Admitting: *Deleted

## 2019-09-25 ENCOUNTER — Ambulatory Visit (INDEPENDENT_AMBULATORY_CARE_PROVIDER_SITE_OTHER): Payer: Medicare Other

## 2019-09-25 DIAGNOSIS — I4891 Unspecified atrial fibrillation: Secondary | ICD-10-CM | POA: Diagnosis not present

## 2019-09-25 DIAGNOSIS — Z515 Encounter for palliative care: Secondary | ICD-10-CM

## 2019-09-25 DIAGNOSIS — Z5181 Encounter for therapeutic drug level monitoring: Secondary | ICD-10-CM

## 2019-09-25 DIAGNOSIS — Z7901 Long term (current) use of anticoagulants: Secondary | ICD-10-CM | POA: Diagnosis not present

## 2019-09-25 LAB — POCT INR: INR: 1.5 — AB (ref 2.0–3.0)

## 2019-09-25 NOTE — Patient Instructions (Signed)
Description   Spoke with [name]-pt's dtr & instructed for pt to continue on same dosage 2 tablets daily except 1 tablet on Fridays. Recheck INR in 2 weeks. Call if placed on any new medications [phone].

## 2019-09-26 ENCOUNTER — Encounter: Payer: Self-pay | Admitting: Interventional Cardiology

## 2019-09-26 NOTE — Progress Notes (Signed)
This encounter was created in error - please disregard.

## 2019-09-26 NOTE — Patient Instructions (Signed)
Description   Spoke with [name]-pt's dtr & instructed for pt to continue on same dosage 2 tablets daily except 1 tablet on Fridays. Recheck INR in 2 weeks. Call if placed on any new medications [phone].

## 2019-09-27 ENCOUNTER — Encounter (HOSPITAL_BASED_OUTPATIENT_CLINIC_OR_DEPARTMENT_OTHER): Payer: Self-pay | Admitting: *Deleted

## 2019-09-27 ENCOUNTER — Emergency Department (HOSPITAL_BASED_OUTPATIENT_CLINIC_OR_DEPARTMENT_OTHER)
Admission: EM | Admit: 2019-09-27 | Discharge: 2019-09-27 | Disposition: A | Discharge status: Home / Self Care | Payer: Medicare Other | Attending: Emergency Medicine | Admitting: Emergency Medicine

## 2019-09-27 ENCOUNTER — Other Ambulatory Visit: Payer: Self-pay

## 2019-09-27 ENCOUNTER — Emergency Department (HOSPITAL_BASED_OUTPATIENT_CLINIC_OR_DEPARTMENT_OTHER): Payer: Medicare Other

## 2019-09-27 DIAGNOSIS — I5042 Chronic combined systolic (congestive) and diastolic (congestive) heart failure: Secondary | ICD-10-CM | POA: Diagnosis not present

## 2019-09-27 DIAGNOSIS — M25551 Pain in right hip: Secondary | ICD-10-CM | POA: Diagnosis not present

## 2019-09-27 DIAGNOSIS — E1122 Type 2 diabetes mellitus with diabetic chronic kidney disease: Secondary | ICD-10-CM | POA: Diagnosis not present

## 2019-09-27 DIAGNOSIS — M1611 Unilateral primary osteoarthritis, right hip: Secondary | ICD-10-CM | POA: Diagnosis not present

## 2019-09-27 DIAGNOSIS — Z79899 Other long term (current) drug therapy: Secondary | ICD-10-CM | POA: Insufficient documentation

## 2019-09-27 DIAGNOSIS — Z794 Long term (current) use of insulin: Secondary | ICD-10-CM | POA: Insufficient documentation

## 2019-09-27 DIAGNOSIS — Z7901 Long term (current) use of anticoagulants: Secondary | ICD-10-CM | POA: Diagnosis not present

## 2019-09-27 DIAGNOSIS — N184 Chronic kidney disease, stage 4 (severe): Secondary | ICD-10-CM | POA: Diagnosis not present

## 2019-09-27 DIAGNOSIS — R102 Pelvic and perineal pain: Secondary | ICD-10-CM | POA: Diagnosis present

## 2019-09-27 DIAGNOSIS — E039 Hypothyroidism, unspecified: Secondary | ICD-10-CM | POA: Insufficient documentation

## 2019-09-27 DIAGNOSIS — I130 Hypertensive heart and chronic kidney disease with heart failure and stage 1 through stage 4 chronic kidney disease, or unspecified chronic kidney disease: Secondary | ICD-10-CM | POA: Diagnosis not present

## 2019-09-27 DIAGNOSIS — M25559 Pain in unspecified hip: Secondary | ICD-10-CM

## 2019-09-27 DIAGNOSIS — Z87891 Personal history of nicotine dependence: Secondary | ICD-10-CM | POA: Diagnosis not present

## 2019-09-27 LAB — URINALYSIS, ROUTINE W REFLEX MICROSCOPIC
Bilirubin Urine: NEGATIVE
Glucose, UA: NEGATIVE mg/dL
Ketones, ur: NEGATIVE mg/dL
Leukocytes,Ua: NEGATIVE
Nitrite: NEGATIVE
Protein, ur: 30 mg/dL — AB
Specific Gravity, Urine: 1.01 (ref 1.005–1.030)
pH: 6 (ref 5.0–8.0)

## 2019-09-27 LAB — URINALYSIS, MICROSCOPIC (REFLEX): WBC, UA: NONE SEEN WBC/hpf (ref 0–5)

## 2019-09-27 MED ORDER — HYDROCODONE-ACETAMINOPHEN 5-325 MG PO TABS: 0.5000 | ORAL_TABLET | Freq: Four times a day (QID) | ORAL | 0 refills | Status: AC | PRN
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
## 2019-09-27 NOTE — ED Triage Notes (Signed)
Pt c/o lower back pain and lower pelvic pain ? UTI

## 2019-09-27 NOTE — Discharge Instructions (Signed)
Follow-up with your doctor and with palliative care for further adjustment of the pain medicines.  Urine culture is also been sent.

## 2019-09-29 LAB — URINE CULTURE: Culture: NO GROWTH

## 2019-09-30 NOTE — Progress Notes (Signed)
COMMUNITY PALLIATIVE CARE RN NOTE  [name] NAME: Madeline [name] DOB: 06-Apr-1926 MRN: [id number]  PRIMARY CARE PROVIDER: Corine Shelter, PA-C  RESPONSIBLE PARTY: Madeline [name] (daughter) Acct ID - Guarantor Home Phone Work Phone Relationship Acct Type  [id number] Madeline [name]* [phone]  Self P/F     [address], [place], [place] [address]   Covid-19 Pre-screening Negative  PLAN OF CARE and INTERVENTION:  1. ADVANCE CARE PLANNING/GOALS OF CARE: Goal is for [name] to remain in the home with her daughter. She has a DNR. 2. [name]/CAREGIVER EDUCATION: Symptom management, pain management, s/s of infection, skin breakdown prevention 3. DISEASE STATUS: [name] is currently staying with her daughter, Madeline [name], this week since her other daughter is out of town this week. Upon arrival, [name] is sitting up in her recliner awake and alert. She remains alert and oriented and able to make her needs known. She has recently been experiencing severe pain in her right hip, that radiates down her right leg. Tylenol and Tramadol has not been very effective in managing her pain. Her daughter gave her a dose of Prednisone, which seemed to help. She is only reporting mild pain today, but is tolerable. On Saturday, she was able to walk back and forth to the bathroom, but now is not ambulating with her walker much. She is mainly just standing and pivoting to a bedside commode due to pain with movement. She requires 1 person assistance with all ADLs, except eating. Pain also occurs when she is sitting/lying down at times. No dyspnea noted during visit. She remains on Lasix and Metolazone for her CHF. [name] has oxygen, but only wears when she sleeps. Her appetite remains good. No swallowing difficulties. Her blood sugar this am was 107. Checked [name]'s INR and it is 1.5 today. Her daughter contacted the Coumadin clinic and her medication doses were adjusted. Next INR check in 1 week. She has a slightly darkened area on  her right buttocks. May possible be due to pressure. No open skin noticed. Reinforced changing positions often to help prevent skin breakdown. She did recently have the growth removed on her right hand. Daughter states that the growth was another skin cancer. Area is healing well. She will return back home with her daughter Madeline [name] on Saturday. Will continue to monitor.   HISTORY OF PRESENT ILLNESS: This is a 86 yo female with a history of CHF, CKD Stage IV, DM, GERD, hyperlipidemia, Afib and HTN. She resides in the home with her daughter Palliative care team continues to follow [name] and visits monthly and PRN.    CODE STATUS: DNR ADVANCED DIRECTIVES: Y MOST FORM: yes PPS: 40%   PHYSICAL EXAM:   VITALS: Today's Vitals   09/25/19 1146  BP: (!) 162/72  Pulse: (!) 54  Resp: 18  Temp: (!) 97.4 F (36.3 C)  TempSrc: Temporal  SpO2: 98%  PainSc: 2   PainLoc: Hip    LUNGS: clear to auscultation  CARDIAC: Cor Loletha Grayer EXTREMITIES: No edema noted  SKIN: Scattered bruising to both arms, thin/frail skin, slight darkening of right buttocks  NEURO: Alert and oriented x 3, pleasant mood, generalized weakness, ambulatory w/walker short distances   (Duration of visit and documentation 60 minutes)   Madeline Eastern, RN BS

## 2019-10-02 ENCOUNTER — Ambulatory Visit (INDEPENDENT_AMBULATORY_CARE_PROVIDER_SITE_OTHER): Payer: Medicare Other | Admitting: *Deleted

## 2019-10-02 DIAGNOSIS — Z5181 Encounter for therapeutic drug level monitoring: Secondary | ICD-10-CM | POA: Diagnosis not present

## 2019-10-02 DIAGNOSIS — I4891 Unspecified atrial fibrillation: Secondary | ICD-10-CM | POA: Diagnosis not present

## 2019-10-02 LAB — POCT INR: INR: 2.7 (ref 2.0–3.0)

## 2019-10-08 ENCOUNTER — Other Ambulatory Visit: Payer: Self-pay

## 2019-10-08 ENCOUNTER — Encounter (HOSPITAL_COMMUNITY)
Admission: RE | Admit: 2019-10-08 | Discharge: 2019-10-08 | Disposition: A | Discharge status: Home / Self Care | Payer: Medicare Other | Source: Ambulatory Visit | Attending: Nephrology | Admitting: Nephrology

## 2019-10-08 VITALS — BP 148/48 | HR 53 | Temp 96.7°F | Resp 20

## 2019-10-08 DIAGNOSIS — N184 Chronic kidney disease, stage 4 (severe): Secondary | ICD-10-CM | POA: Diagnosis present

## 2019-10-08 LAB — IRON AND TIBC
Iron: 33 ug/dL (ref 28–170)
Saturation Ratios: 18 % (ref 10.4–31.8)
TIBC: 188 ug/dL — ABNORMAL LOW (ref 250–450)
UIBC: 155 ug/dL

## 2019-10-08 LAB — FERRITIN: Ferritin: 586 ng/mL — ABNORMAL HIGH (ref 11–307)

## 2019-10-08 LAB — POCT HEMOGLOBIN-HEMACUE: Hemoglobin: 10.8 g/dL — ABNORMAL LOW (ref 12.0–15.0)

## 2019-10-08 MED ORDER — EPOETIN ALFA 20000 UNIT/ML IJ SOLN
20000.0000 [IU] | INTRAMUSCULAR | Status: DC
  Administered 2019-10-08: 20000 [IU] via SUBCUTANEOUS

## 2019-10-08 MED ORDER — EPOETIN ALFA 20000 UNIT/ML IJ SOLN
INTRAMUSCULAR | Status: AC
  Filled 2019-10-08: qty 1

## 2019-10-14 ENCOUNTER — Other Ambulatory Visit: Payer: Self-pay | Admitting: Cardiology

## 2019-10-16 ENCOUNTER — Ambulatory Visit (INDEPENDENT_AMBULATORY_CARE_PROVIDER_SITE_OTHER): Payer: Medicare Other | Admitting: *Deleted

## 2019-10-16 DIAGNOSIS — Z5181 Encounter for therapeutic drug level monitoring: Secondary | ICD-10-CM

## 2019-10-16 DIAGNOSIS — I4891 Unspecified atrial fibrillation: Secondary | ICD-10-CM

## 2019-10-16 LAB — POCT INR: INR: 3.2 — AB (ref 2.0–3.0)

## 2019-10-16 NOTE — Patient Instructions (Signed)
Description   Spoke with Madeline [name]-pt's dtr & instructed for pt to take 1 tablet today then continue taking 2 tablets daily. Recheck INR in 2 weeks. Call if placed on any new medications [phone].

## 2019-10-17 ENCOUNTER — Telehealth: Payer: Self-pay | Admitting: *Deleted

## 2019-10-17 NOTE — Telephone Encounter (Signed)
Communication sent to patient's daughter, Randell Patient, to arrange a Palliative care home visit. Visit scheduled for 6/11@10 :30a.

## 2019-10-18 ENCOUNTER — Other Ambulatory Visit: Payer: Medicare Other | Admitting: *Deleted

## 2019-10-18 ENCOUNTER — Other Ambulatory Visit: Payer: Self-pay

## 2019-10-18 VITALS — BP 117/68 | HR 78 | Temp 97.7°F | Resp 19

## 2019-10-18 DIAGNOSIS — Z515 Encounter for palliative care: Secondary | ICD-10-CM

## 2019-10-21 ENCOUNTER — Encounter: Payer: Self-pay | Admitting: *Deleted

## 2019-10-21 NOTE — Progress Notes (Signed)
COMMUNITY PALLIATIVE CARE RN NOTE  [name] NAME: Madeline [name] DOB: 08-27-1925 MRN: [id number]  PRIMARY CARE PROVIDER: Corine Shelter, PA-C  RESPONSIBLE PARTY: Madeline [name] (daughter) Acct ID - Guarantor Home Phone Work Phone Relationship Acct Type  [id number] LIBERTI, [place]* [phone]  Self P/F     [address], [place], [place] [address]   Covid-19 Pre-screening Negative  PLAN OF CARE and INTERVENTION:  1. ADVANCE CARE PLANNING/GOALS OF CARE: Goal is for [name] to remain in the home with her daughter. She has a DNR. 2. [name]/CAREGIVER EDUCATION: Safe Mobility/transfers, pain management, skin breakdown management 3. DISEASE STATUS: Met with [name] and daughter's Madeline [name] and Madeline [name] in the home. Upon arrival, she is sitting up in her recliner awake and alert. She is alert and oriented x 3 and able to engage in appropriate conversation. She denies pain at this time while she is at rest, but has been experiencing increased frequency and intensity of pain over the past few weeks in her knees, legs and hips. Prednisone helps but daughter's will not give her this more than 3 day in a row d/t her being diabetic. She was seen in the ED on 09/27/19 for increasing right hip pain and suspicion of a UTI. X-ray of her right hip showed advanced right hip osteoarthritis. Urine specimen was obtained and cultures sent and to be followed up on as outpatient. She was prescribed a limited quantity of Vicodin 5/325 mg. She was given 1/2 Vicodin tablet 2 nights ago because she was unable to get comfortable in bed. It was effective. She is having more difficulties with ambulation and transfers d/t hip pain and chronic left knee pain. Her legs are very stiff and she is unable to bend them. One day this week, her daughter was unable to get her up from the recliner. She is having more difficulty repositioning in bed. She uses a trapeze bar to help. She was able to ambulate to the kitchen for breakfast one day this week,  but she is not able to perform this task on a regular basis. They utilize a bedside commode during the day and [facility] Urinary system at bedtime, which helps her sleep better during the night. She has an area on her left buttocks that is beginning to darken d/t pressure, but area is not open. Her appetite remains good and she drinks Ensure for nutritional supplementation. She did have some coughing/choking episodes on watermelon and cantaloupe recently. Her blood sugar this morning was 136 and is currently 275 after eating cereal w/bananas. She is given Toujeo 12 Units every other night. She continues with anemia therapy monthly at [facility] d/t her chroking kidney disease. INRs are checked in home every 2 weeks and is currently elevated per daughter at 3.0. Will continue to monitor.  HISTORY OF PRESENT ILLNESS: This is a 86 yo female with a history of CHF, CKD Stage IV, DM, GERD, hyperlipidemia, Afib and HTN.She resides in the home with her daughter Palliative care team continues to follow [name] andvisitsmonthly and PRN.   CODE STATUS: DNR ADVANCED DIRECTIVES: Y MOST FORM: yes PPS: 40%   PHYSICAL EXAM:   VITALS: Today's Vitals   10/18/19 1059  BP: 117/68  Pulse: 78  Resp: 19  Temp: 97.7 F (36.5 C)  TempSrc: Temporal  SpO2: 98%  PainSc: 0-No pain    LUNGS: clear to auscultation  CARDIAC: Cor RRR EXTREMITIES: No edema SKIN: thin/frail skin and bruises easily  NEURO: Alert and oriented x 3, pleasant mood, increased generalized weakness,  ambulation abilities vary    (Duration of visit and documentation   Madeline Eastern, RN BSN

## 2019-10-26 DIAGNOSIS — K219 Gastro-esophageal reflux disease without esophagitis: Secondary | ICD-10-CM | POA: Diagnosis not present

## 2019-10-26 DIAGNOSIS — I5032 Chronic diastolic (congestive) heart failure: Secondary | ICD-10-CM | POA: Diagnosis not present

## 2019-10-26 DIAGNOSIS — Z794 Long term (current) use of insulin: Secondary | ICD-10-CM | POA: Diagnosis not present

## 2019-10-26 DIAGNOSIS — E785 Hyperlipidemia, unspecified: Secondary | ICD-10-CM | POA: Diagnosis not present

## 2019-10-26 DIAGNOSIS — Z7901 Long term (current) use of anticoagulants: Secondary | ICD-10-CM | POA: Diagnosis not present

## 2019-10-26 DIAGNOSIS — I482 Chronic atrial fibrillation, unspecified: Secondary | ICD-10-CM | POA: Diagnosis not present

## 2019-10-26 DIAGNOSIS — Z79891 Long term (current) use of opiate analgesic: Secondary | ICD-10-CM | POA: Diagnosis not present

## 2019-10-26 DIAGNOSIS — I110 Hypertensive heart disease with heart failure: Secondary | ICD-10-CM | POA: Diagnosis not present

## 2019-10-26 DIAGNOSIS — M1612 Unilateral primary osteoarthritis, left hip: Secondary | ICD-10-CM | POA: Diagnosis not present

## 2019-10-26 DIAGNOSIS — I2510 Atherosclerotic heart disease of native coronary artery without angina pectoris: Secondary | ICD-10-CM | POA: Diagnosis not present

## 2019-10-26 DIAGNOSIS — E1122 Type 2 diabetes mellitus with diabetic chronic kidney disease: Secondary | ICD-10-CM | POA: Diagnosis not present

## 2019-10-26 DIAGNOSIS — I081 Rheumatic disorders of both mitral and tricuspid valves: Secondary | ICD-10-CM | POA: Diagnosis not present

## 2019-10-26 DIAGNOSIS — Z9981 Dependence on supplemental oxygen: Secondary | ICD-10-CM | POA: Diagnosis not present

## 2019-10-26 DIAGNOSIS — I0981 Rheumatic heart failure: Secondary | ICD-10-CM | POA: Diagnosis not present

## 2019-10-26 DIAGNOSIS — E039 Hypothyroidism, unspecified: Secondary | ICD-10-CM | POA: Diagnosis not present

## 2019-10-26 DIAGNOSIS — E1142 Type 2 diabetes mellitus with diabetic polyneuropathy: Secondary | ICD-10-CM | POA: Diagnosis not present

## 2019-10-26 DIAGNOSIS — Z87891 Personal history of nicotine dependence: Secondary | ICD-10-CM | POA: Diagnosis not present

## 2019-10-26 DIAGNOSIS — N184 Chronic kidney disease, stage 4 (severe): Secondary | ICD-10-CM | POA: Diagnosis not present

## 2019-10-28 DIAGNOSIS — I2510 Atherosclerotic heart disease of native coronary artery without angina pectoris: Secondary | ICD-10-CM | POA: Diagnosis not present

## 2019-10-28 DIAGNOSIS — I0981 Rheumatic heart failure: Secondary | ICD-10-CM | POA: Diagnosis not present

## 2019-10-28 DIAGNOSIS — I110 Hypertensive heart disease with heart failure: Secondary | ICD-10-CM | POA: Diagnosis not present

## 2019-10-28 DIAGNOSIS — I5032 Chronic diastolic (congestive) heart failure: Secondary | ICD-10-CM | POA: Diagnosis not present

## 2019-10-28 DIAGNOSIS — E1122 Type 2 diabetes mellitus with diabetic chronic kidney disease: Secondary | ICD-10-CM | POA: Diagnosis not present

## 2019-10-28 DIAGNOSIS — N184 Chronic kidney disease, stage 4 (severe): Secondary | ICD-10-CM | POA: Diagnosis not present

## 2019-10-30 DIAGNOSIS — N184 Chronic kidney disease, stage 4 (severe): Secondary | ICD-10-CM | POA: Diagnosis not present

## 2019-10-30 DIAGNOSIS — I2510 Atherosclerotic heart disease of native coronary artery without angina pectoris: Secondary | ICD-10-CM | POA: Diagnosis not present

## 2019-10-30 DIAGNOSIS — I5032 Chronic diastolic (congestive) heart failure: Secondary | ICD-10-CM | POA: Diagnosis not present

## 2019-10-30 DIAGNOSIS — E1122 Type 2 diabetes mellitus with diabetic chronic kidney disease: Secondary | ICD-10-CM | POA: Diagnosis not present

## 2019-10-30 DIAGNOSIS — I110 Hypertensive heart disease with heart failure: Secondary | ICD-10-CM | POA: Diagnosis not present

## 2019-10-30 DIAGNOSIS — I0981 Rheumatic heart failure: Secondary | ICD-10-CM | POA: Diagnosis not present

## 2019-10-31 ENCOUNTER — Ambulatory Visit (INDEPENDENT_AMBULATORY_CARE_PROVIDER_SITE_OTHER): Payer: Medicare Other | Admitting: *Deleted

## 2019-10-31 DIAGNOSIS — N184 Chronic kidney disease, stage 4 (severe): Secondary | ICD-10-CM | POA: Diagnosis not present

## 2019-10-31 DIAGNOSIS — I2510 Atherosclerotic heart disease of native coronary artery without angina pectoris: Secondary | ICD-10-CM | POA: Diagnosis not present

## 2019-10-31 DIAGNOSIS — I4891 Unspecified atrial fibrillation: Secondary | ICD-10-CM

## 2019-10-31 DIAGNOSIS — I0981 Rheumatic heart failure: Secondary | ICD-10-CM | POA: Diagnosis not present

## 2019-10-31 DIAGNOSIS — Z5181 Encounter for therapeutic drug level monitoring: Secondary | ICD-10-CM

## 2019-10-31 DIAGNOSIS — I110 Hypertensive heart disease with heart failure: Secondary | ICD-10-CM | POA: Diagnosis not present

## 2019-10-31 DIAGNOSIS — E1122 Type 2 diabetes mellitus with diabetic chronic kidney disease: Secondary | ICD-10-CM | POA: Diagnosis not present

## 2019-10-31 DIAGNOSIS — I5032 Chronic diastolic (congestive) heart failure: Secondary | ICD-10-CM | POA: Diagnosis not present

## 2019-10-31 LAB — POCT INR: INR: 2.5 (ref 2.0–3.0)

## 2019-10-31 NOTE — Patient Instructions (Signed)
Description   Spoke with Madeline [name]-pt's dtr & instructed for pt to to continue taking Warfarin 2 tablets daily. Recheck INR in 2 weeks. Call if placed on any new medications [phone].

## 2019-11-04 DIAGNOSIS — I110 Hypertensive heart disease with heart failure: Secondary | ICD-10-CM | POA: Diagnosis not present

## 2019-11-04 DIAGNOSIS — N184 Chronic kidney disease, stage 4 (severe): Secondary | ICD-10-CM | POA: Diagnosis not present

## 2019-11-04 DIAGNOSIS — E1122 Type 2 diabetes mellitus with diabetic chronic kidney disease: Secondary | ICD-10-CM | POA: Diagnosis not present

## 2019-11-04 DIAGNOSIS — I5032 Chronic diastolic (congestive) heart failure: Secondary | ICD-10-CM | POA: Diagnosis not present

## 2019-11-04 DIAGNOSIS — I0981 Rheumatic heart failure: Secondary | ICD-10-CM | POA: Diagnosis not present

## 2019-11-04 DIAGNOSIS — I2510 Atherosclerotic heart disease of native coronary artery without angina pectoris: Secondary | ICD-10-CM | POA: Diagnosis not present

## 2019-11-05 DIAGNOSIS — N184 Chronic kidney disease, stage 4 (severe): Secondary | ICD-10-CM | POA: Diagnosis not present

## 2019-11-05 DIAGNOSIS — I0981 Rheumatic heart failure: Secondary | ICD-10-CM | POA: Diagnosis not present

## 2019-11-05 DIAGNOSIS — I2510 Atherosclerotic heart disease of native coronary artery without angina pectoris: Secondary | ICD-10-CM | POA: Diagnosis not present

## 2019-11-05 DIAGNOSIS — E1122 Type 2 diabetes mellitus with diabetic chronic kidney disease: Secondary | ICD-10-CM | POA: Diagnosis not present

## 2019-11-05 DIAGNOSIS — I110 Hypertensive heart disease with heart failure: Secondary | ICD-10-CM | POA: Diagnosis not present

## 2019-11-05 DIAGNOSIS — I5032 Chronic diastolic (congestive) heart failure: Secondary | ICD-10-CM | POA: Diagnosis not present

## 2019-11-06 DIAGNOSIS — E1122 Type 2 diabetes mellitus with diabetic chronic kidney disease: Secondary | ICD-10-CM | POA: Diagnosis not present

## 2019-11-06 DIAGNOSIS — I110 Hypertensive heart disease with heart failure: Secondary | ICD-10-CM | POA: Diagnosis not present

## 2019-11-06 DIAGNOSIS — I0981 Rheumatic heart failure: Secondary | ICD-10-CM | POA: Diagnosis not present

## 2019-11-06 DIAGNOSIS — N184 Chronic kidney disease, stage 4 (severe): Secondary | ICD-10-CM | POA: Diagnosis not present

## 2019-11-06 DIAGNOSIS — I2510 Atherosclerotic heart disease of native coronary artery without angina pectoris: Secondary | ICD-10-CM | POA: Diagnosis not present

## 2019-11-06 DIAGNOSIS — I5032 Chronic diastolic (congestive) heart failure: Secondary | ICD-10-CM | POA: Diagnosis not present

## 2019-11-07 DIAGNOSIS — R609 Edema, unspecified: Secondary | ICD-10-CM | POA: Diagnosis not present

## 2019-11-07 DIAGNOSIS — I482 Chronic atrial fibrillation, unspecified: Secondary | ICD-10-CM | POA: Diagnosis not present

## 2019-11-07 DIAGNOSIS — I2510 Atherosclerotic heart disease of native coronary artery without angina pectoris: Secondary | ICD-10-CM | POA: Diagnosis not present

## 2019-11-07 DIAGNOSIS — E1122 Type 2 diabetes mellitus with diabetic chronic kidney disease: Secondary | ICD-10-CM | POA: Diagnosis not present

## 2019-11-07 DIAGNOSIS — N184 Chronic kidney disease, stage 4 (severe): Secondary | ICD-10-CM | POA: Diagnosis not present

## 2019-11-07 DIAGNOSIS — I0981 Rheumatic heart failure: Secondary | ICD-10-CM | POA: Diagnosis not present

## 2019-11-07 DIAGNOSIS — I5032 Chronic diastolic (congestive) heart failure: Secondary | ICD-10-CM | POA: Diagnosis not present

## 2019-11-07 DIAGNOSIS — I110 Hypertensive heart disease with heart failure: Secondary | ICD-10-CM | POA: Diagnosis not present

## 2019-11-07 DIAGNOSIS — R0602 Shortness of breath: Secondary | ICD-10-CM | POA: Diagnosis not present

## 2019-11-08 DIAGNOSIS — I5032 Chronic diastolic (congestive) heart failure: Secondary | ICD-10-CM | POA: Diagnosis not present

## 2019-11-08 DIAGNOSIS — I2510 Atherosclerotic heart disease of native coronary artery without angina pectoris: Secondary | ICD-10-CM | POA: Diagnosis not present

## 2019-11-08 DIAGNOSIS — N184 Chronic kidney disease, stage 4 (severe): Secondary | ICD-10-CM | POA: Diagnosis not present

## 2019-11-08 DIAGNOSIS — I0981 Rheumatic heart failure: Secondary | ICD-10-CM | POA: Diagnosis not present

## 2019-11-08 DIAGNOSIS — I110 Hypertensive heart disease with heart failure: Secondary | ICD-10-CM | POA: Diagnosis not present

## 2019-11-08 DIAGNOSIS — E1122 Type 2 diabetes mellitus with diabetic chronic kidney disease: Secondary | ICD-10-CM | POA: Diagnosis not present

## 2019-11-12 DIAGNOSIS — I0981 Rheumatic heart failure: Secondary | ICD-10-CM | POA: Diagnosis not present

## 2019-11-12 DIAGNOSIS — E1122 Type 2 diabetes mellitus with diabetic chronic kidney disease: Secondary | ICD-10-CM | POA: Diagnosis not present

## 2019-11-12 DIAGNOSIS — I2510 Atherosclerotic heart disease of native coronary artery without angina pectoris: Secondary | ICD-10-CM | POA: Diagnosis not present

## 2019-11-12 DIAGNOSIS — I5032 Chronic diastolic (congestive) heart failure: Secondary | ICD-10-CM | POA: Diagnosis not present

## 2019-11-12 DIAGNOSIS — N184 Chronic kidney disease, stage 4 (severe): Secondary | ICD-10-CM | POA: Diagnosis not present

## 2019-11-12 DIAGNOSIS — I110 Hypertensive heart disease with heart failure: Secondary | ICD-10-CM | POA: Diagnosis not present

## 2019-11-13 DIAGNOSIS — E1122 Type 2 diabetes mellitus with diabetic chronic kidney disease: Secondary | ICD-10-CM | POA: Diagnosis not present

## 2019-11-13 DIAGNOSIS — I110 Hypertensive heart disease with heart failure: Secondary | ICD-10-CM | POA: Diagnosis not present

## 2019-11-13 DIAGNOSIS — I5032 Chronic diastolic (congestive) heart failure: Secondary | ICD-10-CM | POA: Diagnosis not present

## 2019-11-13 DIAGNOSIS — N184 Chronic kidney disease, stage 4 (severe): Secondary | ICD-10-CM | POA: Diagnosis not present

## 2019-11-13 DIAGNOSIS — I0981 Rheumatic heart failure: Secondary | ICD-10-CM | POA: Diagnosis not present

## 2019-11-13 DIAGNOSIS — I2510 Atherosclerotic heart disease of native coronary artery without angina pectoris: Secondary | ICD-10-CM | POA: Diagnosis not present

## 2019-11-14 ENCOUNTER — Ambulatory Visit (INDEPENDENT_AMBULATORY_CARE_PROVIDER_SITE_OTHER): Payer: Medicare Other | Admitting: *Deleted

## 2019-11-14 DIAGNOSIS — E1122 Type 2 diabetes mellitus with diabetic chronic kidney disease: Secondary | ICD-10-CM | POA: Diagnosis not present

## 2019-11-14 DIAGNOSIS — I2510 Atherosclerotic heart disease of native coronary artery without angina pectoris: Secondary | ICD-10-CM | POA: Diagnosis not present

## 2019-11-14 DIAGNOSIS — N184 Chronic kidney disease, stage 4 (severe): Secondary | ICD-10-CM | POA: Diagnosis not present

## 2019-11-14 DIAGNOSIS — Z5181 Encounter for therapeutic drug level monitoring: Secondary | ICD-10-CM

## 2019-11-14 DIAGNOSIS — I0981 Rheumatic heart failure: Secondary | ICD-10-CM | POA: Diagnosis not present

## 2019-11-14 DIAGNOSIS — I110 Hypertensive heart disease with heart failure: Secondary | ICD-10-CM | POA: Diagnosis not present

## 2019-11-14 DIAGNOSIS — I4891 Unspecified atrial fibrillation: Secondary | ICD-10-CM

## 2019-11-14 DIAGNOSIS — I5032 Chronic diastolic (congestive) heart failure: Secondary | ICD-10-CM | POA: Diagnosis not present

## 2019-11-14 LAB — POCT INR: INR: 2.9 (ref 2.0–3.0)

## 2019-11-14 NOTE — Patient Instructions (Signed)
Description   Spoke with [name]-pt's dtr & instructed for pt to to continue taking Warfarin 2 tablets daily. Recheck INR in 2 weeks. Call if placed on any new medications [phone].

## 2019-11-15 DIAGNOSIS — I5032 Chronic diastolic (congestive) heart failure: Secondary | ICD-10-CM | POA: Diagnosis not present

## 2019-11-15 DIAGNOSIS — E1122 Type 2 diabetes mellitus with diabetic chronic kidney disease: Secondary | ICD-10-CM | POA: Diagnosis not present

## 2019-11-15 DIAGNOSIS — I0981 Rheumatic heart failure: Secondary | ICD-10-CM | POA: Diagnosis not present

## 2019-11-15 DIAGNOSIS — N184 Chronic kidney disease, stage 4 (severe): Secondary | ICD-10-CM | POA: Diagnosis not present

## 2019-11-15 DIAGNOSIS — I110 Hypertensive heart disease with heart failure: Secondary | ICD-10-CM | POA: Diagnosis not present

## 2019-11-15 DIAGNOSIS — I2510 Atherosclerotic heart disease of native coronary artery without angina pectoris: Secondary | ICD-10-CM | POA: Diagnosis not present

## 2019-11-18 ENCOUNTER — Ambulatory Visit: Payer: Medicare Other | Admitting: Cardiology

## 2019-11-19 ENCOUNTER — Ambulatory Visit (HOSPITAL_COMMUNITY)
Admission: RE | Admit: 2019-11-19 | Discharge: 2019-11-19 | Disposition: A | Discharge status: Home / Self Care | Payer: Medicare Other | Source: Ambulatory Visit | Attending: Nephrology | Admitting: Nephrology

## 2019-11-19 ENCOUNTER — Other Ambulatory Visit: Payer: Self-pay

## 2019-11-19 VITALS — BP 147/47 | HR 48 | Temp 97.6°F | Resp 20

## 2019-11-19 DIAGNOSIS — N184 Chronic kidney disease, stage 4 (severe): Secondary | ICD-10-CM

## 2019-11-19 LAB — IRON AND TIBC
Iron: 38 ug/dL (ref 28–170)
Saturation Ratios: 20 % (ref 10.4–31.8)
TIBC: 188 ug/dL — ABNORMAL LOW (ref 250–450)
UIBC: 150 ug/dL

## 2019-11-19 LAB — POCT HEMOGLOBIN-HEMACUE: Hemoglobin: 10 g/dL — ABNORMAL LOW (ref 12.0–15.0)

## 2019-11-19 LAB — FERRITIN: Ferritin: 669 ng/mL — ABNORMAL HIGH (ref 11–307)

## 2019-11-19 MED ORDER — EPOETIN ALFA 10000 UNIT/ML IJ SOLN
20000.0000 [IU] | INTRAMUSCULAR | Status: DC
  Administered 2019-11-19: 20000 [IU] via SUBCUTANEOUS

## 2019-11-19 MED ORDER — EPOETIN ALFA 20000 UNIT/ML IJ SOLN
INTRAMUSCULAR | Status: AC
  Filled 2019-11-19: qty 1

## 2019-11-20 DIAGNOSIS — E1122 Type 2 diabetes mellitus with diabetic chronic kidney disease: Secondary | ICD-10-CM | POA: Diagnosis not present

## 2019-11-20 DIAGNOSIS — I0981 Rheumatic heart failure: Secondary | ICD-10-CM | POA: Diagnosis not present

## 2019-11-20 DIAGNOSIS — N184 Chronic kidney disease, stage 4 (severe): Secondary | ICD-10-CM | POA: Diagnosis not present

## 2019-11-20 DIAGNOSIS — I2510 Atherosclerotic heart disease of native coronary artery without angina pectoris: Secondary | ICD-10-CM | POA: Diagnosis not present

## 2019-11-20 DIAGNOSIS — I110 Hypertensive heart disease with heart failure: Secondary | ICD-10-CM | POA: Diagnosis not present

## 2019-11-20 DIAGNOSIS — I5032 Chronic diastolic (congestive) heart failure: Secondary | ICD-10-CM | POA: Diagnosis not present

## 2019-11-20 MED FILL — Epoetin Alfa Inj 20000 Unit/ML: INTRAMUSCULAR | Qty: 1 | Status: AC

## 2019-11-21 DIAGNOSIS — I0981 Rheumatic heart failure: Secondary | ICD-10-CM | POA: Diagnosis not present

## 2019-11-21 DIAGNOSIS — I110 Hypertensive heart disease with heart failure: Secondary | ICD-10-CM | POA: Diagnosis not present

## 2019-11-21 DIAGNOSIS — I2510 Atherosclerotic heart disease of native coronary artery without angina pectoris: Secondary | ICD-10-CM | POA: Diagnosis not present

## 2019-11-21 DIAGNOSIS — I5032 Chronic diastolic (congestive) heart failure: Secondary | ICD-10-CM | POA: Diagnosis not present

## 2019-11-21 DIAGNOSIS — E1122 Type 2 diabetes mellitus with diabetic chronic kidney disease: Secondary | ICD-10-CM | POA: Diagnosis not present

## 2019-11-21 DIAGNOSIS — N184 Chronic kidney disease, stage 4 (severe): Secondary | ICD-10-CM | POA: Diagnosis not present

## 2019-11-22 DIAGNOSIS — I110 Hypertensive heart disease with heart failure: Secondary | ICD-10-CM | POA: Diagnosis not present

## 2019-11-22 DIAGNOSIS — E1122 Type 2 diabetes mellitus with diabetic chronic kidney disease: Secondary | ICD-10-CM | POA: Diagnosis not present

## 2019-11-22 DIAGNOSIS — I0981 Rheumatic heart failure: Secondary | ICD-10-CM | POA: Diagnosis not present

## 2019-11-22 DIAGNOSIS — N184 Chronic kidney disease, stage 4 (severe): Secondary | ICD-10-CM | POA: Diagnosis not present

## 2019-11-22 DIAGNOSIS — I2510 Atherosclerotic heart disease of native coronary artery without angina pectoris: Secondary | ICD-10-CM | POA: Diagnosis not present

## 2019-11-22 DIAGNOSIS — I5032 Chronic diastolic (congestive) heart failure: Secondary | ICD-10-CM | POA: Diagnosis not present

## 2019-11-25 DIAGNOSIS — I0981 Rheumatic heart failure: Secondary | ICD-10-CM | POA: Diagnosis not present

## 2019-11-25 DIAGNOSIS — Z794 Long term (current) use of insulin: Secondary | ICD-10-CM | POA: Diagnosis not present

## 2019-11-25 DIAGNOSIS — E039 Hypothyroidism, unspecified: Secondary | ICD-10-CM | POA: Diagnosis not present

## 2019-11-25 DIAGNOSIS — I482 Chronic atrial fibrillation, unspecified: Secondary | ICD-10-CM | POA: Diagnosis not present

## 2019-11-25 DIAGNOSIS — E785 Hyperlipidemia, unspecified: Secondary | ICD-10-CM | POA: Diagnosis not present

## 2019-11-25 DIAGNOSIS — I2510 Atherosclerotic heart disease of native coronary artery without angina pectoris: Secondary | ICD-10-CM | POA: Diagnosis not present

## 2019-11-25 DIAGNOSIS — M1612 Unilateral primary osteoarthritis, left hip: Secondary | ICD-10-CM | POA: Diagnosis not present

## 2019-11-25 DIAGNOSIS — I081 Rheumatic disorders of both mitral and tricuspid valves: Secondary | ICD-10-CM | POA: Diagnosis not present

## 2019-11-25 DIAGNOSIS — I5032 Chronic diastolic (congestive) heart failure: Secondary | ICD-10-CM | POA: Diagnosis not present

## 2019-11-25 DIAGNOSIS — Z7901 Long term (current) use of anticoagulants: Secondary | ICD-10-CM | POA: Diagnosis not present

## 2019-11-25 DIAGNOSIS — E1142 Type 2 diabetes mellitus with diabetic polyneuropathy: Secondary | ICD-10-CM | POA: Diagnosis not present

## 2019-11-25 DIAGNOSIS — N184 Chronic kidney disease, stage 4 (severe): Secondary | ICD-10-CM | POA: Diagnosis not present

## 2019-11-25 DIAGNOSIS — Z79891 Long term (current) use of opiate analgesic: Secondary | ICD-10-CM | POA: Diagnosis not present

## 2019-11-25 DIAGNOSIS — I110 Hypertensive heart disease with heart failure: Secondary | ICD-10-CM | POA: Diagnosis not present

## 2019-11-25 DIAGNOSIS — E1122 Type 2 diabetes mellitus with diabetic chronic kidney disease: Secondary | ICD-10-CM | POA: Diagnosis not present

## 2019-11-25 DIAGNOSIS — Z9981 Dependence on supplemental oxygen: Secondary | ICD-10-CM | POA: Diagnosis not present

## 2019-11-25 DIAGNOSIS — K219 Gastro-esophageal reflux disease without esophagitis: Secondary | ICD-10-CM | POA: Diagnosis not present

## 2019-11-25 DIAGNOSIS — Z87891 Personal history of nicotine dependence: Secondary | ICD-10-CM | POA: Diagnosis not present

## 2019-11-28 ENCOUNTER — Ambulatory Visit (INDEPENDENT_AMBULATORY_CARE_PROVIDER_SITE_OTHER): Payer: Medicare Other | Admitting: *Deleted

## 2019-11-28 DIAGNOSIS — I4891 Unspecified atrial fibrillation: Secondary | ICD-10-CM | POA: Diagnosis not present

## 2019-11-28 DIAGNOSIS — Z5181 Encounter for therapeutic drug level monitoring: Secondary | ICD-10-CM | POA: Diagnosis not present

## 2019-11-28 LAB — POCT INR: INR: 3.7 — AB (ref 2.0–3.0)

## 2019-11-28 NOTE — Patient Instructions (Signed)
Description   Spoke with [name]-pt's dtr & instructed for pt to hold today's dose of Warfarin then continue taking Warfarin 2 tablets daily. Recheck INR in 2 weeks. Call if placed on any new medications [phone].

## 2019-12-06 DIAGNOSIS — I110 Hypertensive heart disease with heart failure: Secondary | ICD-10-CM | POA: Diagnosis not present

## 2019-12-06 DIAGNOSIS — N184 Chronic kidney disease, stage 4 (severe): Secondary | ICD-10-CM | POA: Diagnosis not present

## 2019-12-06 DIAGNOSIS — E1122 Type 2 diabetes mellitus with diabetic chronic kidney disease: Secondary | ICD-10-CM | POA: Diagnosis not present

## 2019-12-06 DIAGNOSIS — I0981 Rheumatic heart failure: Secondary | ICD-10-CM | POA: Diagnosis not present

## 2019-12-06 DIAGNOSIS — I5032 Chronic diastolic (congestive) heart failure: Secondary | ICD-10-CM | POA: Diagnosis not present

## 2019-12-06 DIAGNOSIS — I2510 Atherosclerotic heart disease of native coronary artery without angina pectoris: Secondary | ICD-10-CM | POA: Diagnosis not present

## 2019-12-11 DIAGNOSIS — I2510 Atherosclerotic heart disease of native coronary artery without angina pectoris: Secondary | ICD-10-CM | POA: Diagnosis not present

## 2019-12-11 DIAGNOSIS — I5032 Chronic diastolic (congestive) heart failure: Secondary | ICD-10-CM | POA: Diagnosis not present

## 2019-12-11 DIAGNOSIS — E1122 Type 2 diabetes mellitus with diabetic chronic kidney disease: Secondary | ICD-10-CM | POA: Diagnosis not present

## 2019-12-11 DIAGNOSIS — N184 Chronic kidney disease, stage 4 (severe): Secondary | ICD-10-CM | POA: Diagnosis not present

## 2019-12-11 DIAGNOSIS — I110 Hypertensive heart disease with heart failure: Secondary | ICD-10-CM | POA: Diagnosis not present

## 2019-12-11 DIAGNOSIS — I0981 Rheumatic heart failure: Secondary | ICD-10-CM | POA: Diagnosis not present

## 2019-12-12 ENCOUNTER — Ambulatory Visit (INDEPENDENT_AMBULATORY_CARE_PROVIDER_SITE_OTHER): Payer: Medicare Other | Admitting: Cardiovascular Disease

## 2019-12-12 DIAGNOSIS — Z5181 Encounter for therapeutic drug level monitoring: Secondary | ICD-10-CM | POA: Diagnosis not present

## 2019-12-12 LAB — POCT INR: INR: 4.8 — AB (ref 2.0–3.0)

## 2019-12-12 NOTE — Patient Instructions (Signed)
Description   Spoke with Madeline [name]-pt's dtr & instructed for pt to hold warfarin today and tomorrow then start taking 2 tablets daily except for 1 tablet on Mondays and Fridays. Recheck INR in 2 weeks.  Call if placed on any new medications [phone].

## 2019-12-13 DIAGNOSIS — G8929 Other chronic pain: Secondary | ICD-10-CM | POA: Diagnosis not present

## 2019-12-16 DIAGNOSIS — R609 Edema, unspecified: Secondary | ICD-10-CM | POA: Diagnosis not present

## 2019-12-16 DIAGNOSIS — I482 Chronic atrial fibrillation, unspecified: Secondary | ICD-10-CM | POA: Diagnosis not present

## 2019-12-16 DIAGNOSIS — I5032 Chronic diastolic (congestive) heart failure: Secondary | ICD-10-CM | POA: Diagnosis not present

## 2019-12-16 DIAGNOSIS — R0602 Shortness of breath: Secondary | ICD-10-CM | POA: Diagnosis not present

## 2019-12-16 DIAGNOSIS — N184 Chronic kidney disease, stage 4 (severe): Secondary | ICD-10-CM | POA: Diagnosis not present

## 2019-12-17 DIAGNOSIS — I0981 Rheumatic heart failure: Secondary | ICD-10-CM | POA: Diagnosis not present

## 2019-12-17 DIAGNOSIS — I2510 Atherosclerotic heart disease of native coronary artery without angina pectoris: Secondary | ICD-10-CM | POA: Diagnosis not present

## 2019-12-17 DIAGNOSIS — I110 Hypertensive heart disease with heart failure: Secondary | ICD-10-CM | POA: Diagnosis not present

## 2019-12-17 DIAGNOSIS — E1122 Type 2 diabetes mellitus with diabetic chronic kidney disease: Secondary | ICD-10-CM | POA: Diagnosis not present

## 2019-12-17 DIAGNOSIS — N184 Chronic kidney disease, stage 4 (severe): Secondary | ICD-10-CM | POA: Diagnosis not present

## 2019-12-17 DIAGNOSIS — I5032 Chronic diastolic (congestive) heart failure: Secondary | ICD-10-CM | POA: Diagnosis not present

## 2019-12-18 DIAGNOSIS — R634 Abnormal weight loss: Secondary | ICD-10-CM | POA: Diagnosis not present

## 2019-12-18 DIAGNOSIS — R609 Edema, unspecified: Secondary | ICD-10-CM | POA: Diagnosis not present

## 2019-12-18 DIAGNOSIS — M1612 Unilateral primary osteoarthritis, left hip: Secondary | ICD-10-CM | POA: Diagnosis not present

## 2019-12-18 DIAGNOSIS — I272 Pulmonary hypertension, unspecified: Secondary | ICD-10-CM | POA: Diagnosis not present

## 2019-12-18 DIAGNOSIS — E1122 Type 2 diabetes mellitus with diabetic chronic kidney disease: Secondary | ICD-10-CM | POA: Diagnosis not present

## 2019-12-18 DIAGNOSIS — E1142 Type 2 diabetes mellitus with diabetic polyneuropathy: Secondary | ICD-10-CM | POA: Diagnosis not present

## 2019-12-18 DIAGNOSIS — K219 Gastro-esophageal reflux disease without esophagitis: Secondary | ICD-10-CM | POA: Diagnosis not present

## 2019-12-18 DIAGNOSIS — E039 Hypothyroidism, unspecified: Secondary | ICD-10-CM | POA: Diagnosis not present

## 2019-12-18 DIAGNOSIS — I5032 Chronic diastolic (congestive) heart failure: Secondary | ICD-10-CM | POA: Diagnosis not present

## 2019-12-18 DIAGNOSIS — I2510 Atherosclerotic heart disease of native coronary artery without angina pectoris: Secondary | ICD-10-CM | POA: Diagnosis not present

## 2019-12-18 DIAGNOSIS — N184 Chronic kidney disease, stage 4 (severe): Secondary | ICD-10-CM | POA: Diagnosis not present

## 2019-12-18 DIAGNOSIS — N390 Urinary tract infection, site not specified: Secondary | ICD-10-CM | POA: Diagnosis not present

## 2019-12-18 DIAGNOSIS — I482 Chronic atrial fibrillation, unspecified: Secondary | ICD-10-CM | POA: Diagnosis not present

## 2019-12-18 DIAGNOSIS — I340 Nonrheumatic mitral (valve) insufficiency: Secondary | ICD-10-CM | POA: Diagnosis not present

## 2019-12-19 ENCOUNTER — Ambulatory Visit (INDEPENDENT_AMBULATORY_CARE_PROVIDER_SITE_OTHER): Payer: Medicare Other | Admitting: Pharmacist

## 2019-12-19 DIAGNOSIS — I4891 Unspecified atrial fibrillation: Secondary | ICD-10-CM

## 2019-12-19 DIAGNOSIS — R609 Edema, unspecified: Secondary | ICD-10-CM | POA: Diagnosis not present

## 2019-12-19 DIAGNOSIS — Z5181 Encounter for therapeutic drug level monitoring: Secondary | ICD-10-CM | POA: Diagnosis not present

## 2019-12-19 DIAGNOSIS — I5032 Chronic diastolic (congestive) heart failure: Secondary | ICD-10-CM | POA: Diagnosis not present

## 2019-12-19 DIAGNOSIS — N184 Chronic kidney disease, stage 4 (severe): Secondary | ICD-10-CM | POA: Diagnosis not present

## 2019-12-19 DIAGNOSIS — R634 Abnormal weight loss: Secondary | ICD-10-CM | POA: Diagnosis not present

## 2019-12-19 DIAGNOSIS — N390 Urinary tract infection, site not specified: Secondary | ICD-10-CM | POA: Diagnosis not present

## 2019-12-19 DIAGNOSIS — I482 Chronic atrial fibrillation, unspecified: Secondary | ICD-10-CM | POA: Diagnosis not present

## 2019-12-19 LAB — POCT INR: INR: 2.3 (ref 2.0–3.0)

## 2019-12-26 DIAGNOSIS — I5032 Chronic diastolic (congestive) heart failure: Secondary | ICD-10-CM | POA: Diagnosis not present

## 2019-12-26 DIAGNOSIS — N390 Urinary tract infection, site not specified: Secondary | ICD-10-CM | POA: Diagnosis not present

## 2019-12-26 DIAGNOSIS — R609 Edema, unspecified: Secondary | ICD-10-CM | POA: Diagnosis not present

## 2019-12-26 DIAGNOSIS — R634 Abnormal weight loss: Secondary | ICD-10-CM | POA: Diagnosis not present

## 2019-12-26 DIAGNOSIS — N184 Chronic kidney disease, stage 4 (severe): Secondary | ICD-10-CM | POA: Diagnosis not present

## 2019-12-26 DIAGNOSIS — I482 Chronic atrial fibrillation, unspecified: Secondary | ICD-10-CM | POA: Diagnosis not present

## 2019-12-27 ENCOUNTER — Ambulatory Visit (INDEPENDENT_AMBULATORY_CARE_PROVIDER_SITE_OTHER): Payer: PRIVATE HEALTH INSURANCE | Admitting: Internal Medicine

## 2019-12-27 DIAGNOSIS — Z5181 Encounter for therapeutic drug level monitoring: Secondary | ICD-10-CM | POA: Diagnosis not present

## 2019-12-27 LAB — POCT INR: INR: 8 — AB (ref 2.0–3.0)

## 2019-12-27 MED ORDER — PHYTONADIONE 5 MG PO TABS: 2.5000 mg | ORAL_TABLET | Freq: Once | ORAL | 0 refills | Status: AC

## 2019-12-27 NOTE — Patient Instructions (Signed)
Description   Patient now being follow by hospice, daughter will continue to do self testing. Spoke with [name]-pt's dtr & instructed for pt to take 2.5mg  of vitamin K today. She will not take any warfarin today, tomorrow, or Sunday, and will recheck INR on Monday. Pt is unable to have lab draw to confirm INR level due to age, hospice, and frailty. She is aware to go to ED with any bleeding.

## 2019-12-30 ENCOUNTER — Telehealth: Payer: Self-pay | Admitting: Pharmacist

## 2019-12-30 DIAGNOSIS — I482 Chronic atrial fibrillation, unspecified: Secondary | ICD-10-CM | POA: Diagnosis not present

## 2019-12-30 DIAGNOSIS — N390 Urinary tract infection, site not specified: Secondary | ICD-10-CM | POA: Diagnosis not present

## 2019-12-30 DIAGNOSIS — R634 Abnormal weight loss: Secondary | ICD-10-CM | POA: Diagnosis not present

## 2019-12-30 DIAGNOSIS — N184 Chronic kidney disease, stage 4 (severe): Secondary | ICD-10-CM | POA: Diagnosis not present

## 2019-12-30 DIAGNOSIS — I5032 Chronic diastolic (congestive) heart failure: Secondary | ICD-10-CM | POA: Diagnosis not present

## 2019-12-30 DIAGNOSIS — R609 Edema, unspecified: Secondary | ICD-10-CM | POA: Diagnosis not present

## 2019-12-30 NOTE — Telephone Encounter (Signed)
Daughter should be checking INR today - per Dr Marlou Porch, pt should stop warfarin.

## 2019-12-30 NOTE — Addendum Note (Signed)
Addended by: Marcelle Overlie D on: 12/30/2019 01:47 PM   Modules accepted: Orders

## 2019-12-30 NOTE — Telephone Encounter (Signed)
Daughter called- pt is no longer eating or drinking. They are stopping warfarin and will no longer be checking INR. Hospice to start morphine.

## 2019-12-30 NOTE — Telephone Encounter (Signed)
Jerline Pain, MD  Ruffin Lada, Harlon Flor, RPH-CPP Thanks for update   Let's stop the Warfarin   Candee Furbish, MD       Previous Messages   ----- Message -----  From: Leeroy Bock, RPH-CPP  Sent: 12/27/2019 11:16 AM EDT  To: Jerline Pain, MD   INR increased from 2.3 last week to > 8 this week on self-testing machine. Pt is immobile and on hospice and is unable to have venopuncture drawn to confirm INR. I'm giving her Vitamin K due to age/frailty combined with elevated INR but wanted to forward for your input. Daughter states pt has been eating less but reported nothing else out of the ordinary. Not sure at what point the benefit of warfarin outweighs the risk for her.  Thanks, [employer]

## 2019-12-30 NOTE — Telephone Encounter (Signed)
Daughter called stating patient is not eating or drinking. Hospice is stating morphine and they are stopping the warfarin, therefore they will not be checking her INR anymore.

## 2019-12-31 ENCOUNTER — Encounter (HOSPITAL_COMMUNITY): Payer: Medicare Other

## 2019-12-31 DIAGNOSIS — N184 Chronic kidney disease, stage 4 (severe): Secondary | ICD-10-CM | POA: Diagnosis not present

## 2019-12-31 DIAGNOSIS — I5032 Chronic diastolic (congestive) heart failure: Secondary | ICD-10-CM | POA: Diagnosis not present

## 2019-12-31 DIAGNOSIS — I482 Chronic atrial fibrillation, unspecified: Secondary | ICD-10-CM | POA: Diagnosis not present

## 2019-12-31 DIAGNOSIS — N390 Urinary tract infection, site not specified: Secondary | ICD-10-CM | POA: Diagnosis not present

## 2019-12-31 DIAGNOSIS — R634 Abnormal weight loss: Secondary | ICD-10-CM | POA: Diagnosis not present

## 2019-12-31 DIAGNOSIS — R609 Edema, unspecified: Secondary | ICD-10-CM | POA: Diagnosis not present

## 2019-12-31 NOTE — Telephone Encounter (Signed)
Agree with plan Dorena Dorfman, MD  

## 2020-01-01 DIAGNOSIS — R609 Edema, unspecified: Secondary | ICD-10-CM | POA: Diagnosis not present

## 2020-01-01 DIAGNOSIS — I482 Chronic atrial fibrillation, unspecified: Secondary | ICD-10-CM | POA: Diagnosis not present

## 2020-01-01 DIAGNOSIS — R634 Abnormal weight loss: Secondary | ICD-10-CM | POA: Diagnosis not present

## 2020-01-01 DIAGNOSIS — I5032 Chronic diastolic (congestive) heart failure: Secondary | ICD-10-CM | POA: Diagnosis not present

## 2020-01-01 DIAGNOSIS — N390 Urinary tract infection, site not specified: Secondary | ICD-10-CM | POA: Diagnosis not present

## 2020-01-01 DIAGNOSIS — N184 Chronic kidney disease, stage 4 (severe): Secondary | ICD-10-CM | POA: Diagnosis not present

## 2020-01-02 DIAGNOSIS — I482 Chronic atrial fibrillation, unspecified: Secondary | ICD-10-CM | POA: Diagnosis not present

## 2020-01-02 DIAGNOSIS — R609 Edema, unspecified: Secondary | ICD-10-CM | POA: Diagnosis not present

## 2020-01-02 DIAGNOSIS — N184 Chronic kidney disease, stage 4 (severe): Secondary | ICD-10-CM | POA: Diagnosis not present

## 2020-01-02 DIAGNOSIS — N390 Urinary tract infection, site not specified: Secondary | ICD-10-CM | POA: Diagnosis not present

## 2020-01-02 DIAGNOSIS — I5032 Chronic diastolic (congestive) heart failure: Secondary | ICD-10-CM | POA: Diagnosis not present

## 2020-01-02 DIAGNOSIS — R634 Abnormal weight loss: Secondary | ICD-10-CM | POA: Diagnosis not present

## 2020-01-08 DEATH — deceased

## 2020-07-26 IMAGING — CR DG HIP (WITH OR WITHOUT PELVIS) 2-3V*R*
3 series · 3 of 3 positions shown · non-contrast
Comparison: Reformats from abdominal CT 05/21/2017

CLINICAL DATA: [AGE] with right hip pain for 2 days. No known
injury.

EXAM:
DG HIP (WITH OR WITHOUT PELVIS) 2-3V RIGHT

[t pelvis a.p.]
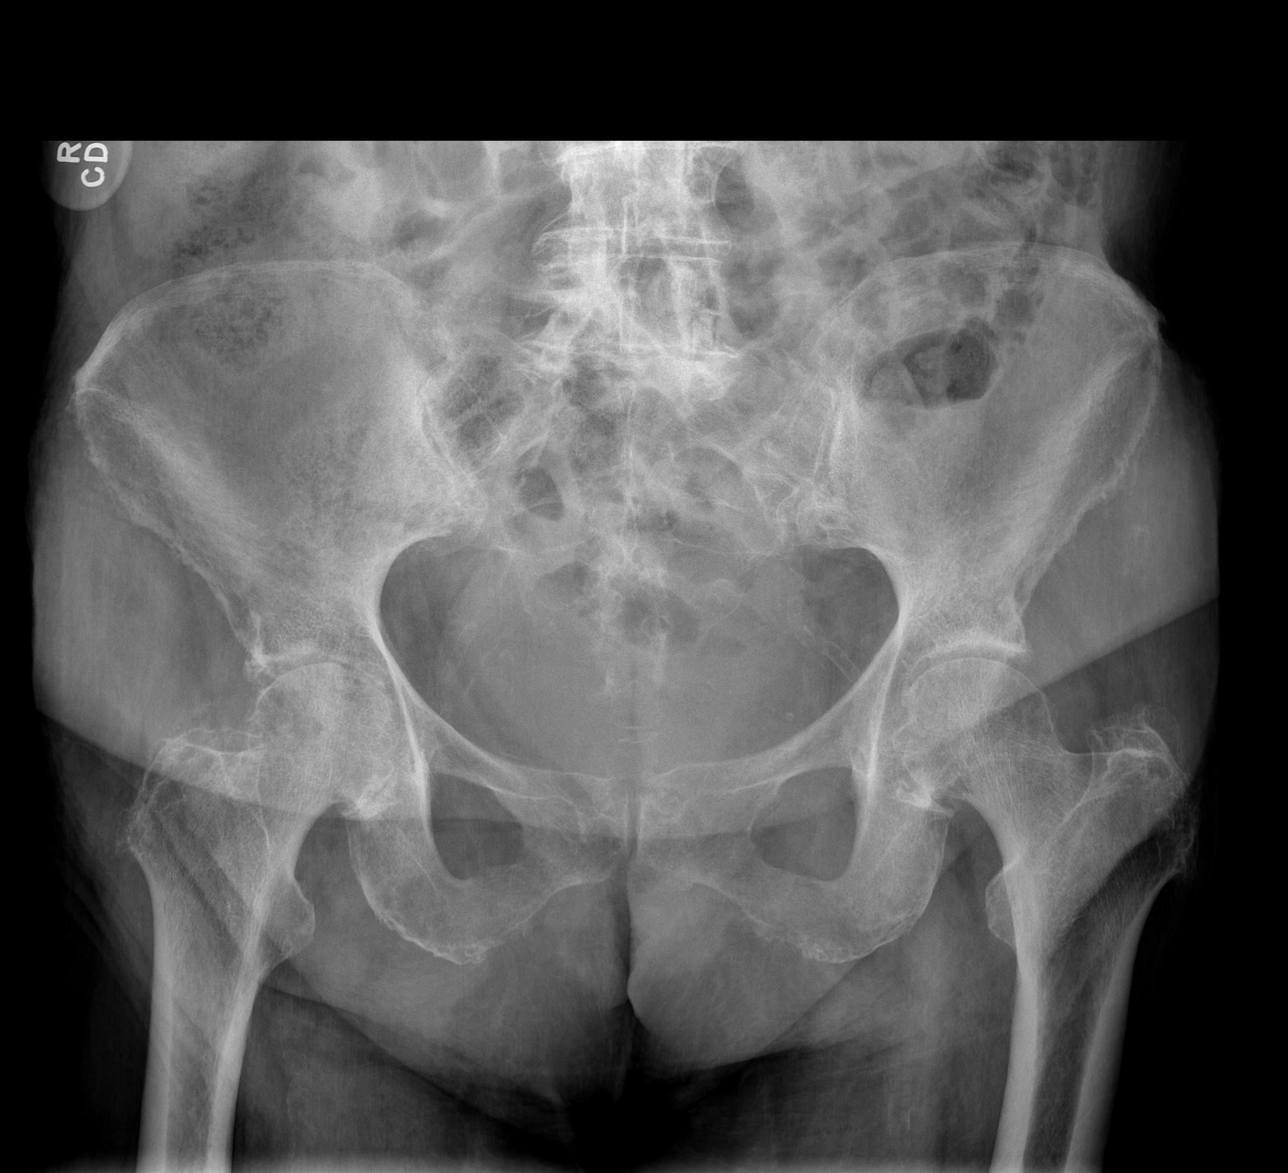

[t hip ap right]
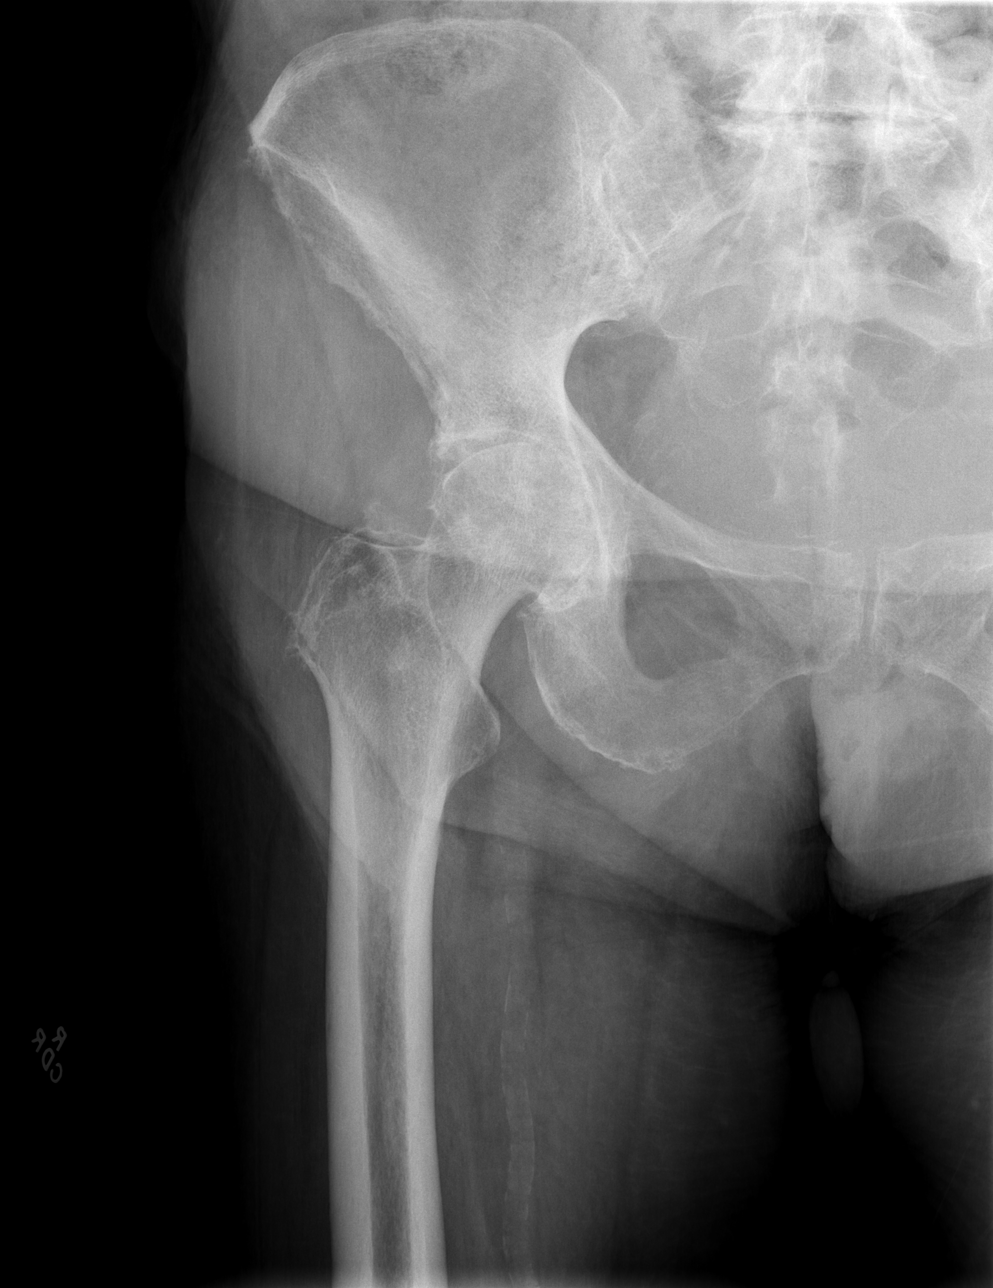

[t hip frog leg right]
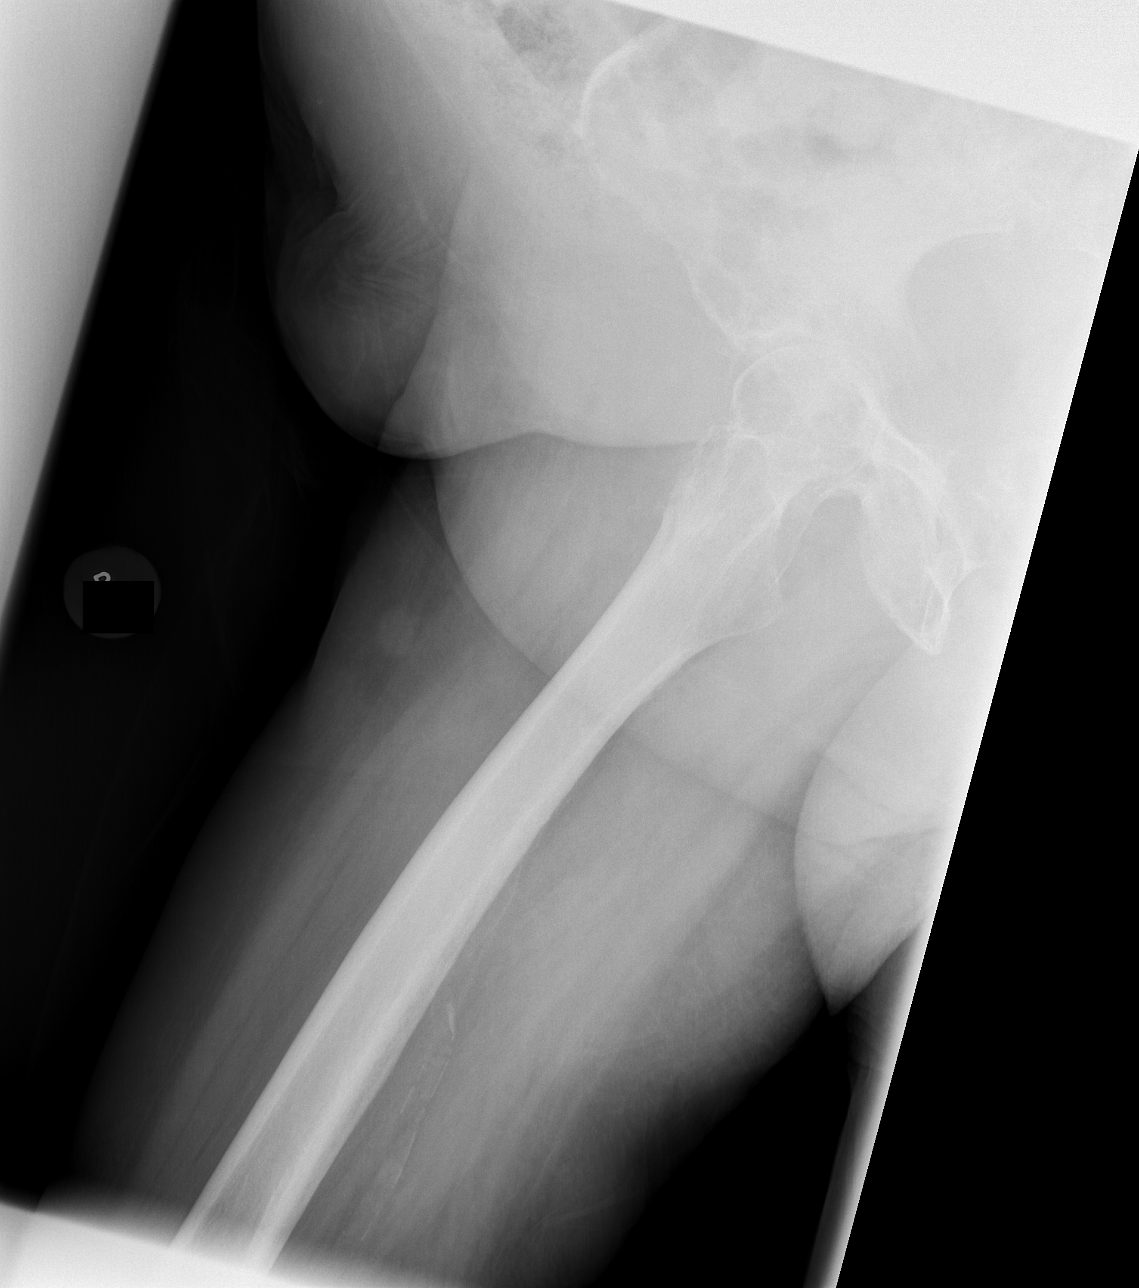

[3 of 3 positions shown; findings below may reference images not displayed]

FINDINGS: Advanced right hip osteoarthritis with joint space narrowing,
osteophytes, and subchondral cystic change. No radiographic evidence
of avascular necrosis. No fracture. Pubic rami are intact. No
evidence of focal bone lesion. Bones are diffusely under
mineralized. Mild left hip osteoarthritis.
IMPRESSION: Advanced right hip osteoarthritis.
